# Patient Record
Sex: Female | Born: 1970 | Race: White | Hispanic: No | Marital: Single | Smoking: Never smoker
Health system: Southern US, Community
[De-identification: ages and names within clinical notes are randomized; demographics above are authoritative.]

## PROBLEM LIST (undated history)

## (undated) DIAGNOSIS — I2699 Other pulmonary embolism without acute cor pulmonale: Secondary | ICD-10-CM

## (undated) DIAGNOSIS — N83202 Unspecified ovarian cyst, left side: Secondary | ICD-10-CM

## (undated) DIAGNOSIS — K5190 Ulcerative colitis, unspecified, without complications: Secondary | ICD-10-CM

## (undated) DIAGNOSIS — I10 Essential (primary) hypertension: Secondary | ICD-10-CM

## (undated) DIAGNOSIS — J45909 Unspecified asthma, uncomplicated: Secondary | ICD-10-CM

## (undated) DIAGNOSIS — N83201 Unspecified ovarian cyst, right side: Secondary | ICD-10-CM

## (undated) DIAGNOSIS — F1910 Other psychoactive substance abuse, uncomplicated: Secondary | ICD-10-CM

## (undated) HISTORY — DX: Other pulmonary embolism without acute cor pulmonale: I26.99

---

## 1998-08-10 ENCOUNTER — Ambulatory Visit (HOSPITAL_COMMUNITY): Admission: RE | Admit: 1998-08-10 | Discharge: 1998-08-10 | Payer: Self-pay | Admitting: *Deleted

## 2001-05-14 ENCOUNTER — Emergency Department (HOSPITAL_COMMUNITY): Admission: EM | Admit: 2001-05-14 | Discharge: 2001-05-14 | Payer: Self-pay | Admitting: Emergency Medicine

## 2001-05-14 ENCOUNTER — Encounter: Payer: Self-pay | Admitting: Emergency Medicine

## 2001-10-02 ENCOUNTER — Emergency Department (HOSPITAL_COMMUNITY): Admission: EM | Admit: 2001-10-02 | Discharge: 2001-10-02 | Payer: Self-pay | Admitting: Internal Medicine

## 2001-10-02 ENCOUNTER — Encounter: Payer: Self-pay | Admitting: Internal Medicine

## 2001-12-18 ENCOUNTER — Encounter: Payer: Self-pay | Admitting: Emergency Medicine

## 2001-12-18 ENCOUNTER — Emergency Department (HOSPITAL_COMMUNITY): Admission: EM | Admit: 2001-12-18 | Discharge: 2001-12-18 | Payer: Self-pay | Admitting: *Deleted

## 2002-02-12 ENCOUNTER — Emergency Department (HOSPITAL_COMMUNITY): Admission: EM | Admit: 2002-02-12 | Discharge: 2002-02-12 | Payer: Self-pay | Admitting: *Deleted

## 2003-04-16 ENCOUNTER — Encounter: Payer: Self-pay | Admitting: *Deleted

## 2003-04-17 ENCOUNTER — Inpatient Hospital Stay (HOSPITAL_COMMUNITY): Admission: EM | Admit: 2003-04-17 | Discharge: 2003-04-19 | Payer: Self-pay | Admitting: Emergency Medicine

## 2003-04-18 ENCOUNTER — Encounter: Payer: Self-pay | Admitting: *Deleted

## 2003-08-29 ENCOUNTER — Emergency Department (HOSPITAL_COMMUNITY): Admission: EM | Admit: 2003-08-29 | Discharge: 2003-08-29 | Payer: Self-pay | Admitting: Emergency Medicine

## 2004-01-15 ENCOUNTER — Emergency Department (HOSPITAL_COMMUNITY): Admission: EM | Admit: 2004-01-15 | Discharge: 2004-01-15 | Payer: Self-pay | Admitting: Emergency Medicine

## 2004-02-25 ENCOUNTER — Emergency Department (HOSPITAL_COMMUNITY): Admission: EM | Admit: 2004-02-25 | Discharge: 2004-02-25 | Payer: Self-pay | Admitting: Emergency Medicine

## 2004-06-15 ENCOUNTER — Inpatient Hospital Stay (HOSPITAL_COMMUNITY): Admission: EM | Admit: 2004-06-15 | Discharge: 2004-06-18 | Payer: Self-pay | Admitting: Emergency Medicine

## 2004-11-27 ENCOUNTER — Emergency Department (HOSPITAL_COMMUNITY): Admission: EM | Admit: 2004-11-27 | Discharge: 2004-11-28 | Payer: Self-pay | Admitting: Emergency Medicine

## 2006-07-05 ENCOUNTER — Emergency Department (HOSPITAL_COMMUNITY): Admission: EM | Admit: 2006-07-05 | Discharge: 2006-07-05 | Payer: Self-pay | Admitting: Emergency Medicine

## 2007-05-07 ENCOUNTER — Emergency Department (HOSPITAL_COMMUNITY): Admission: EM | Admit: 2007-05-07 | Discharge: 2007-05-07 | Payer: Self-pay | Admitting: Emergency Medicine

## 2007-05-15 ENCOUNTER — Emergency Department (HOSPITAL_COMMUNITY): Admission: EM | Admit: 2007-05-15 | Discharge: 2007-05-15 | Payer: Self-pay | Admitting: Emergency Medicine

## 2008-09-09 ENCOUNTER — Emergency Department (HOSPITAL_COMMUNITY): Admission: EM | Admit: 2008-09-09 | Discharge: 2008-09-09 | Payer: Self-pay | Admitting: Emergency Medicine

## 2008-09-14 ENCOUNTER — Emergency Department (HOSPITAL_COMMUNITY): Admission: EM | Admit: 2008-09-14 | Discharge: 2008-09-14 | Payer: Self-pay | Admitting: Emergency Medicine

## 2008-12-16 ENCOUNTER — Observation Stay (HOSPITAL_COMMUNITY): Admission: EM | Admit: 2008-12-16 | Discharge: 2008-12-18 | Payer: Self-pay | Admitting: Emergency Medicine

## 2009-06-07 ENCOUNTER — Emergency Department (HOSPITAL_COMMUNITY): Admission: EM | Admit: 2009-06-07 | Discharge: 2009-06-07 | Payer: Self-pay | Admitting: Emergency Medicine

## 2009-06-28 ENCOUNTER — Emergency Department (HOSPITAL_COMMUNITY): Admission: EM | Admit: 2009-06-28 | Discharge: 2009-06-28 | Payer: Self-pay | Admitting: Emergency Medicine

## 2010-11-04 ENCOUNTER — Encounter: Payer: Self-pay | Admitting: Family Medicine

## 2011-01-18 LAB — BASIC METABOLIC PANEL
Chloride: 99 mEq/L (ref 96–112)
Creatinine, Ser: 0.78 mg/dL (ref 0.4–1.2)
Potassium: 3.9 mEq/L (ref 3.5–5.1)
Sodium: 139 mEq/L (ref 135–145)

## 2011-01-18 LAB — URINALYSIS, ROUTINE W REFLEX MICROSCOPIC
Glucose, UA: NEGATIVE mg/dL
Ketones, ur: NEGATIVE mg/dL
Protein, ur: NEGATIVE mg/dL
Specific Gravity, Urine: <1.005 — ABNORMAL LOW (ref 1.005–1.030)

## 2011-01-18 LAB — CBC
MCHC: 34.1 g/dL (ref 30.0–36.0)
RBC: 4.51 MIL/uL (ref 3.87–5.11)
RDW: 14.6 % (ref 11.5–15.5)
WBC: 7.1 10*3/uL (ref 4.0–10.5)

## 2011-01-18 LAB — URINE MICROSCOPIC-ADD ON

## 2011-01-18 LAB — ETHANOL: Alcohol, Ethyl (B): 160 mg/dL — ABNORMAL HIGH (ref 0–10)

## 2011-01-18 LAB — RAPID URINE DRUG SCREEN, HOSP PERFORMED: Amphetamines: NOT DETECTED

## 2011-01-18 LAB — DIFFERENTIAL
Basophils Absolute: 0 10*3/uL (ref 0.0–0.1)
Basophils Relative: 1 % (ref 0–1)
Eosinophils Absolute: 0.6 10*3/uL (ref 0.0–0.7)
Eosinophils Relative: 8 % — ABNORMAL HIGH (ref 0–5)
Lymphs Abs: 2.5 10*3/uL (ref 0.7–4.0)
Monocytes Absolute: 0.4 10*3/uL (ref 0.1–1.0)
Monocytes Relative: 6 % (ref 3–12)

## 2011-01-24 LAB — GLUCOSE, CAPILLARY
Glucose-Capillary: 194 mg/dL — ABNORMAL HIGH (ref 70–99)
Glucose-Capillary: 217 mg/dL — ABNORMAL HIGH (ref 70–99)
Glucose-Capillary: 236 mg/dL — ABNORMAL HIGH (ref 70–99)

## 2011-01-24 LAB — CBC
HCT: 36.1 % (ref 36.0–46.0)
Hemoglobin: 12.1 g/dL (ref 12.0–15.0)
MCHC: 33.3 g/dL (ref 30.0–36.0)
MCV: 84.1 fL (ref 78.0–100.0)
MCV: 84.7 fL (ref 78.0–100.0)
RBC: 4.38 MIL/uL (ref 3.87–5.11)
RBC: 4.4 MIL/uL (ref 3.87–5.11)
RDW: 15.6 % — ABNORMAL HIGH (ref 11.5–15.5)
RDW: 15.8 % — ABNORMAL HIGH (ref 11.5–15.5)
RDW: 15.9 % — ABNORMAL HIGH (ref 11.5–15.5)
WBC: 27.5 10*3/uL — ABNORMAL HIGH (ref 4.0–10.5)
WBC: 9.5 10*3/uL (ref 4.0–10.5)

## 2011-01-24 LAB — BASIC METABOLIC PANEL
BUN: 5 mg/dL — ABNORMAL LOW (ref 6–23)
BUN: 7 mg/dL (ref 6–23)
Calcium: 8.9 mg/dL (ref 8.4–10.5)
Chloride: 98 mEq/L (ref 96–112)
Chloride: 99 mEq/L (ref 96–112)
Creatinine, Ser: 0.59 mg/dL (ref 0.4–1.2)
Creatinine, Ser: 0.73 mg/dL (ref 0.4–1.2)
GFR calc Af Amer: >60 mL/min (ref >60)
GFR calc non Af Amer: >60 mL/min (ref >60)
Glucose, Bld: 172 mg/dL — ABNORMAL HIGH (ref 70–99)
Glucose, Bld: 181 mg/dL — ABNORMAL HIGH (ref 70–99)
Potassium: 3.6 mEq/L (ref 3.5–5.1)
Potassium: 3.9 mEq/L (ref 3.5–5.1)
Potassium: 4.7 mEq/L (ref 3.5–5.1)
Sodium: 137 mEq/L (ref 135–145)

## 2011-01-24 LAB — DIFFERENTIAL
Band Neutrophils: 2 % (ref 0–10)
Basophils Absolute: 0 10*3/uL (ref 0.0–0.1)
Basophils Absolute: 0 10*3/uL (ref 0.0–0.1)
Basophils Relative: 0 % (ref 0–1)
Eosinophils Absolute: 0 10*3/uL (ref 0.0–0.7)
Eosinophils Absolute: 0 10*3/uL (ref 0.0–0.7)
Eosinophils Relative: 0 % (ref 0–5)
Eosinophils Relative: 1 % (ref 0–5)
Lymphocytes Relative: 3 % — ABNORMAL LOW (ref 12–46)
Metamyelocytes Relative: 0 %
Monocytes Absolute: 0.1 10*3/uL (ref 0.1–1.0)
Monocytes Absolute: 0.2 10*3/uL (ref 0.1–1.0)
Monocytes Absolute: 1.1 10*3/uL — ABNORMAL HIGH (ref 0.1–1.0)
Monocytes Relative: 4 % (ref 3–12)
Neutro Abs: 13.3 10*3/uL — ABNORMAL HIGH (ref 1.7–7.7)
Neutro Abs: 9.1 10*3/uL — ABNORMAL HIGH (ref 1.7–7.7)
Neutrophils Relative %: 95 % — ABNORMAL HIGH (ref 43–77)

## 2011-01-24 LAB — HEMOGLOBIN A1C
Hgb A1c MFr Bld: 5.9 % (ref 4.6–6.1)
Mean Plasma Glucose: 123 mg/dL

## 2011-02-26 NOTE — Discharge Summary (Signed)
Melanie Mendoza, Melanie Mendoza                 ACCOUNT NO.:  [id number]   MEDICAL RECORD NO.:  [id number]          PATIENT TYPE:  OBV   LOCATION:  A318                          FACILITY:  [facility]   PHYSICIAN:  Osvaldo Shipper, MD     DATE OF BIRTH:  18-Feb-1971   DATE OF ADMISSION:  12/16/2008  DATE OF DISCHARGE:  03/07/2010LH                               DISCHARGE SUMMARY   PRIVATE MEDICAL DOCTOR:  Dr. Reynolds Bowl   DISCHARGE DIAGNOSES:  1. Acute asthma exacerbation, improved.  2. History of chronic back pains.  3. Anxiety disorder.   Please review H&P dictated at the time of admission for details  regarding the patient's presenting illness.   BRIEF HOSPITAL COURSE:  Briefly, this is a 39 year old Caucasian female  who was visiting family and friends and was exposed to a lot of  cigarette smoking and kerosine fumes and, as a result, started feeling  short of breath and started wheezing and developed severe cough.  The  patient was evaluated in the ED and was found to be significantly  wheezing and was hypoxic in the 80s.  Chest x-ray did not show any acute  abnormalities, but did show evidence of COPD and emphysema.  She was  given nebulizer treatments, steroids, antibiotics, antitussive agents  and has significantly improved.  Her peak flows initially were 100 and  is now they are about 250.  Her lungs are sounding much better.  She is  oxygenating about 93% on room air.  She is feeling much better  symptomatically.   Her CBG's did go up while she was on the steroids to about 257.  Her  initial lab showed a glucose of 172 but that was also done after she was  given steroids.  I have explained to her that she needs to have blood  work done off her steroids to make sure she does not have diabetes or  early diabetes.   On the day of discharge, the patient is feeling well.  No complaints are  offered cough is better.  Vital signs are all stable.  Lungs: Clear to  auscultation bilaterally.   Cardiovascular system:  S1 and S2 normal,  regular.  Abdomen: Soft.   Labs are still pending from this morning, so once labs come back okay  she can be discharged home.   DISCHARGE MEDICATIONS:  1. Prednisone 40 mg daily for 3 days followed by 20 mg daily for 3      days, followed by 10 mg daily for 3 days.  2. Zithromax 500 mg daily for 3 more days.  3. Tussionex 5 cc q.12 hours for 5 days.  4. Continue with albuterol inhaler as needed q.4 hours.  5. Continue with Percocet t.i.d. for chronic back pain.   FOLLOW UP:  With Dr. Jorene Guest in 1 week.  Also have PMD check blood  sugars off the steroids.   DIET:  As before.   PHYSICAL ACTIVITY:  As before.   CONSULTATIONS:  None obtained during this admission.   Total time on this discharge encounter greater than 30 minutes.  Osvaldo Shipper, MD  Electronically Signed     GK/MEDQ  D:  12/18/2008  T:  12/18/2008  Job:  [id number]   cc:   Reynolds Bowl, MD  [place], [place]

## 2011-02-26 NOTE — H&P (Signed)
Melanie Mendoza, Melanie Mendoza                 ACCOUNT NO.:  [id number]   MEDICAL RECORD NO.:  [id number]          PATIENT TYPE:  OBV   LOCATION:  A318                          FACILITY:  [facility]   PHYSICIAN:  Osvaldo Shipper, MD     DATE OF BIRTH:  May 14, 1971   DATE OF ADMISSION:  12/16/2008  DATE OF DISCHARGE:  LH                              HISTORY & PHYSICAL   PRIMARY CARE PHYSICIAN:  Dr. Jorene Guest.   ADMISSION DIAGNOSES:  1. Acute asthma exacerbation.  2. History of chronic back pain.  3. History of anxiety disorder.   CHIEF COMPLAINT:  Shortness of breath for the past two days.   HISTORY OF PRESENT ILLNESS:  The patient is a 40 year old Caucasian  female who has a history of asthma, chronic back pain, who was in her  usual state of health until about 2 days ago when she started feeling  short of breath.  She is currently staying in [place] and she was  visiting her other family members here in [place] and she was exposed  to cigarette smoke and kerosene stoves and as a result, she started  getting short of breath.  The symptoms got worse yesterday but she  decided not to come in to the hospital.  She was wheezing, experiencing  chest tightness, coughing quite a bit with clear expectoration.  Today  the symptoms got to a point where she decided to come into  the  hospital.  Denied any fever or chills.  No specific chest pain. Chest  tightness because of cough and shortness of breath.  She denies smoking  at this time.  Denies any nausea or vomiting.   No sick contacts. No travel outside this area.   HOME MEDICATIONS:  1. Percocet 7.5 t.i.d..  2. Albuterol inhaler as needed.  3. She used to be on Xanax but her current PMD has not prescribed it      to her in awhile.   ALLERGIES:  No known drug allergies.   PAST MEDICAL HISTORY:  1. Asthma.  2. Chronic back pain.  3. Anxiety attacks.   PAST SURGICAL HISTORY:  1. Dilatation and curettage for miscarriage.  2. Abscess on her  buttocks.   SOCIAL HISTORY:  Lives in [place]. No smoking or alcohol on a regular  basis. No illicit drug use.  She does drink occasionally.  Works  delivering parts for automobiles.  Otherwise no other significant social  history.   FAMILY HISTORY:  Father with lung cancer.   REVIEW OF SYSTEMS:  GENERAL:  Positive for weakness, malaise. HEENT:  Unremarkable. CARDIOVASCULAR:  Unremarkable.  RESPIRATORY:  As in HPI.  GI: Unremarkable.  GU: Unremarkable.  NEUROLOGIC: Unremarkable.  PSYCHIATRIC:  Unremarkable.  DERMATOLOGIC:  Unremarkable.  MUSCULOSKELETAL: Unremarkable.  Other systems  unremarkable.   PHYSICAL EXAMINATION:  VITAL SIGNS:  Temperature 97.8, blood pressure  123/66, heart rate 95, respiratory rate initially 30, currently 20.  Room air saturation 86%, 94% on 2 L.  GENERAL:  This an overweight, obese, white female in no distress.  HEENT:  No pallor, no icterus.  Oral  mucous membranes are moist.  No  oral lesions were seen.  NECK:  Soft and supple. No thyromegaly is appreciated.  LUNGS:  Diffuse e0nd-expiratory wheezes bilaterally.  No crackles  present.  CARDIOVASCULAR:  S1 and S2 normal, regular.  No murmurs appreciated.  No  S3 or S4 seen.  No rubs, no bruits.  ABDOMEN:  Soft, nontender, nondistended.  Bowel sounds present. No  masses or organomegaly appreciated.  EXTREMITIES:  No edema.  Peripheral pulses are palpable.  NEUROLOGIC:  She is alert, oriented x3.  No focal neurological deficits  are present.   LABORATORY DATA:  No labs have been done.  Chest x-ray shows evidence  for emphysema, prominent central pulmonary arteries.  May reflect  pulmonary artery hypertension.   ASSESSMENT:  This is a 40 year old Caucasian female who presents with  acute asthma exacerbation secondary to exposure to cigarette smoke.  She  is improved after treatment in the ED.  However, continuous to be  hypoxic and still wheezing quite a bit and needs to be observed in the  hospital  for some intensive steroids as well as nebulizer treatment.   PLAN:  1. Acute asthma exacerbation. Will be admitted, nebulizer q.4 h.      Steroids will be given IV. Z-pak will be provided. Peak flows will      be checked.  Since we do not have any labs, we will go ahead and      check a CBC and a BMET.  At this time we do not need an ABG.  Room      air saturation will be checked on a daily basis.  CBGs will be      checked while she is on the steroids.  Tessalon Perles will be      utilized for cough.  Chest tightness is most likely the result of      asthma.  2. Chronic back pain.  Continue with Percocet.  3. Anxiety disorder.  Continue with Xanax.  She is requesting an agent      for insomnia so we will go ahead and      prescribe Ambien.  4. Deep venous thrombosis prophylaxis with Lovenox.   Further management will depend on the results of further testing and the  patient's response to treatment.      Osvaldo Shipper, MD  Electronically Signed     GK/MEDQ  D:  12/16/2008  T:  12/17/2008  Job:  [id number]   cc:   Dr. Jorene Guest

## 2011-03-01 NOTE — H&P (Signed)
NAME:  Melanie Mendoza, Melanie Mendoza NO.:  [id number]   MEDICAL RECORD NO.:  [id number]                  PATIENT TYPE:   LOCATION:                                       FACILITY:   PHYSICIAN:  Langley Gauss, M.D.                DATE OF BIRTH:   DATE OF ADMISSION:  04/17/2003  DATE OF DISCHARGE:                                HISTORY & PHYSICAL   HISTORY OF PRESENT ILLNESS:  This is a 40 year old gravida 2, para 2, two  prior vaginal deliveries with a last menstrual period of mid-May who  presented to the emergency room complaining of a three-day duration of  pelvic pain primarily on the left but acutely increasing at 1300 on today's  date with a generalization towards the midepigastric area.  The patient knew  that she was late for her menses which were described as further  unpredictable.  She is not on any birth control.  However, she was unaware  and only had a positive pregnancy test just on today's visit, April 17, 2003.  When the patient initially presented to the emergency room according to the  triage record, she described the pain as 10/10.  When I was consulted to see  her after having been seen by Dr. Ernestina Penna, the patient had been treated with  IV morphine with marked improvement in her pain.  She denied any vaginal  bleeding or change in her vaginal discharge.  Has not been on any birth  control.  She denies any drug or alcohol use during this pregnancy.   PAST MEDICAL HISTORY:  The patient does have history of panic attacks,  asthmatic bronchitis.  She is a nonsmoker.  Has previously been treated with  a Proventil inhaler.  Review of the patient's previous medical record in our  office reveals that during her last pregnancy six years previously, the  patient repeatedly tested positive for cocaine, THC, opiates, and  occasionally amphetamines.  Medical history is pertinent for two prior  vaginal deliveries.   ALLERGIES:  She has no known drug  allergies.   CURRENT MEDICATIONS:  The patient states none.   PHYSICAL EXAMINATION:  GENERAL:  In no acute distress.  Appears much greater  than stated age of 20.  VITAL SIGNS:  Weight 135, blood pressure 126/83, pulse of 96, respiratory  rate 20, temperature 99.5.  HEENT:  Negative.  No adenopathy.  NECK:  Supple.  Thyroid is nonpalpable.  LUNGS:  Clear.  CARDIOVASCULAR:  Regular rate and rhythm.  ABDOMEN:  Soft, nontender, nondistended.  No peritoneal signs are  identified.  No surgical scars are seen.  PELVIC:  Reveals a brownish discharge only.  No lesions on the external  vulva.  No definite vaginal bleeding noted.  Bimanual examination reveals  moderate tenderness both adnexal region as well as within the manipulation  of the uterus itself.   LABORATORY DATA:  A urinalysis is unremarkable.  Hemoglobin 12.9, hematocrit  37.2, white count 8.6.  hCG quantitative is 1207.  Electrolytes:  Sodium  132, potassium 3.2, chloride 103, CO2 29.  Urine drug screen is positive for  cocaine, positive for opiates, positive for benzodiazepines.  Transvaginal  ultrasound performed in Department of Radiology reveals no intrauterine  pregnancy identified.  There is a moderate amount of free fluid seen within  the pelvis.  The right ovary is interpreted as being normal and the left  ovary is identified as a left ovarian cyst at 5.8 cm in diameter.   ASSESSMENT:  Probable ectopic pregnancy and most pertinent findings being  that of quantitative hCG of 1207, marked pelvic pain acutely worsening, and  the ultrasound which fails to reveal an intrauterine pregnancy.  Pertinent,  however, is the free fluid identified on the ultrasound which probably  represents intraperitoneal bleeding.  The left ovarian cyst appears benign  in appearance being 5.8 cm in diameter with no complex characteristics, no  echogenicities, and no septations.  This may or not be associated with the  presumed ectopic pregnancy  but the other findings are much more conclusive.  Pertinently, the absence of an intrauterine pregnancy identified.   PLAN:  The patient is clinically stable at present.  She has had complete  cessation of pain.  Hemodynamically stable with a hemoglobin of 12.9.  It is  now April 18, 2003 at 0030 hours.  It is in the best interest of the patient  to have completion of workup and paperwork performed at this time and the  laparoscopic excision of ectopic pregnancy found to be performed at 0700.  Risks and benefits of the laparoscopic procedure discussed with the patient.  She understands that there is the possibility of removal of fallopian tube  and/or ovary dependant on clinical findings.  The patient does state a  desire to have her tubes tied.  However, I mentioned to her that this is not  to be performed during an emergency procedure nor has she made the  appropriate preoperative counseling visitations for this required procedure,  and if she does qualify for Medicaid there would be a mandatory 30-day  waiting period.  She is, however, certain of her desire to have no future  childbearing opportunities.                                               Langley Gauss, M.D.    DC/MEDQ  D:  04/18/2003  T:  04/18/2003  Job:  [id number]

## 2011-03-01 NOTE — Op Note (Signed)
NAME:  Melanie Mendoza, Melanie Mendoza                           ACCOUNT NO.:  [id number]   MEDICAL RECORD NO.:  [id number]                   PATIENT TYPE:  PINP   LOCATION:                                       FACILITY:  [facility]   PHYSICIAN:  Langley Gauss, M.D.                DATE OF BIRTH:  04-22-1971   DATE OF PROCEDURE:  04/17/2003  DATE OF DISCHARGE:                                 OPERATIVE REPORT   PREOPERATIVE DIAGNOSIS:  Ruptured ectopic pregnancy.   POSTOPERATIVE DIAGNOSIS:  Ruptured ectopic pregnancy, specifically findings  of mid ampullary portion right fallopian tube, ectopic pregnancy with  evidence of a small amount of previous bleeding.  In addition a 6 cm  follicular cyst identified within the left ovary.  The uterus itself is  noted to be diffusely enlarged consistent with multiparous patient.   PROCEDURE:  1. Laparoscopy with right salpingectomy.  2. Right ovarian cystectomy.  3. Left ovarian cystectomy.   SURGEON:  Langley Gauss, M.D.   ESTIMATED BLOOD LOSS:  Less than 100 ml.   SPECIMENS:  Specimens for a permanent section only to include cyst wall,  left and right ovary as well as right fallopian tube containing ectopic  pregnancy.   ANESTHESIA:  General endotracheal.   COMPLICATIONS:  None.   DESCRIPTION OF PROCEDURE:  The patient was taken from the fourth floor to  the operating room.  Vital signs were stable.  She underwent uncomplicated  induction of general endotracheal anesthesia.  She was then placed in the  dorsal lithotomy position, prepped and draped in the usual sterile manner.  Foley catheter had been previously placed to straight drainage.  A ring  forceps with a sponge stick and 4 x 4 attached was then placed into the  vaginal vault for manipulation.  I then changed to sterile gloves and on the  patient's left side a 1 cm vertical incision was made just superior to the  umbilicus.  In addition, a suprapubic midline incision was performed.  The  abdominal wall was then elevated.  The Veress needle was then passed through  the subumbilical incision angling perpendicular to the fascia plane to  atraumatically enter the peritoneal cavity.  Drop test then confirms a  proper intraperitoneal placement.  Pneumoperitoneum is then created by  insufflating 3.5 liters of CF2 gas at a filling pressure of less than 16  mmHg.  After pneumoperitoneum Veress needle was removed.  The Visiport 10 mm  trocar and sleeve are threaded through this subumbilical incision.  The  abdominal wall is elevated.  Under direct visualization, I was able to  easily and atraumatically enter the peritoneal cavity.  The scope was then  connected to video camera which allowed visualization of the pelvic  contents.  As stated previously, about 50 ml of clotted, non clotted blood  was noted within the posterior cul-de-sac.  Right ectopic pregnancy as  stated previously.  As stated previously, left tube normal in appearance,  bilateral ovarian cysts.  The patient had previously stated a desire for  permanent sterilization.  In addition, the right fallopian tube had more  than 1/2 of the tube involved in the ectopic pregnancy.  Thus decision was  made proceed with a left salpingectomy.  The right ovary was grasped with a  Babcock clamp and elevated which allowed completely visualization and  manipulation of the right fallopian tube as well as the ectopic pregnancy.  This then allows me to use the 45 mm standard Endo-GIA clamp to clamp the  medial salpinx which then allows with a single firing of the Endo-GIA to  completely excise the right fallopian tube and associated ectopic pregnancy.  During manipulation of the right ovary, rupture of the right ovarian cyst  was noted to occur, portions which were then sent for permanent section  only.  The vascular pedicle was examined.  There is noted to be a small  pulsatile bleeder noted along the staple line.  This was easily  cauterized  utilizing bipolar cautery to achieve hemostasis.  The specimen was then to  be removed.  A 12 mm trocar and sleeve had been inserted through a third  puncture site to the left of the midline under direct visualization.  An  Endo-catch was passed through this third trocar site which allowed the  specimen not to be passed into the sac and completely removed as a single  unit.  This was then sent off for a permanent specimen only.  The right  ovary was then examined.  A small amount of active bleeding at the ovarian  cyst capsule at the level of the cyst removal were noted to be bleeding.  These were easily controlled utilizing bipolar cautery.  I then turned my  attention to the left adnexa to see the large size of the left follicular  cyst estimated to be about 6 cm in size and no active bleeding was  presented.  I decided to remove this left ovarian cyst.  Cauterization was  then performed utilizing unipolar Endoshears over the ovarian capsule which  then allowed me to identify the underlying cyst wall which was then  dissected free from the underlying capsule and most of the ovarian cyst wall  was then excised and sent off for permanent specimen only.  Bipolar cautery  is performed of the cyst capsulotomy left to achieve hemostasis.  Irrigation  was performed of the pelvic cavity to assure hemostasis.  No active bleeding  is noted.  A piece of Surgicel oil and cloth is placed around the right  ovary as well as the left ovary in an effort to prevent postoperative aging  formation.  The procedure was then terminated.  Gas was allowed to escape.  All instruments are removed.  The incisions are closed.  A 2-0 Vicryl suture  was used to close the fascia of the 12 mm trocar site on the left.  In  addition, two interrupted sutures of 2-0 Vicryl were placed subumbilically  to reapproximate the skin edges here.  The second and third puncture sites were then completely closed utilizing  skin staples.  A total of 20 ml of  0.5% bupivacaine was then injected subcutaneously through a small skin wheal  to facilitate postoperative analgesia.  The patient was reversed of  anesthesia and taken to the recovery room in stable condition.  A Foley  catheter continues to drain clear yellow  urine.  The patient continues to  have stable vital signs.  Operative findings were discussed with the  patient's awaiting family to include the fact that the right ovary had been  removed due to the large size of the ectopic pregnancy.  The patient will be  referred to the fourth floor, continued on an observation status with  planned probable discharge on April 18, 2003.                                               Langley Gauss, M.D.    DC/MEDQ  D:  04/17/2003  T:  04/17/2003  Job:  [id number]

## 2011-03-01 NOTE — Discharge Summary (Signed)
NAME:  TANETTE, CHAUCA                           ACCOUNT NO.:  [id number]   MEDICAL RECORD NO.:  [id number]                   PATIENT TYPE:  INP   LOCATION:  A319                                 FACILITY:  [facility]   PHYSICIAN:  Dalia Heading, M.D.               DATE OF BIRTH:  23-Nov-1970   DATE OF ADMISSION:  06/15/2004  DATE OF DISCHARGE:  06/18/2004                                 DISCHARGE SUMMARY   HISTORY OF PRESENT ILLNESS:  The patient is a 40 year old white female who  presented to the emergency room with a peri-rectal abscess. A surgical  consultation was obtained and the patient was admitted to the hospital for  further evaluation and treatment. She underwent incision and drainage of the  peri-rectal abscess on June 16, 2004. She tolerated the procedure well.  Postoperative course has been remarkable only for adjusting pain  medications. Her white blood cell count has returned to normal. Culture  results are still pending.   DISPOSITION:  The patient is being discharged home on postoperative day 2 in  good and stable condition.   FOLLOW UP:  The patient is to followup with Dr. Franky Macho on June 21, 2004.   DISCHARGE MEDICATIONS:  1.  Restoril 30 mg p.o. q.h.s. p.r.n. insomnia.  2.  Lortab and Flagyl from my office.   SPECIAL INSTRUCTIONS:  She is to soak in a tub twice a day.   PRINCIPAL DIAGNOSIS:  Peri-rectal abscess.   PRINCIPAL PROCEDURE:  Incision and drainage of peri-rectal abscess on  June 16, 2004.     ___________________________________________                                         Dalia Heading, M.D.   MAJ/MEDQ  D:  06/18/2004  T:  06/18/2004  Job:  [id number]

## 2011-03-01 NOTE — Discharge Summary (Signed)
NAME:  Melanie Mendoza, Melanie Mendoza                           ACCOUNT NO.:  [id number]   MEDICAL RECORD NO.:  [id number]                   PATIENT TYPE:  INP   LOCATION:  A417                                 FACILITY:  [facility]   PHYSICIAN:  Langley Gauss, M.D.                DATE OF BIRTH:  21-Mar-1971   DATE OF ADMISSION:  04/16/2003  DATE OF DISCHARGE:  04/19/2003                                 DISCHARGE SUMMARY   DIAGNOSES:  1. Ruptured right ampullary ectopic pregnancy.  2. A 6 cm follicular cyst, left ovary.   PROCEDURE PERFORMED:  Laparoscopy with:  1. Right salpingectomy with removal ectopic pregnancy.  2. Left ovarian cystectomy.  3. Right ovarian cystectomy.   DISPOSITION:  At time of discharge the patient does have staples in place at  the laparoscopic site.  She will follow up in the office in two days time  for staple removal.  She is given a copy of standardized discharge  instructions at time of discharge.   DISCHARGE MEDICATIONS:  1. Tylox #30 with no refill for pain relief.  2. In addition, the patient is to continue with Keflex 500 mg p.o. q.i.d. x5     days for a left lower jaw problem with tooth decay.   PERTINENT LABORATORY STUDIES:  O positive blood type.  Hemoglobin and  hematocrit 12.9/37.2 with a white count of 8.6.  Postoperative day #1,  hemoglobin 10.7, hematocrit 30.7, with a white count of 10.0.  Pertinently,  on the patient's initial urine drug screen obtained in the emergency room  for medical purposes, she was noted to test positive for cocaine, positive  for benzodiazepines, and positive for opiates.  Additional studies:  The  patient did have a chest x-ray obtained postoperatively for findings of  diffuse rhonchi; chest x-ray was interpreted as normal.  Ultrasound obtained  by the emergency room physician on April 16, 2003 revealed a 5.8 cm cyst on  the left ovary; the right ovary was interpreted as normal; free fluid within  the pelvis; no intrauterine  pregnancy identified.  Quantitative beta hCG  returned at 1207.   HOSPITAL COURSE:  The patient presented to the [facility] emergency room on  April 16, 2003 with new finding of a positive pregnancy test, left lower  quadrant pain, and the ultrasound as described previously.  The patient was  initially seen by Dr. Ernestina Penna, the emergency room physician, at which time I  was consulted for ongoing care and management.  I evaluated the patient the  late p.m. of April 16, 2003 continuing into the early a.m. of April 17, 2003.  My diagnosis was that of probable ectopic pregnancy.  The patient was thus  taken to the operating room on April 17, 2003 at which time the described  operative laparoscopy was performed without complications.  A right  salpingectomy was performed as well as left and right  ovarian cystectomies.  Postoperatively the patient did well.  She had a Foley catheter in place  following surgery.  The Foley catheter was actually removed the p.m. of  surgery.  She had excellent urine output.  Vital signs remained stable.  The  patient was very bloated with no passage of flatus.  On postoperative day  #1 the patient became increasingly bloated with no passage of gas.  She did,  however, have bowel sounds.  She continued on the IV Buprenex for pain  relief and this did not change until the a.m. of postoperative day #2 at  which time the patient was changed to p.o. Tylox for pain relief.  She did  well on the p.o. Tylox.  She did have a Tmax of 102.8 the afternoon of  postoperative day #1.  White count was normal at 10.0.  Physical examination  was pertinent for the diffuse rhonchi in the lungs as well as a small  abscess in the left lower jaw associated with tooth decay, and some bruising  on the abdomen consistent with a probable limited subcutaneous hematoma  within the subcutaneous fatty tissue.  The patient was started on IV  Rocephin at that time.  She would have received two doses of IV  Rocephin  prior to discharge.  The patient defervesced very rapidly on this.  On  evaluation on postoperative day #2 she was fully ambulatory, tolerating a  regular general diet, passing flatus, remained afebrile.  Lungs were clear  to auscultation.  She was liberally using the Proventil inhaler which had  been prescribed for her.  The patient was thus discharged to home on  postoperative day #2 in a markedly-improved condition.  The final pathology  is currently pending and will be discussed with the patient at the  postoperative visit.                                               Langley Gauss, M.D.    DC/MEDQ  D:  04/19/2003  T:  04/19/2003  Job:  [id number]

## 2011-03-01 NOTE — H&P (Signed)
NAME:  Melanie Mendoza, Melanie Mendoza                           ACCOUNT NO.:  [id number]   MEDICAL RECORD NO.:  [id number]                   PATIENT TYPE:  INP   LOCATION:  A319                                 FACILITY:  [facility]   PHYSICIAN:  Dalia Heading, M.D.               DATE OF BIRTH:  [birth year]   DATE OF ADMISSION:  06/15/2004  DATE OF DISCHARGE:                                HISTORY & PHYSICAL   REASON FOR ADMISSION:  Perirectal cellulitis.   HISTORY OF PRESENT ILLNESS:  The patient is a 40 year old white female who  presents with a 24-hour history of worsening left buttock pain.  She  presented to the emergency room for further evaluation and treatment.   PAST MEDICAL HISTORY:  Asthma.   PAST SURGICAL HISTORY:  Unremarkable.   CURRENT MEDICATIONS:  Albuterol, metered dose.   ALLERGIES:  No known drug allergies.   REVIEW OF SYSTEMS:  Noncontributory other than the patient does smoke.   PHYSICAL EXAMINATION:  GENERAL: The patient is a well-developed, well-  nourished white female who is in pain from the left buttock.  She is  afebrile.  VITAL SIGNS: Stable.  LUNGS:  Clear to auscultation with equal breath sounds bilaterally.  HEART:  Examination reveals a regular rate and rhythm without S3, S4 or  murmurs.  RECTAL:  Examination reveals a left buttock which is indurated and  erythematous.  There is no specific fluctuance noted.  This does not at this  time appear to be connected to the rectum, though it is inferior from the  pilonidal cyst region.  No drainage is noted.   LABORATORY DATA:  Pending.   IMPRESSION:  Left buttocks cellulitis, question early abscess.   PLAN:  1.  The patient will be admitted to the hospital for intravenous antibiotics      and pain control.  She may require incision and drainage of this abscess      once it is better demarcated.     ___________________________________________                                         Dalia Heading, M.D.   MAJ/MEDQ  D:  06/15/2004  T:  06/15/2004  Job:  [id number]

## 2011-03-01 NOTE — Op Note (Signed)
NAME:  Melanie Mendoza, Melanie Mendoza                           ACCOUNT NO.:  [id number]   MEDICAL RECORD NO.:  [id number]                   PATIENT TYPE:  INP   LOCATION:  A319                                 FACILITY:  [facility]   PHYSICIAN:  Dalia Heading, M.D.               DATE OF BIRTH:  [birth year]   DATE OF PROCEDURE:  06/16/2004  DATE OF DISCHARGE:                                 OPERATIVE REPORT   PREOPERATIVE DIAGNOSIS:  Perirectal abscess.   POSTOPERATIVE DIAGNOSIS:  Perirectal abscess.   PROCEDURE:  Incision and drainage of perirectal abscess.   SURGEON:  Dr. Franky Macho   ANESTHESIA:  General.   INDICATIONS:  The patient is a 40 year old white female, who presented  yesterday evening to the emergency room with a perirectal abscess.  She does  have a leukocytosis.  She now presents to the operating room for an incision  and drainage of a perirectal abscess.  The risks and benefits of the  procedure were fully explained to the patient, who gave informed consent.   PROCEDURE NOTE:  The patient was placed in the Trendelenburg position after  general anesthesia was administered.  The perineum was prepped and draped  using the usual sterile technique with Betadine.  Surgical site confirmation  was performed.   An incision was made into the abscess which was greater than 3 cm from the  anus.  An abscess pocket was found.  Aerobic and anaerobic cultures were  taken and sent to microbiology.  The wound was irrigated with normal saline.  The mucosa of the rectum was inflamed, but no direct fistulous tract was  noted.  Hemorrhoidal disease was present diffusely.  The wound, which was  along the left perirectal area at the 5 o'clock position was packed with  Betadine and impregnated packing.  All tape and needle counts correct at the  end of the procedure.  The patient tolerated the procedure well and was  transferred back to PACU in stable condition.  Complications none.  Specimen:  Aerobic  and anaerobic cultures.  Blood loss minimal.      ___________________________________________                                            Dalia Heading, M.D.   MAJ/MEDQ  D:  06/16/2004  T:  06/17/2004  Job:  [id number]

## 2012-07-06 ENCOUNTER — Emergency Department (HOSPITAL_COMMUNITY): Payer: Self-pay

## 2012-07-06 ENCOUNTER — Encounter (HOSPITAL_COMMUNITY): Payer: Self-pay | Admitting: Emergency Medicine

## 2012-07-06 ENCOUNTER — Emergency Department (HOSPITAL_COMMUNITY)
Admission: EM | Admit: 2012-07-06 | Discharge: 2012-07-06 | Disposition: A | Discharge status: Home / Self Care | Payer: Self-pay | Attending: Emergency Medicine | Admitting: Emergency Medicine

## 2012-07-06 DIAGNOSIS — S161XXA Strain of muscle, fascia and tendon at neck level, initial encounter: Secondary | ICD-10-CM

## 2012-07-06 DIAGNOSIS — S0003XA Contusion of scalp, initial encounter: Secondary | ICD-10-CM | POA: Insufficient documentation

## 2012-07-06 DIAGNOSIS — S0083XA Contusion of other part of head, initial encounter: Secondary | ICD-10-CM

## 2012-07-06 DIAGNOSIS — S9031XA Contusion of right foot, initial encounter: Secondary | ICD-10-CM

## 2012-07-06 DIAGNOSIS — S9030XA Contusion of unspecified foot, initial encounter: Secondary | ICD-10-CM | POA: Insufficient documentation

## 2012-07-06 DIAGNOSIS — W108XXA Fall (on) (from) other stairs and steps, initial encounter: Secondary | ICD-10-CM | POA: Insufficient documentation

## 2012-07-06 DIAGNOSIS — S139XXA Sprain of joints and ligaments of unspecified parts of neck, initial encounter: Secondary | ICD-10-CM | POA: Insufficient documentation

## 2012-07-06 HISTORY — DX: Unspecified asthma, uncomplicated: J45.909

## 2012-07-06 MED ORDER — HYDROMORPHONE HCL PF 1 MG/ML IJ SOLN
1.0000 mg | Freq: Once | INTRAMUSCULAR | Status: AC
  Administered 2012-07-06: 1 mg via INTRAMUSCULAR
  Filled 2012-07-06: qty 1

## 2012-07-06 MED ORDER — OXYCODONE-ACETAMINOPHEN 5-325 MG PO TABS
2.0000 | ORAL_TABLET | Freq: Once | ORAL | Status: AC
  Administered 2012-07-06: 2 via ORAL
  Filled 2012-07-06: qty 2

## 2012-07-06 MED ORDER — OXYCODONE-ACETAMINOPHEN 5-325 MG PO TABS: 1.0000 | ORAL_TABLET | ORAL | Status: DC | PRN

## 2012-07-06 MED ORDER — IBUPROFEN 600 MG PO TABS: 600.0000 mg | ORAL_TABLET | Freq: Four times a day (QID) | ORAL | Status: DC | PRN

## 2012-07-06 MED ORDER — KETOROLAC TROMETHAMINE 60 MG/2ML IM SOLN
60.0000 mg | Freq: Once | INTRAMUSCULAR | Status: AC
  Administered 2012-07-06: 60 mg via INTRAMUSCULAR
  Filled 2012-07-06: qty 2

## 2012-07-06 NOTE — ED Notes (Signed)
In xray

## 2012-07-06 NOTE — ED Notes (Signed)
Pt c/o right ankle pain/head pain. Pt stated she fell down a flight of stairs last night after etoh consumption. Pt states she cant put any weight on right ankle.

## 2012-07-09 NOTE — ED Provider Notes (Signed)
History     CSN: [id number]  Arrival date & time 07/06/12  1037   First MD Initiated Contact with Patient 07/06/12 1113      Chief Complaint  Patient presents with  . Ankle Pain    (Consider location/radiation/quality/duration/timing/severity/associated sxs/prior treatment) HPI Comments: Melanie Mendoza presents for evaluation of multiple injuries she sustained in a fall last night while intoxicated. She reports attending a birthday party,  Becoming intoxicated and fell down a flight of steps.  She denies loc during the event.  She injured her right foot and has not been able to bear weight on it since waking today.  Additionally,  She sustained injury to her left cheek and has noticed soreness in her neck as well since the accident.  She denies numbness in her right toes.  She has a moderate headache, but denies confusion, visual changes,  Dizziness and focal weakness.  She has take no medicines prior to arrival today.  The history is provided by the patient.    Past Medical History  Diagnosis Date  . Asthma     History reviewed. No pertinent past surgical history.  History reviewed. No pertinent family history.  History  Substance Use Topics  . Smoking status: Never Smoker   . Smokeless tobacco: Not on file  . Alcohol Use: 2.4 oz/week    2 Cans of beer, 2 Shots of liquor per week    OB History    Grav Para Term Preterm Abortions TAB SAB Ect Mult Living            2      Review of Systems  HENT: Positive for neck pain. Negative for neck stiffness.   Eyes: Negative for photophobia, pain and visual disturbance.  Musculoskeletal: Positive for joint swelling and arthralgias.  Skin: Negative for wound.  Neurological: Positive for headaches. Negative for speech difficulty, weakness, light-headedness and numbness.    Allergies  Review of patient's allergies indicates no known allergies.  Home Medications   Current Outpatient Rx  Name Route Sig Dispense Refill  .  ALBUTEROL SULFATE HFA 108 (90 BASE) MCG/ACT IN AERS Inhalation Inhale 2 puffs into the lungs every 4 (four) hours as needed. allergies    . IBUPROFEN 600 MG PO TABS Oral Take 1 tablet (600 mg total) by mouth every 6 (six) hours as needed for pain. 30 tablet 0  . OXYCODONE-ACETAMINOPHEN 5-325 MG PO TABS Oral Take 1 tablet by mouth every 4 (four) hours as needed for pain. 30 tablet 0    BP 105/65  Pulse 70  Temp 98.4 F (36.9 C) (Oral)  Resp 16  Ht 5\' 7"  (1.702 m)  Wt 180 lb (81.647 kg)  BMI 28.19 kg/m2  SpO2 97%  LMP 06/15/2012  Physical Exam  Nursing note and vitals reviewed. Constitutional: She is oriented to person, place, and time. She appears well-developed and well-nourished.       Uncomfortable appearing  HENT:  Head: Normocephalic.  Mouth/Throat: Oropharynx is clear and moist.       Ecchymosis over patients left zygomatic arch.    Eyes: Conjunctivae normal and EOM are normal. Pupils are equal, round, and reactive to light. Right conjunctiva is not injected. Left conjunctiva is not injected.  Neck: Normal range of motion. Neck supple. Spinous process tenderness and muscular tenderness present.  Cardiovascular: Normal rate, normal heart sounds and intact distal pulses.  Exam reveals no decreased pulses.   Pulses:      Dorsalis pedis pulses are 2+  on the right side, and 2+ on the left side.  Pulmonary/Chest: Effort normal.  Abdominal: Soft. There is no tenderness.  Musculoskeletal: Normal range of motion. She exhibits edema and tenderness.       Right ankle: Achilles tendon normal.       Right foot: She exhibits tenderness and swelling. She exhibits normal capillary refill, no crepitus and no deformity.       Feet:  Lymphadenopathy:    She has no cervical adenopathy.  Neurological: She is alert and oriented to person, place, and time. She has normal strength. No sensory deficit. Gait normal. GCS eye subscore is 4. GCS verbal subscore is 5. GCS motor subscore is 6.        Normal heel-shin, normal rapid alternating movements. Cranial nerves III-XII intact.  No pronator drift.  Skin: Skin is warm, dry and intact. No rash noted.  Psychiatric: She has a normal mood and affect. Her speech is normal and behavior is normal. Thought content normal. Cognition and memory are normal.    ED Course  Procedures (including critical care time)  Labs Reviewed - No data to display No results found.   1. Contusion of right foot   2. Facial contusion   3. Cervical strain     Pt placed in Phili collar pending Ct scans.  Given toradol 60 mg IM, oxycodone 2 tabs.  Once returned from xray and Ct uncomfortable again,  Given dilaudid 1 mg IM with improvement in pain.    MDM  Ct's and plain films reviewed and discussed with patient.  Jones dressing and post op shoe applied to right foot,  Crutches supplied.  Encouraged rest,  Minimize weight bearing,  Ice, elevation.  Recheck by Dr. Hilda Lias if sx are not improving over the next 10 days.  No neuro deficit on exam or by history to suggest emergent or surgical presentation.  Discussed return here for any worsened sx.  Pt understands plan.            Burgess Amor, PA 07/09/12 2257  Burgess Amor, PA 07/09/12 2258

## 2012-07-10 NOTE — ED Provider Notes (Signed)
Medical screening examination/treatment/procedure(s) were performed by non-physician practitioner and as supervising physician I was immediately available for consultation/collaboration.   Benny Lennert, MD 07/10/12 1453

## 2013-08-20 ENCOUNTER — Inpatient Hospital Stay (HOSPITAL_COMMUNITY)
Admission: EM | Admit: 2013-08-20 | Discharge: 2013-08-21 | DRG: 070 | Discharge status: Against Medical Advice | Payer: Self-pay | Attending: Internal Medicine | Admitting: Internal Medicine

## 2013-08-20 ENCOUNTER — Emergency Department (HOSPITAL_COMMUNITY): Payer: Self-pay

## 2013-08-20 ENCOUNTER — Encounter (HOSPITAL_COMMUNITY): Payer: Self-pay | Admitting: Internal Medicine

## 2013-08-20 DIAGNOSIS — R5381 Other malaise: Secondary | ICD-10-CM | POA: Diagnosis present

## 2013-08-20 DIAGNOSIS — R0789 Other chest pain: Secondary | ICD-10-CM | POA: Diagnosis present

## 2013-08-20 DIAGNOSIS — F19929 Other psychoactive substance use, unspecified with intoxication, unspecified: Secondary | ICD-10-CM

## 2013-08-20 DIAGNOSIS — T424X1A Poisoning by benzodiazepines, accidental (unintentional), initial encounter: Secondary | ICD-10-CM

## 2013-08-20 DIAGNOSIS — F17200 Nicotine dependence, unspecified, uncomplicated: Secondary | ICD-10-CM | POA: Diagnosis present

## 2013-08-20 DIAGNOSIS — R0602 Shortness of breath: Secondary | ICD-10-CM | POA: Diagnosis present

## 2013-08-20 DIAGNOSIS — T43591A Poisoning by other antipsychotics and neuroleptics, accidental (unintentional), initial encounter: Secondary | ICD-10-CM | POA: Diagnosis present

## 2013-08-20 DIAGNOSIS — Z801 Family history of malignant neoplasm of trachea, bronchus and lung: Secondary | ICD-10-CM

## 2013-08-20 DIAGNOSIS — D72829 Elevated white blood cell count, unspecified: Secondary | ICD-10-CM | POA: Diagnosis present

## 2013-08-20 DIAGNOSIS — J9600 Acute respiratory failure, unspecified whether with hypoxia or hypercapnia: Secondary | ICD-10-CM | POA: Diagnosis present

## 2013-08-20 DIAGNOSIS — T424X4A Poisoning by benzodiazepines, undetermined, initial encounter: Secondary | ICD-10-CM | POA: Diagnosis present

## 2013-08-20 DIAGNOSIS — G9340 Encephalopathy, unspecified: Principal | ICD-10-CM | POA: Diagnosis present

## 2013-08-20 DIAGNOSIS — R059 Cough, unspecified: Secondary | ICD-10-CM | POA: Diagnosis present

## 2013-08-20 DIAGNOSIS — R05 Cough: Secondary | ICD-10-CM | POA: Diagnosis present

## 2013-08-20 DIAGNOSIS — J45909 Unspecified asthma, uncomplicated: Secondary | ICD-10-CM | POA: Diagnosis present

## 2013-08-20 DIAGNOSIS — R4789 Other speech disturbances: Secondary | ICD-10-CM | POA: Diagnosis present

## 2013-08-20 DIAGNOSIS — J45901 Unspecified asthma with (acute) exacerbation: Secondary | ICD-10-CM | POA: Diagnosis present

## 2013-08-20 DIAGNOSIS — R000 Tachycardia, unspecified: Secondary | ICD-10-CM | POA: Diagnosis present

## 2013-08-20 DIAGNOSIS — F1210 Cannabis abuse, uncomplicated: Secondary | ICD-10-CM | POA: Diagnosis present

## 2013-08-20 LAB — COMPREHENSIVE METABOLIC PANEL
ALT: 13 U/L (ref 0–35)
AST: 24 U/L (ref 0–37)
Albumin: 3.7 g/dL (ref 3.5–5.2)
Alkaline Phosphatase: 70 U/L (ref 39–117)
Calcium: 8.9 mg/dL (ref 8.4–10.5)
Chloride: 98 mEq/L (ref 96–112)
GFR calc Af Amer: 88 mL/min — ABNORMAL LOW (ref >90)
Glucose, Bld: 123 mg/dL — ABNORMAL HIGH (ref 70–99)
Potassium: 4.3 mEq/L (ref 3.5–5.1)
Sodium: 134 mEq/L — ABNORMAL LOW (ref 135–145)
Total Protein: 7.2 g/dL (ref 6.0–8.3)

## 2013-08-20 LAB — CBC WITH DIFFERENTIAL/PLATELET
Basophils Absolute: 0 10*3/uL (ref 0.0–0.1)
Basophils Relative: 0 % (ref 0–1)
Eosinophils Absolute: 0.2 10*3/uL (ref 0.0–0.7)
Eosinophils Relative: 1 % (ref 0–5)
Hemoglobin: 12 g/dL (ref 12.0–15.0)
Lymphs Abs: 0.8 10*3/uL (ref 0.7–4.0)
MCH: 27.7 pg (ref 26.0–34.0)
Monocytes Relative: 5 % (ref 3–12)
Neutro Abs: 15.9 10*3/uL — ABNORMAL HIGH (ref 1.7–7.7)
Neutrophils Relative %: 90 % — ABNORMAL HIGH (ref 43–77)
Platelets: 471 10*3/uL — ABNORMAL HIGH (ref 150–400)
RBC: 4.33 MIL/uL (ref 3.87–5.11)
RDW: 13.5 % (ref 11.5–15.5)

## 2013-08-20 LAB — SALICYLATE LEVEL: Salicylate Lvl: <2 mg/dL — ABNORMAL LOW (ref 2.8–20.0)

## 2013-08-20 LAB — ACETAMINOPHEN LEVEL: Acetaminophen (Tylenol), Serum: <15 ug/mL (ref 10–30)

## 2013-08-20 MED ORDER — ACETAMINOPHEN 650 MG RE SUPP: 650.0000 mg | Freq: Four times a day (QID) | RECTAL | Status: DC | PRN

## 2013-08-20 MED ORDER — ACETAMINOPHEN 325 MG PO TABS
650.0000 mg | ORAL_TABLET | Freq: Four times a day (QID) | ORAL | Status: DC | PRN
  Administered 2013-08-21 (×2): 650 mg via ORAL
  Filled 2013-08-20 (×2): qty 2

## 2013-08-20 MED ORDER — NALOXONE HCL 0.4 MG/ML IJ SOLN
0.4000 mg | Freq: Once | INTRAMUSCULAR | Status: AC
  Administered 2013-08-20: 0.4 mg via INTRAVENOUS
  Filled 2013-08-20: qty 1

## 2013-08-20 MED ORDER — SODIUM CHLORIDE 0.9 % IV SOLN
3.0000 g | INTRAVENOUS | Status: AC
  Administered 2013-08-20: 3 g via INTRAVENOUS
  Filled 2013-08-20: qty 3

## 2013-08-20 MED ORDER — SODIUM CHLORIDE 0.9 % IV SOLN
INTRAVENOUS | Status: DC
  Administered 2013-08-20: 12:00:00 via INTRAVENOUS

## 2013-08-20 MED ORDER — ALBUTEROL SULFATE (5 MG/ML) 0.5% IN NEBU: 2.5000 mg | INHALATION_SOLUTION | RESPIRATORY_TRACT | Status: DC | PRN

## 2013-08-20 MED ORDER — ACETAMINOPHEN 325 MG PO TABS
650.0000 mg | ORAL_TABLET | Freq: Once | ORAL | Status: AC
  Administered 2013-08-20: 650 mg via ORAL
  Filled 2013-08-20: qty 2

## 2013-08-20 MED ORDER — HEPARIN SODIUM (PORCINE) 5000 UNIT/ML IJ SOLN
5000.0000 [IU] | Freq: Three times a day (TID) | INTRAMUSCULAR | Status: DC
  Administered 2013-08-20 – 2013-08-21 (×2): 5000 [IU] via SUBCUTANEOUS
  Filled 2013-08-20 (×5): qty 1

## 2013-08-20 MED ORDER — SODIUM CHLORIDE 0.9 % IV SOLN
INTRAVENOUS | Status: DC
  Administered 2013-08-20 – 2013-08-21 (×2): 1000 mL via INTRAVENOUS

## 2013-08-20 MED ORDER — SODIUM CHLORIDE 0.9 % IV BOLUS (SEPSIS)
1000.0000 mL | Freq: Once | INTRAVENOUS | Status: AC
  Administered 2013-08-20: 1000 mL via INTRAVENOUS

## 2013-08-20 MED ORDER — ALBUTEROL SULFATE (5 MG/ML) 0.5% IN NEBU
2.5000 mg | INHALATION_SOLUTION | Freq: Three times a day (TID) | RESPIRATORY_TRACT | Status: DC
  Administered 2013-08-21 (×2): 2.5 mg via RESPIRATORY_TRACT
  Filled 2013-08-20 (×2): qty 0.5

## 2013-08-20 MED ORDER — POLYETHYLENE GLYCOL 3350 17 G PO PACK
17.0000 g | PACK | Freq: Every day | ORAL | Status: DC | PRN
  Filled 2013-08-20: qty 1

## 2013-08-20 MED ORDER — METHYLPREDNISOLONE SODIUM SUCC 125 MG IJ SOLR
60.0000 mg | Freq: Two times a day (BID) | INTRAMUSCULAR | Status: DC
  Administered 2013-08-21: 60 mg via INTRAVENOUS
  Filled 2013-08-20 (×3): qty 0.96

## 2013-08-20 MED ORDER — ALBUTEROL SULFATE (5 MG/ML) 0.5% IN NEBU
2.5000 mg | INHALATION_SOLUTION | Freq: Four times a day (QID) | RESPIRATORY_TRACT | Status: DC
  Administered 2013-08-20: 2.5 mg via RESPIRATORY_TRACT
  Filled 2013-08-20: qty 0.5

## 2013-08-20 MED ORDER — ONDANSETRON HCL 4 MG PO TABS: 4.0000 mg | ORAL_TABLET | Freq: Four times a day (QID) | ORAL | Status: DC | PRN

## 2013-08-20 MED ORDER — SODIUM CHLORIDE 0.9 % IV SOLN
3.0000 g | Freq: Four times a day (QID) | INTRAVENOUS | Status: DC
  Administered 2013-08-21 (×2): 3 g via INTRAVENOUS
  Filled 2013-08-20 (×4): qty 3

## 2013-08-20 MED ORDER — ALBUTEROL SULFATE (5 MG/ML) 0.5% IN NEBU
5.0000 mg | INHALATION_SOLUTION | Freq: Once | RESPIRATORY_TRACT | Status: AC
  Administered 2013-08-20: 5 mg via RESPIRATORY_TRACT
  Filled 2013-08-20: qty 1

## 2013-08-20 MED ORDER — METHYLPREDNISOLONE SODIUM SUCC 125 MG IJ SOLR
125.0000 mg | Freq: Four times a day (QID) | INTRAMUSCULAR | Status: DC
  Administered 2013-08-20: 125 mg via INTRAVENOUS
  Filled 2013-08-20 (×3): qty 2

## 2013-08-20 MED ORDER — ONDANSETRON HCL 4 MG/2ML IJ SOLN
4.0000 mg | Freq: Four times a day (QID) | INTRAMUSCULAR | Status: DC | PRN
  Administered 2013-08-21: 4 mg via INTRAVENOUS
  Filled 2013-08-20: qty 2

## 2013-08-20 NOTE — ED Notes (Signed)
Patient reports taking an unknown amount of Neurontin today at an unknown time in order to treat her pain, as well as Ativan. Patient nods head when asked if it was a "handful" of pills, denies taking only one or two pills. Denies SI. Patient is lethargic but arousable.

## 2013-08-20 NOTE — ED Notes (Signed)
Patient transported to X-ray 

## 2013-08-20 NOTE — ED Notes (Addendum)
Pt with sleeping soundly. Pt awoken and reminded to eat.

## 2013-08-20 NOTE — ED Notes (Signed)
Bed: WA20 Expected date: 08/20/13 Expected time: 10:55 AM Means of arrival: Ambulance Comments: [facility]

## 2013-08-20 NOTE — ED Notes (Signed)
Attempted to stand pt up and walk with the help of RN Earlene Plater, however pt intently threw herself back onto stretcher complaining of chronic pain. MD Freida Busman made aware. Pt will be assisted into wheelchair at the time of discharge.

## 2013-08-20 NOTE — H&P (Signed)
[facility] History and Physical  Melanie Mendoza MWU:[id number] DOB: 28-May-1971 DOA: 08/20/2013  Referring physician: Dr. Lawrence Marseilles PCP: No primary provider on file.  Specialists: none  Chief Complaint: confusion  HPI: Melanie Mendoza is a 42 y.o. female  With past medical history of asthma, the history is limited as the patient is lethargic, she was recently released from [place] and was found to have a decreased level of consciousness. She relates to me that she took about 10 Xanax and chew fentanyl patch Patient was noted to have a slurred speech pinpoint pupils and lethargic, she relates she did not try to commit suicide she just did not want to get caught with these medications on her, her CBGs was checked and her blood glucose was 100.  In the ED: The ED physician she was given Narcan with some improvement in her mentation, she was found to be setting 80% on room air, CBC was done with a white count of 17 in the differential 15.9 chest x-ray was done that showed no infiltrates, UDS is pending salicylate levels less than 5 Tylenol level is less than 15 alcohol level is less than 1112-lead EKG is unremarkable as we were consulted for further evaluation.  Review of Systems: The patient denies anorexia, fever, weight loss,, vision loss, decreased hearing, hoarseness, chest pain, syncope, dyspnea on exertion, peripheral edema, balance deficits, hemoptysis, abdominal pain, melena, hematochezia, severe indigestion/heartburn, hematuria, incontinence, genital sores, muscle weakness, suspicious skin lesions, transient blindness, difficulty walking, depression, unusual weight change, abnormal bleeding, enlarged lymph nodes, angioedema, and breast masses.    Past Medical History  Diagnosis Date  . Asthma    History reviewed. No pertinent past surgical history. Social History:  reports that she has been smoking Cigarettes.  She has been smoking about 21.00 packs per day. She does not have any  smokeless tobacco history on file. She reports that she drinks about 2.4 ounces of alcohol per week. She reports that she uses illicit drugs (Marijuana). [facility] she says she is going to stay with family members when she's discharged from the hospital?  No Known Allergies  Family History  Problem Relation Age of Onset  . Lung cancer Mother     Prior to Admission medications   Medication Sig Start Date End Date Taking? Authorizing Provider  albuterol (PROVENTIL HFA;VENTOLIN HFA) 108 (90 BASE) MCG/ACT inhaler Inhale 2 puffs into the lungs every 4 (four) hours as needed for shortness of breath.    Yes Historical Provider, MD   Physical Exam: Filed Vitals:   08/20/13 1630  BP:   Pulse: 102  Temp:   Resp: 16    BP 114/74  Pulse 102  Temp(Src) 98.2 F (36.8 C) (Oral)  Resp 16  SpO2 94%  General Appearance:    Alert, cooperative, no distress, appears stated age              Throat:   Lips, mucosa, and tongue dry.  Neck:   Supple, symmetrical, trachea midline, no adenopathy;    thyroid:  no JVD     Lungs:     Moderate air movement, wheezing B/L, with crackles on the right.  Chest Wall:    No tenderness or deformity   Heart:    Regular rate and rhythm, S1 and S2 normal, no murmur, rub   or gallop     Abdomen:     Soft, non-tender, bowel sounds active all four quadrants,    no masses, no  organomegaly        Extremities:   Extremities normal, atraumatic, no cyanosis or edema  Pulses:   2+ and symmetric all extremities     Lymph nodes:   Cervical, supraclavicular, and axillary nodes normal  Neurologic:   CNII-XII intact, normal strength, sensation and reflexes    throughout     Labs on Admission:  Basic Metabolic Panel:  Recent Labs Lab 08/20/13 1200  NA 134*  K 4.3  CL 98  CO2 29  GLUCOSE 123*  BUN 15  CREATININE 0.91  CALCIUM 8.9   Liver Function Tests:  Recent Labs Lab 08/20/13 1200  AST 24  ALT 13  ALKPHOS 70  BILITOT 0.3   PROT 7.2  ALBUMIN 3.7   No results found for this basename: LIPASE, AMYLASE,  in the last 168 hours No results found for this basename: AMMONIA,  in the last 168 hours CBC:  Recent Labs Lab 08/20/13 1200  WBC 17.7*  NEUTROABS 15.9*  HGB 12.0  HCT 38.2  MCV 88.2  PLT 471*   Cardiac Enzymes: No results found for this basename: CKTOTAL, CKMB, CKMBINDEX, TROPONINI,  in the last 168 hours  BNP (last 3 results) No results found for this basename: PROBNP,  in the last 8760 hours CBG: No results found for this basename: GLUCAP,  in the last 168 hours  Radiological Exams on Admission: Dg Chest 2 View  08/20/2013   CLINICAL DATA:  Cough. Shortness of breath. Mid chest pain.  EXAM: CHEST  2 VIEW  COMPARISON:  12/16/2008  FINDINGS: The heart size and mediastinal contours are within normal limits. Both lungs are clear. 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 Bony thorax is otherwise intact.  IMPRESSION: No active cardiopulmonary disease.   Electronically Signed   By: Amie Portland M.D.   On: 08/20/2013 12:39    EKG: Independently reviewed. Sinus tach possible left atrial enlargement  Assessment/Plan Acute encephalopathy/ Overdose of benzodiazepine: - This most likely medication induced, she does admit to drinking multiple Xanax in chewing on a fentanyl patch. She does have mildly pinpoint pupils. I will go ahead and admit her to MedSurg, and continue to monitor her sats.  - She relates this was unintentional, she just did not want to get caught with the Xanax and fentanyl as she was just released from jail.  - Unlikely infectious or hypoxic etiology contributing so rapidly to her confusion, please see below. As she continues to be sleepy satting greater than 95% on 2 L of oxygen.    Acute respiratory failure/ Asthma exacerbation/ Leukocytosis - Interesting presentation her asthma as she continues to persistently cough. She does have on my examination crackles on the right  lower lung not clear with cough. My concern is that due to to her overdose of benzodiazepines she lost consciousness and aspirated, which trigger her acute asthma exacerbation.  - I will go ahead and start her on IV Unasyn, Solu-Medrol, will place her on a continuous pulse ox, will use albuterol schedule every 4 hours and every 2 hours when necessary.    Code Status: full Family Communication: none  Disposition Plan: inpatient  Time spent: 90 minutes  Marinda Elk [facility] Pager [phone]  If 7PM-7AM, please contact night-coverage [email or web address] Password [email or web address] 08/20/2013, 5:56 PM

## 2013-08-20 NOTE — ED Notes (Signed)
Per EMS, patient was being released from jail after a 24 hour stay when she c/o SOB, pinpoint pupils, lethargy, patient denies drug use. Patient denies medications, allergies, or medical history.

## 2013-08-20 NOTE — ED Notes (Signed)
Upon improvement of mental status secondary to Narcan, Pt reports taking 10 Xanax 1mg  tablets. Denies any attempt to harm self. Unaware of time of ingestion.

## 2013-08-20 NOTE — ED Notes (Addendum)
Patient reports that she consumed 1 Fentanyl patch in addition to the 10 Xanax 1 mg tabs. Patient denies taking Ativan or Neurontin, which she formerly claimed to have consumed while lethargic, prior to Narcan intervention.

## 2013-08-20 NOTE — ED Provider Notes (Addendum)
5:20 PM Called to patient's bedside due to wheezing and hypoxia. Patient noted to be in mid 80s on room air. Will give albuterol neb and place on O2. Was supposed to be discharged but patient is unable to stand because of her sleepiness, likely from opiod overdose. Will hold on narcan as she is awake and arousable and protecting airway. However, she is in no condition to be discharged. Given that she chewed on "some" of a fentanyl patch, she will need to be admitted.  5:45 PM Hospitalist will admit. Requests medsurg bed, will see in ED.    Audree Camel, MD 08/20/13 1745

## 2013-08-20 NOTE — ED Provider Notes (Signed)
CSN: [id number]     Arrival date & time 08/20/13  1103 History   First MD Initiated Contact with Patient 08/20/13 1108     Chief Complaint  Patient presents with  . Altered Mental Status   (Consider location/radiation/quality/duration/timing/severity/associated sxs/prior Treatment) Patient is a 42 y.o. female presenting with altered mental status. The history is provided by the patient. The history is limited by the condition of the patient.  Altered Mental Status  [facility] and was found to have a decreased level of consciousness. She had been in jail for approximately 24 hours on a [facility] discharge. Patient is noted to have slurred speech, pinpoint pupils, lethargy. She is not forthcoming with history of this time. EMS was called and patient CBG was about 100. No Narcan was given. Possible use of Ativan as well as Neurontin and she cannot tell me if this was a suicide attempt. Patient was transported here  Past Medical History  Diagnosis Date  . Asthma    No past surgical history on file. No family history on file. History  Substance Use Topics  . Smoking status: Never Smoker   . Smokeless tobacco: Not on file  . Alcohol Use: 2.4 oz/week    2 Cans of beer, 2 Shots of liquor per week   OB History   Grav Para Term Preterm Abortions TAB SAB Ect Mult Living            2     Review of Systems  Unable to perform ROS   Allergies  Review of patient's allergies indicates no known allergies.  Home Medications   Current Outpatient Rx  Name  Route  Sig  Dispense  Refill  . albuterol (PROVENTIL HFA;VENTOLIN HFA) 108 (90 BASE) MCG/ACT inhaler   Inhalation   Inhale 2 puffs into the lungs every 4 (four) hours as needed. allergies         . ibuprofen (ADVIL,MOTRIN) 600 MG tablet   Oral   Take 1 tablet (600 mg total) by mouth every 6 (six) hours as needed for pain.   30 tablet   0   . oxyCODONE-acetaminophen  (PERCOCET/ROXICET) 5-325 MG per tablet   Oral   Take 1 tablet by mouth every 4 (four) hours as needed for pain.   30 tablet   0    BP 120/90  Pulse 108  Temp(Src) 98.2 F (36.8 C) (Oral)  SpO2 94% Physical Exam  Nursing note and vitals reviewed. Constitutional: She is oriented to person, place, and time. She appears well-developed and well-nourished. She appears lethargic.  Non-toxic appearance. No distress.  HENT:  Head: Normocephalic and atraumatic.  Eyes: Conjunctivae, EOM and lids are normal. Pupils are equal, round, and reactive to light.  Neck: Normal range of motion. Neck supple. No tracheal deviation present. No mass present.  Cardiovascular: Regular rhythm and normal heart sounds.  Tachycardia present.  Exam reveals no gallop.   No murmur heard. Pulmonary/Chest: Effort normal. No stridor. No respiratory distress. She has decreased breath sounds. She has wheezes. She has no rhonchi. She has no rales.  Abdominal: Soft. Normal appearance and bowel sounds are normal. She exhibits no distension. There is no tenderness. There is no rebound and no CVA tenderness.  Musculoskeletal: Normal range of motion. She exhibits no edema and no tenderness.  Neurological: She is oriented to person, place, and time. She appears lethargic. No cranial nerve deficit or sensory deficit. GCS eye subscore is  4. GCS verbal subscore is 5. GCS motor subscore is 6.  Skin: Skin is warm and dry. No abrasion and no rash noted.  Psychiatric: Her affect is blunt. Her speech is delayed. She is slowed.    ED Course  Procedures (including critical care time) Labs Review Labs Reviewed  ETHANOL  URINE RAPID DRUG SCREEN (HOSP PERFORMED)  SALICYLATE LEVEL  ACETAMINOPHEN LEVEL  CBC WITH DIFFERENTIAL  COMPREHENSIVE METABOLIC PANEL   Imaging Review No results found.  EKG Interpretation     Ventricular Rate:  110 PR Interval:  88 QRS Duration: 90 QT Interval:  329 QTC Calculation: 445 R Axis:   62 Text  Interpretation:  Sinus tachycardia            MDM  No diagnosis found. Patient arrived here and was somnolent but able to protect her airway. She was aroused with stimulation. Was given Narcan 0.4 mg and became much more soft. She admits that she chewed fentayl patches as well as took Xanax. She has a mild wheezing and was given albuterol treatment for this chest x-ray was negative for infiltrate. Reassessed multiple times and she is now sitting up in bed eating a sandwich. She has a mild tachycardia likely from her albuterol. She'll be discharged to a friend.   Toy Baker, MD 08/20/13 1430

## 2013-08-21 ENCOUNTER — Inpatient Hospital Stay (HOSPITAL_COMMUNITY): Payer: Self-pay

## 2013-08-21 DIAGNOSIS — F19988 Other psychoactive substance use, unspecified with other psychoactive substance-induced disorder: Secondary | ICD-10-CM

## 2013-08-21 LAB — BLOOD GAS, ARTERIAL
Bicarbonate: 25.5 mEq/L — ABNORMAL HIGH (ref 20.0–24.0)
Drawn by: 276051
FIO2: 0.21 %
O2 Saturation: 91 %
Patient temperature: 98.6
TCO2: 24 mmol/L (ref 0–100)
pH, Arterial: 7.374 (ref 7.350–7.450)
pO2, Arterial: 60.5 mmHg — ABNORMAL LOW (ref 80.0–100.0)

## 2013-08-21 LAB — CBC
Hemoglobin: 9.7 g/dL — ABNORMAL LOW (ref 12.0–15.0)
MCH: 27.8 pg (ref 26.0–34.0)
MCHC: 31.6 g/dL (ref 30.0–36.0)
MCV: 88 fL (ref 78.0–100.0)
RBC: 3.49 MIL/uL — ABNORMAL LOW (ref 3.87–5.11)
WBC: 19 10*3/uL — ABNORMAL HIGH (ref 4.0–10.5)

## 2013-08-21 LAB — URINALYSIS, ROUTINE W REFLEX MICROSCOPIC
Bilirubin Urine: NEGATIVE
Glucose, UA: >1000 mg/dL — AB
Ketones, ur: 15 mg/dL — AB
Leukocytes, UA: NEGATIVE
Nitrite: NEGATIVE
Specific Gravity, Urine: 1.035 — ABNORMAL HIGH (ref 1.005–1.030)
pH: 6 (ref 5.0–8.0)

## 2013-08-21 LAB — COMPREHENSIVE METABOLIC PANEL
AST: 22 U/L (ref 0–37)
Albumin: 2.9 g/dL — ABNORMAL LOW (ref 3.5–5.2)
BUN: 10 mg/dL (ref 6–23)
Calcium: 8.4 mg/dL (ref 8.4–10.5)
Creatinine, Ser: 0.54 mg/dL (ref 0.50–1.10)
GFR calc non Af Amer: >90 mL/min (ref >90)
Glucose, Bld: 185 mg/dL — ABNORMAL HIGH (ref 70–99)
Potassium: 3.7 mEq/L (ref 3.5–5.1)
Total Protein: 6.3 g/dL (ref 6.0–8.3)

## 2013-08-21 LAB — RAPID URINE DRUG SCREEN, HOSP PERFORMED
Amphetamines: POSITIVE — AB
Tetrahydrocannabinol: NOT DETECTED

## 2013-08-21 LAB — URINE MICROSCOPIC-ADD ON

## 2013-08-21 MED ORDER — ALBUTEROL SULFATE (5 MG/ML) 0.5% IN NEBU: 2.5000 mg | INHALATION_SOLUTION | RESPIRATORY_TRACT | Status: DC | PRN

## 2013-08-21 MED ORDER — PNEUMOCOCCAL VAC POLYVALENT 25 MCG/0.5ML IJ INJ: 0.5000 mL | INJECTION | INTRAMUSCULAR | Status: DC

## 2013-08-21 MED ORDER — ALBUTEROL SULFATE (5 MG/ML) 0.5% IN NEBU: 2.5000 mg | INHALATION_SOLUTION | Freq: Four times a day (QID) | RESPIRATORY_TRACT | Status: DC

## 2013-08-21 MED ORDER — INFLUENZA VAC SPLIT QUAD 0.5 ML IM SUSP: 0.5000 mL | INTRAMUSCULAR | Status: DC

## 2013-08-21 MED ORDER — METHYLPREDNISOLONE SODIUM SUCC 125 MG IJ SOLR
60.0000 mg | Freq: Three times a day (TID) | INTRAMUSCULAR | Status: DC
  Filled 2013-08-21 (×2): qty 0.96

## 2013-08-21 NOTE — Progress Notes (Signed)
Patient informed this RN of her need to leave hospital, citing a family emergency. Dr. Janee Morn notified. Patient discharged AMA as per protocol. Educated to seek medical care if respiratory issues develop such as difficulty breathing. Patient expressed understanding.

## 2013-08-21 NOTE — Progress Notes (Signed)
[facility] PROGRESS NOTE  Melanie Mendoza:[id number] DOB: December 18, 1970 DOA: 08/20/2013 PCP: No primary provider on file.  Assessment/Plan: #1 acute encephalopathy/drug overdose Secondary to benzodiazepine overdose. Patient is status post Narcan in the ED. Patient is alert and following commands and asking questions appropriately. Patient denies any suicidal or homicidal ideation. Continue supportive care. IV fluids. Follow.  #2 acute asthma exacerbation Patient with complaints of shortness of breath on ambulation with some wheezing and cough. Chest x-ray on admission was negative. Check ABG. Will repeat chest x-ray today. Continue IV steroids, scheduled nebulizers, IV antibiotics.  #3 leukocytosis Questionable etiology. Will repeat a chest x-ray. Check a UA with cultures and sensitivities. Check blood cultures x2. Continue empiric IV Unasyn.  #4 prophylaxis Heparin for DVT prophylaxis.  Code Status: Full. Family Communication: Updated patient no family at bedside. Disposition Plan: Home when medically stable.   Consultants:  None  Procedures:  Chest x-ray 08/20/2013    Antibiotics:  IV Unasyn 08/20/2013  HPI/Subjective: Patient states shortness of breath when going to the bathroom. Patient with some wheezing. Patient with cough. Patient states respiratory symptoms have been ongoing for 2 weeks and surround of her inhaler. Patient denies any suicidal ideation.  Objective: Filed Vitals:   08/21/13 0650  BP:   Pulse: 87  Temp:   Resp: 14    Intake/Output Summary (Last 24 hours) at 08/21/13 1357 Last data filed at 08/21/13 0648  Gross per 24 hour  Intake 2319.75 ml  Output    200 ml  Net 2119.75 ml   Filed Weights   08/20/13 1900  Weight: 83.9 kg (184 lb 15.5 oz)    Exam:   General:  NAD  Cardiovascular: RRR  Respiratory: tight, inspiratory and expiratory wheezing,No rhonchi.   Abdomen: Soft, nontender, nondistended, positive bowel sounds.    Musculoskeletal: No clubbing cyanosis or edema.  Data Reviewed: Basic Metabolic Panel:  Recent Labs Lab 08/20/13 1200 08/21/13 0525  NA 134* 136  K 4.3 3.7  CL 98 103  CO2 29 26  GLUCOSE 123* 185*  BUN 15 10  CREATININE 0.91 0.54  CALCIUM 8.9 8.4   Liver Function Tests:  Recent Labs Lab 08/20/13 1200 08/21/13 0525  AST 24 22  ALT 13 11  ALKPHOS 70 58  BILITOT 0.3 0.3  PROT 7.2 6.3  ALBUMIN 3.7 2.9*   No results found for this basename: LIPASE, AMYLASE,  in the last 168 hours No results found for this basename: AMMONIA,  in the last 168 hours CBC:  Recent Labs Lab 08/20/13 1200 08/21/13 0525  WBC 17.7* 19.0*  NEUTROABS 15.9*  --   HGB 12.0 9.7*  HCT 38.2 30.7*  MCV 88.2 88.0  PLT 471* 349   Cardiac Enzymes: No results found for this basename: CKTOTAL, CKMB, CKMBINDEX, TROPONINI,  in the last 168 hours BNP (last 3 results) No results found for this basename: PROBNP,  in the last 8760 hours CBG: No results found for this basename: GLUCAP,  in the last 168 hours  No results found for this or any previous visit (from the past 240 hour(s)).   Studies: Dg Chest 2 View  08/20/2013   CLINICAL DATA:  Cough. Shortness of breath. Mid chest pain.  EXAM: CHEST  2 VIEW  COMPARISON:  12/16/2008  FINDINGS: The heart size and mediastinal contours are within normal limits. Both lungs are clear. 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 Bony thorax is otherwise intact.  IMPRESSION: No active cardiopulmonary disease.   Electronically  Signed   By: Amie Portland M.D.   On: 08/20/2013 12:39    Scheduled Meds: . albuterol  2.5 mg Nebulization TID  . ampicillin-sulbactam (UNASYN) IV  3 g Intravenous Q6H  . heparin  5,000 Units Subcutaneous Q8H  . [START ON 08/22/2013] influenza vac split quadrivalent PF  0.5 mL Intramuscular Tomorrow-1000  . methylPREDNISolone (SOLU-MEDROL) injection  60 mg Intravenous BID  . [START ON 08/22/2013] pneumococcal 23 valent vaccine   0.5 mL Intramuscular Tomorrow-1000   Continuous Infusions: . sodium chloride Stopped (08/20/13 1657)  . sodium chloride 1,000 mL (08/21/13 4098)    Principal Problem:   Acute encephalopathy Active Problems:   Overdose of benzodiazepine   Acute respiratory failure   Asthma exacerbation   Leukocytosis    Time spent: 30 mins    [facility]  [facility] Pager [phone]. If 7PM-7AM, please contact night-coverage at [email or web address], password [facility] 08/21/2013, 1:57 PM  LOS: 1 day

## 2013-08-21 NOTE — Discharge Summary (Signed)
Physician Discharge Summary  [name]:[id number] DOB: 10-Oct-1971 DOA: 08/20/2013  PCP: No primary provider on file.  Admit date: 08/20/2013 Discharge date: 08/21/2013  Time spent: 60 minutes  Recommendations for Outpatient Follow-up:  1. Patient left AGAINST MEDICAL ADVICE hopefully she will followup with PCP as outpatient.  Discharge Diagnoses:  Principal Problem:   Acute encephalopathy Active Problems:   Overdose of benzodiazepine   Acute respiratory failure   Asthma exacerbation   Leukocytosis   Discharge Condition: Patient left AGAINST MEDICAL ADVICE.  Diet recommendation: Regular  Filed Weights   08/20/13 1900  Weight: 83.9 kg (184 lb 15.5 oz)    History of present illness:  Melanie Mendoza is a 42 y.o. female  With past medical history of asthma, the history is limited as the patient is lethargic, she was recently released from [place] and was found to have a decreased level of consciousness. She relates to me that she took about 10 Xanax and chew fentanyl patch Patient was noted to have a slurred speech pinpoint pupils and lethargic, she relates she did not try to commit suicide she just did not want to get caught with these medications on her, her CBGs was checked and her blood glucose was 100.   Hospital Course:    Patient left AGAINST MEDICAL ADVICE  #1 acute encephalopathy/drug overdose Patient was admitted with altered mental status secondary to drug overdose from benzodiazepine. It was noted per patient that she was recently released from [place] and she did not want to get caught with these medications prior polys and a such she ingested them. Patient denied any suicidal or homicidal ideation. Patient currently stable awake alert and back to her baseline. Hopefully patient will followup with PCP as outpatient.  #2 acute asthma exacerbation Patient with complaints of shortness of breath on ambulation with wheezing and cough have been ongoing for  approximately a couple of weeks. Chest x-ray done on admission was negative. Patient still with some chest tightness and wheezing. Patient was placed on IV Solu-Medrol, IV Unasyn and scheduled nebulizer treatments.  Patient left AGAINST MEDICAL ADVICE.  #3 leukocytosis  on admission patient was noted to have a leukocytosis, with a white count of 17,000 that went up to 19,000 on the day of discharge. Chest x-ray which was done was negative. Urinalysis which was done was negative. Patient however symptoms have a cough that was on ongoing x2 weeks and was complaining of some shortness of breath. We were going to order blood cultures x2 and repeat a chest x-ray to rule out aspiration pneumonia however patient decided to leave AGAINST MEDICAL ADVICE. Hopefully patient will followup with PCP soon.  Procedures:  Chest x-ray 08/20/2013   Consultations:  None  Discharge Exam: Filed Vitals:   08/21/13 1403  BP: 116/64  Pulse: 103  Temp: 98 F (36.7 C)  Resp: 16    General: NAD Cardiovascular: RRR Respiratory: tight, inspiratory and expiratory wheezing,No rhonchi   Discharge Instructions     Medication List    ASK your doctor about these medications       albuterol 108 (90 BASE) MCG/ACT inhaler  Commonly known as:  PROVENTIL HFA;VENTOLIN HFA  Inhale 2 puffs into the lungs every 4 (four) hours as needed for shortness of breath.       No Known Allergies    The results of significant diagnostics from this hospitalization (including imaging, microbiology, ancillary and laboratory) are listed below for reference.    Significant Diagnostic Studies:  Dg Chest 2 View  08/20/2013   CLINICAL DATA:  Cough. Shortness of breath. Mid chest pain.  EXAM: CHEST  2 VIEW  COMPARISON:  12/16/2008  FINDINGS: The heart size and mediastinal contours are within normal limits. Both lungs are clear. 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 Bony thorax is otherwise intact.  IMPRESSION: No  active cardiopulmonary disease.   Electronically Signed   By: Amie Portland M.D.   On: 08/20/2013 12:39    Microbiology: No results found for this or any previous visit (from the past 240 hour(s)).   Labs: Basic Metabolic Panel:  Recent Labs Lab 08/20/13 1200 08/21/13 0525  NA 134* 136  K 4.3 3.7  CL 98 103  CO2 29 26  GLUCOSE 123* 185*  BUN 15 10  CREATININE 0.91 0.54  CALCIUM 8.9 8.4   Liver Function Tests:  Recent Labs Lab 08/20/13 1200 08/21/13 0525  AST 24 22  ALT 13 11  ALKPHOS 70 58  BILITOT 0.3 0.3  PROT 7.2 6.3  ALBUMIN 3.7 2.9*   No results found for this basename: LIPASE, AMYLASE,  in the last 168 hours No results found for this basename: AMMONIA,  in the last 168 hours CBC:  Recent Labs Lab 08/20/13 1200 08/21/13 0525  WBC 17.7* 19.0*  NEUTROABS 15.9*  --   HGB 12.0 9.7*  HCT 38.2 30.7*  MCV 88.2 88.0  PLT 471* 349   Cardiac Enzymes: No results found for this basename: CKTOTAL, CKMB, CKMBINDEX, TROPONINI,  in the last 168 hours BNP: BNP (last 3 results) No results found for this basename: PROBNP,  in the last 8760 hours CBG: No results found for this basename: GLUCAP,  in the last 168 hours     Signed:  THOMPSON,DANIEL  [facility] 08/21/2013, 5:28 PM

## 2013-08-22 LAB — URINE CULTURE
Colony Count: NO GROWTH
Culture: NO GROWTH

## 2013-09-08 ENCOUNTER — Emergency Department (HOSPITAL_COMMUNITY): Payer: Self-pay

## 2013-09-08 ENCOUNTER — Inpatient Hospital Stay (HOSPITAL_COMMUNITY)
Admission: EM | Admit: 2013-09-08 | Discharge: 2013-09-13 | DRG: 176 | Disposition: A | Discharge status: Home / Self Care | Payer: Self-pay | Attending: Internal Medicine | Admitting: Internal Medicine

## 2013-09-08 ENCOUNTER — Encounter (HOSPITAL_COMMUNITY): Payer: Self-pay | Admitting: Emergency Medicine

## 2013-09-08 DIAGNOSIS — D649 Anemia, unspecified: Secondary | ICD-10-CM

## 2013-09-08 DIAGNOSIS — F17200 Nicotine dependence, unspecified, uncomplicated: Secondary | ICD-10-CM | POA: Diagnosis present

## 2013-09-08 DIAGNOSIS — E162 Hypoglycemia, unspecified: Secondary | ICD-10-CM | POA: Diagnosis present

## 2013-09-08 DIAGNOSIS — F1910 Other psychoactive substance abuse, uncomplicated: Secondary | ICD-10-CM

## 2013-09-08 DIAGNOSIS — I82409 Acute embolism and thrombosis of unspecified deep veins of unspecified lower extremity: Secondary | ICD-10-CM

## 2013-09-08 DIAGNOSIS — I824Z9 Acute embolism and thrombosis of unspecified deep veins of unspecified distal lower extremity: Secondary | ICD-10-CM | POA: Diagnosis present

## 2013-09-08 DIAGNOSIS — R0902 Hypoxemia: Secondary | ICD-10-CM

## 2013-09-08 DIAGNOSIS — J9600 Acute respiratory failure, unspecified whether with hypoxia or hypercapnia: Secondary | ICD-10-CM

## 2013-09-08 DIAGNOSIS — I824Y9 Acute embolism and thrombosis of unspecified deep veins of unspecified proximal lower extremity: Secondary | ICD-10-CM | POA: Diagnosis present

## 2013-09-08 DIAGNOSIS — J45909 Unspecified asthma, uncomplicated: Secondary | ICD-10-CM | POA: Insufficient documentation

## 2013-09-08 DIAGNOSIS — I82402 Acute embolism and thrombosis of unspecified deep veins of left lower extremity: Secondary | ICD-10-CM

## 2013-09-08 DIAGNOSIS — F1110 Opioid abuse, uncomplicated: Secondary | ICD-10-CM | POA: Diagnosis present

## 2013-09-08 DIAGNOSIS — J40 Bronchitis, not specified as acute or chronic: Secondary | ICD-10-CM

## 2013-09-08 DIAGNOSIS — I2699 Other pulmonary embolism without acute cor pulmonale: Principal | ICD-10-CM

## 2013-09-08 DIAGNOSIS — J45901 Unspecified asthma with (acute) exacerbation: Secondary | ICD-10-CM

## 2013-09-08 DIAGNOSIS — G9340 Encephalopathy, unspecified: Secondary | ICD-10-CM

## 2013-09-08 DIAGNOSIS — Z801 Family history of malignant neoplasm of trachea, bronchus and lung: Secondary | ICD-10-CM

## 2013-09-08 DIAGNOSIS — R4689 Other symptoms and signs involving appearance and behavior: Secondary | ICD-10-CM

## 2013-09-08 HISTORY — DX: Ulcerative colitis, unspecified, without complications: K51.90

## 2013-09-08 HISTORY — DX: Unspecified ovarian cyst, left side: N83.202

## 2013-09-08 HISTORY — DX: Unspecified ovarian cyst, right side: N83.201

## 2013-09-08 LAB — COMPREHENSIVE METABOLIC PANEL
ALT: 12 U/L (ref 0–35)
Albumin: 3 g/dL — ABNORMAL LOW (ref 3.5–5.2)
Alkaline Phosphatase: 63 U/L (ref 39–117)
Calcium: 8.7 mg/dL (ref 8.4–10.5)
Chloride: 97 mEq/L (ref 96–112)
Potassium: 3.8 mEq/L (ref 3.5–5.1)
Sodium: 136 mEq/L (ref 135–145)
Total Bilirubin: 0.2 mg/dL — ABNORMAL LOW (ref 0.3–1.2)
Total Protein: 6.7 g/dL (ref 6.0–8.3)

## 2013-09-08 LAB — CBC WITH DIFFERENTIAL/PLATELET
Basophils Absolute: 0 10*3/uL (ref 0.0–0.1)
Basophils Relative: 0 % (ref 0–1)
Eosinophils Absolute: 0.3 10*3/uL (ref 0.0–0.7)
Eosinophils Relative: 2 % (ref 0–5)
HCT: 34.5 % — ABNORMAL LOW (ref 36.0–46.0)
Hemoglobin: 10.9 g/dL — ABNORMAL LOW (ref 12.0–15.0)
Lymphocytes Relative: 17 % (ref 12–46)
MCH: 27.3 pg (ref 26.0–34.0)
MCHC: 31.6 g/dL (ref 30.0–36.0)
MCV: 86.3 fL (ref 78.0–100.0)
Monocytes Absolute: 0.9 10*3/uL (ref 0.1–1.0)
RDW: 13.4 % (ref 11.5–15.5)
WBC: 13.8 10*3/uL — ABNORMAL HIGH (ref 4.0–10.5)

## 2013-09-08 LAB — APTT: aPTT: 36 seconds (ref 24–37)

## 2013-09-08 LAB — RAPID URINE DRUG SCREEN, HOSP PERFORMED
Amphetamines: NOT DETECTED
Benzodiazepines: POSITIVE — AB

## 2013-09-08 LAB — PROTIME-INR: Prothrombin Time: 14.9 seconds (ref 11.6–15.2)

## 2013-09-08 MED ORDER — ONDANSETRON HCL 4 MG/2ML IJ SOLN: 4.0000 mg | INTRAMUSCULAR | Status: DC | PRN

## 2013-09-08 MED ORDER — ACETAMINOPHEN 325 MG PO TABS: 650.0000 mg | ORAL_TABLET | ORAL | Status: DC | PRN

## 2013-09-08 MED ORDER — IBUPROFEN 600 MG PO TABS
600.0000 mg | ORAL_TABLET | Freq: Four times a day (QID) | ORAL | Status: DC | PRN
  Administered 2013-09-09: 600 mg via ORAL
  Filled 2013-09-08: qty 1

## 2013-09-08 MED ORDER — SODIUM CHLORIDE 0.9 % IV SOLN: INTRAVENOUS | Status: DC

## 2013-09-08 MED ORDER — ACETAMINOPHEN 500 MG PO TABS
1000.0000 mg | ORAL_TABLET | Freq: Once | ORAL | Status: AC
  Administered 2013-09-08: 1000 mg via ORAL

## 2013-09-08 MED ORDER — DEXTROSE 5 % IV SOLN
1.0000 g | Freq: Once | INTRAVENOUS | Status: AC
  Administered 2013-09-08: 1 g via INTRAVENOUS
  Filled 2013-09-08: qty 10

## 2013-09-08 MED ORDER — SODIUM CHLORIDE 0.9 % IJ SOLN
3.0000 mL | Freq: Two times a day (BID) | INTRAMUSCULAR | Status: DC
  Administered 2013-09-10 – 2013-09-13 (×7): 3 mL via INTRAVENOUS

## 2013-09-08 MED ORDER — ACETAMINOPHEN 500 MG PO TABS
ORAL_TABLET | ORAL | Status: AC
  Administered 2013-09-08: 1000 mg via ORAL
  Filled 2013-09-08: qty 2

## 2013-09-08 MED ORDER — PANTOPRAZOLE SODIUM 40 MG PO TBEC
40.0000 mg | DELAYED_RELEASE_TABLET | Freq: Every day | ORAL | Status: DC
  Administered 2013-09-09 – 2013-09-13 (×5): 40 mg via ORAL
  Filled 2013-09-08 (×5): qty 1

## 2013-09-08 MED ORDER — ENOXAPARIN SODIUM 80 MG/0.8ML ~~LOC~~ SOLN
1.0000 mg/kg | Freq: Two times a day (BID) | SUBCUTANEOUS | Status: DC
  Administered 2013-09-09 – 2013-09-10 (×4): 80 mg via SUBCUTANEOUS
  Filled 2013-09-08 (×4): qty 0.8

## 2013-09-08 MED ORDER — OXYCODONE HCL 5 MG PO TABS
5.0000 mg | ORAL_TABLET | ORAL | Status: DC | PRN
  Administered 2013-09-08 – 2013-09-09 (×2): 5 mg via ORAL
  Filled 2013-09-08 (×2): qty 1

## 2013-09-08 MED ORDER — DM-GUAIFENESIN ER 30-600 MG PO TB12
1.0000 | ORAL_TABLET | Freq: Once | ORAL | Status: AC
  Administered 2013-09-08: 1 via ORAL
  Filled 2013-09-08: qty 1

## 2013-09-08 MED ORDER — GUAIFENESIN ER 600 MG PO TB12
1200.0000 mg | ORAL_TABLET | Freq: Two times a day (BID) | ORAL | Status: DC
  Administered 2013-09-08 – 2013-09-12 (×8): 1200 mg via ORAL
  Filled 2013-09-08 (×10): qty 2

## 2013-09-08 MED ORDER — WARFARIN SODIUM 10 MG PO TABS
10.0000 mg | ORAL_TABLET | Freq: Once | ORAL | Status: AC
  Administered 2013-09-08: 10 mg via ORAL
  Filled 2013-09-08: qty 1

## 2013-09-08 MED ORDER — TRAZODONE HCL 50 MG PO TABS
100.0000 mg | ORAL_TABLET | Freq: Every evening | ORAL | Status: DC | PRN
  Administered 2013-09-08 – 2013-09-11 (×3): 100 mg via ORAL
  Filled 2013-09-08 (×3): qty 2

## 2013-09-08 MED ORDER — ALBUTEROL SULFATE (5 MG/ML) 0.5% IN NEBU
2.5000 mg | INHALATION_SOLUTION | RESPIRATORY_TRACT | Status: DC
  Administered 2013-09-08 – 2013-09-09 (×2): 2.5 mg via RESPIRATORY_TRACT
  Filled 2013-09-08 (×3): qty 0.5

## 2013-09-08 MED ORDER — PREDNISONE 50 MG PO TABS
60.0000 mg | ORAL_TABLET | Freq: Once | ORAL | Status: AC
  Administered 2013-09-08: 17:00:00 60 mg via ORAL
  Filled 2013-09-08 (×2): qty 1

## 2013-09-08 MED ORDER — FLEET ENEMA 7-19 GM/118ML RE ENEM: 1.0000 | ENEMA | Freq: Once | RECTAL | Status: AC | PRN

## 2013-09-08 MED ORDER — ALBUTEROL (5 MG/ML) CONTINUOUS INHALATION SOLN
15.0000 mg/h | INHALATION_SOLUTION | Freq: Once | RESPIRATORY_TRACT | Status: AC
  Administered 2013-09-08: 15 mg/h via RESPIRATORY_TRACT
  Filled 2013-09-08: qty 20

## 2013-09-08 MED ORDER — WARFARIN - PHYSICIAN DOSING INPATIENT: Freq: Every day | Status: DC

## 2013-09-08 MED ORDER — GABAPENTIN 300 MG PO CAPS
300.0000 mg | ORAL_CAPSULE | Freq: Three times a day (TID) | ORAL | Status: DC
  Administered 2013-09-08 – 2013-09-13 (×14): 300 mg via ORAL
  Filled 2013-09-08 (×14): qty 1

## 2013-09-08 MED ORDER — POTASSIUM CHLORIDE IN NACL 20-0.9 MEQ/L-% IV SOLN
INTRAVENOUS | Status: DC
  Administered 2013-09-08 – 2013-09-10 (×4): via INTRAVENOUS

## 2013-09-08 MED ORDER — TRAMADOL HCL 50 MG PO TABS
ORAL_TABLET | ORAL | Status: AC
  Administered 2013-09-08: 100 mg via ORAL
  Filled 2013-09-08: qty 2

## 2013-09-08 MED ORDER — TRAMADOL HCL 50 MG PO TABS
100.0000 mg | ORAL_TABLET | Freq: Once | ORAL | Status: AC
  Administered 2013-09-08: 100 mg via ORAL

## 2013-09-08 MED ORDER — IPRATROPIUM BROMIDE 0.02 % IN SOLN
0.5000 mg | RESPIRATORY_TRACT | Status: DC
  Administered 2013-09-08 – 2013-09-09 (×2): 0.5 mg via RESPIRATORY_TRACT
  Filled 2013-09-08 (×3): qty 2.5

## 2013-09-08 MED ORDER — DEXTROSE 5 % IV SOLN
500.0000 mg | INTRAVENOUS | Status: DC
  Administered 2013-09-08 – 2013-09-09 (×2): 500 mg via INTRAVENOUS
  Filled 2013-09-08: qty 500

## 2013-09-08 MED ORDER — PREDNISONE 20 MG PO TABS
50.0000 mg | ORAL_TABLET | Freq: Every day | ORAL | Status: DC
  Administered 2013-09-09: 50 mg via ORAL
  Filled 2013-09-08: qty 2
  Filled 2013-09-08: qty 1

## 2013-09-08 MED ORDER — ENOXAPARIN SODIUM 100 MG/ML ~~LOC~~ SOLN
80.0000 mg | Freq: Once | SUBCUTANEOUS | Status: AC
  Administered 2013-09-08: 80 mg via SUBCUTANEOUS
  Filled 2013-09-08: qty 1

## 2013-09-08 MED ORDER — IPRATROPIUM BROMIDE 0.02 % IN SOLN
0.5000 mg | Freq: Once | RESPIRATORY_TRACT | Status: AC
  Administered 2013-09-08: 0.5 mg via RESPIRATORY_TRACT
  Filled 2013-09-08: qty 2.5

## 2013-09-08 MED ORDER — ALBUTEROL SULFATE (5 MG/ML) 0.5% IN NEBU: 5.0000 mg | INHALATION_SOLUTION | RESPIRATORY_TRACT | Status: DC | PRN

## 2013-09-08 MED ORDER — WARFARIN SODIUM 10 MG PO TABS
ORAL_TABLET | ORAL | Status: AC
  Filled 2013-09-08: qty 1

## 2013-09-08 MED ORDER — SULFASALAZINE 500 MG PO TABS
1000.0000 mg | ORAL_TABLET | Freq: Two times a day (BID) | ORAL | Status: DC
  Administered 2013-09-09 – 2013-09-13 (×10): 1000 mg via ORAL
  Filled 2013-09-08 (×14): qty 2

## 2013-09-08 MED ORDER — PHENYTOIN SODIUM EXTENDED 100 MG PO CAPS
300.0000 mg | ORAL_CAPSULE | Freq: Every day | ORAL | Status: AC
  Administered 2013-09-09 – 2013-09-13 (×5): 300 mg via ORAL
  Filled 2013-09-08 (×5): qty 3

## 2013-09-08 MED ORDER — BISACODYL 5 MG PO TBEC
5.0000 mg | DELAYED_RELEASE_TABLET | Freq: Every day | ORAL | Status: DC | PRN
  Administered 2013-09-09: 5 mg via ORAL
  Filled 2013-09-08: qty 1

## 2013-09-08 MED ORDER — SODIUM CHLORIDE 0.9 % IV SOLN
INTRAVENOUS | Status: DC
  Administered 2013-09-08: 1000 mL via INTRAVENOUS

## 2013-09-08 NOTE — ED Notes (Signed)
Pt placed on 2l O2 by nasal canula after ambulating. Room air sats staying around 84%. EDP notified.

## 2013-09-08 NOTE — H&P (Signed)
[facility] History and Physical  SHARRELL KRAWIEC  RUE:[id number]  DOB: August 25, 1971   DOA: 09/08/2013   PCP:   No primary provider on file.   Chief Complaint:  Pain in the left leg for 2 weeks Shortness of breath for 2 weeks   HPI: Melanie DELPRIORE is a 42 y.o. female.   History of asthma and polysubstance abuse Admitted to the service 3 weeks ago for overdose with benzodiazepines and opiates, and signed out AGAINST MEDICAL ADVICE following day. Now comes to the emergency room complaining of shortness of breath and pain and swelling of the left leg for 2 weeks. In the emergency room she received initial treatment for asthma exacerbation including antibiotics and steroids, and Doppler of the left lower extremity revealed the popliteal vein DVT. She's been started on Lovenox and Coumadin  Patient's most pressing concern is pain medication; she has been advised that continued drug abuse will not be supported and facilitate while she is in hospital; she then became tearful and said she wants help in coming off drugs and wishes to be referred to rehabilitation discharge.  Having hot flash Hot flashes  She insists she has never smoked today in her life, although her red or records list her as being a daily tobacco user  Has been  using Percocet 7-8 x 10mg  per day,  from the street  Xanax 4 tabs of 2 mg/day, from the street    Rewiew of Systems:   All systems negative except as marked bold or noted in the HPI;  Constitutional:    malaise, fever and chills. ;  Eyes:   eye pain, redness and discharge. ;  ENMT:   ear pain, hoarseness, nasal congestion, sinus pressure and sore throat. ;  Cardiovascular:    chest pain, palpitations, diaphoresis, dyspnea and peripheral edema.  Respiratory:   cough, hemoptysis, wheezing and stridor. ;  Gastrointestinal:  nausea, vomiting, diarrhea, constipation, abdominal pain, melena, blood in stool, hematemesis, jaundice and rectal bleeding. unusual weight  loss..   Genitourinary:    frequency, dysuria, incontinence,flank pain and hematuria; Musculoskeletal:   back pain and neck pain.  swelling and trauma.;  Skin: .  pruritus, rash, abrasions, bruising and skin lesion.; ulcerations Neuro:    headache, lightheadedness and neck stiffness.  weakness, altered level of consciousness, altered mental status, extremity weakness, burning feet, involuntary movement, seizure and syncope.  Psych:    anxiety, depression, insomnia, tearfulness, panic attacks, hallucinations, paranoia, suicidal or homicidal ideation    Past Medical History  Diagnosis Date  . Asthma   . Ulcerative colitis   . Bilateral ovarian cysts     Past Surgical History  Procedure Laterality Date  . Back surgery      Medications:  HOME MEDS: Prior to Admission medications   Medication Sig Start Date End Date Taking? Authorizing Provider  sulfaSALAzine (AZULFIDINE) 500 MG tablet Take 1,000 mg by mouth 2 (two) times daily.   Yes Historical Provider, MD  albuterol (PROVENTIL HFA;VENTOLIN HFA) 108 (90 BASE) MCG/ACT inhaler Inhale 2 puffs into the lungs every 4 (four) hours as needed for shortness of breath.     Historical Provider, MD     Allergies:  No Known Allergies  Social History:   reports that she has been smoking Cigarettes.  She has been smoking about 21.00 packs per day. She does not have any smokeless tobacco history on file. She reports that she uses illicit drugs (Marijuana). She reports that she does not drink alcohol.  Family History: Family History  Problem Relation Age of Onset  . Lung cancer Mother      Physical Exam: Filed Vitals:   09/08/13 1800 09/08/13 1816 09/08/13 1944 09/08/13 2150  BP: 117/72  115/61 123/67  Pulse:   106 107  Temp:    98 F (36.7 C)  TempSrc:      Resp: 15  20 20   Height:    5\' 7"  (1.702 m)  Weight:    79.9 kg (176 lb 2.4 oz)  SpO2:  90% 87% 93%   Blood pressure 123/67, pulse 107, temperature 98 F (36.7 C),  temperature source Oral, resp. rate 20, height 5\' 7"  (1.702 m), weight 79.9 kg (176 lb 2.4 oz), last menstrual period 09/01/2013, SpO2 93.00%. Body mass index is 27.58 kg/(m^2).   GEN:  Pleasant but somewhat agitated Caucasian lady reclining bed in no acute distress; cooperative with exam PSYCH:  alert and oriented x4;  anxious and depressed; affect is appropriate. HEENT: Mucous membranes pink, dry, and anicteric; PERRLA; EOM intact; no cervical lymphadenopathy nor thyromegaly or carotid bruit; no JVD; Breasts:: Not examined CHEST WALL: No tenderness CHEST: Normal respiration, clear to auscultation bilaterally; however she is forcibly trying to make herself wheeze by forcing expiration HEART: Regular rate and rhythm; no murmurs rubs or gallops BACK: No kyphosis no scoliosis; no CVA tenderness ABDOMEN: Obese, soft non-tender; no masses, no organomegaly, normal abdominal bowel sounds; no pannus; no intertriginous candida. Rectal Exam: Not done EXTREMITIES: Left leg slightly swollen below the knee compared to right; tenderness of the car no swelling or tenderness of the thigh; no ulcerations. Genitalia: not examined PULSES: 2+ and symmetric SKIN: Normal hydration no rash or ulceration CNS: Cranial nerves 2-12 grossly intact no focal lateralizing neurologic deficit   Labs on Admission:  Basic Metabolic Panel:  Recent Labs Lab 09/08/13 1825  NA 136  K 3.8  CL 97  CO2 31  GLUCOSE 200*  BUN 7  CREATININE 0.65  CALCIUM 8.7   Liver Function Tests:  Recent Labs Lab 09/08/13 1825  AST 15  ALT 12  ALKPHOS 63  BILITOT 0.2*  PROT 6.7  ALBUMIN 3.0*   No results found for this basename: LIPASE, AMYLASE,  in the last 168 hours No results found for this basename: AMMONIA,  in the last 168 hours CBC:  Recent Labs Lab 09/08/13 1825  WBC 13.8*  NEUTROABS 10.2*  HGB 10.9*  HCT 34.5*  MCV 86.3  PLT 346   Cardiac Enzymes: No results found for this basename: CKTOTAL, CKMB,  CKMBINDEX, TROPONINI,  in the last 168 hours BNP: No components found with this basename: POCBNP,  D-dimer: No components found with this basename: D-DIMER,  CBG: No results found for this basename: GLUCAP,  in the last 168 hours  Radiological Exams on Admission: Dg Chest 2 View  09/08/2013   CLINICAL DATA:  Shortness of breath, cough.  EXAM: CHEST  2 VIEW  COMPARISON:  August 20, 2013.  FINDINGS: The heart size and mediastinal contours are within normal limits. No pleural effusion or pneumothorax is noted. Both lungs are clear. The visualized skeletal structures are unremarkable.  IMPRESSION: No active cardiopulmonary disease.   Electronically Signed   By: Roque Lias M.D.   On: 09/08/2013 17:05   US Venous Img Lower Unilateral Left  09/08/2013   CLINICAL DATA:  Left calf discomfort.  EXAM: Left LOWER EXTREMITY VENOUS DOPPLER ULTRASOUND  TECHNIQUE: Gray-scale sonography with graded compression, as well as color Doppler and duplex  ultrasound, were performed to evaluate the deep venous system from the level of the common femoral vein through the popliteal and proximal calf veins. Spectral Doppler was utilized to evaluate flow at rest and with distal augmentation maneuvers.  COMPARISON:  None.  FINDINGS: Thrombus within deep veins: There is thrombus within the popliteal vein, posterior tibial vein, peroneal vein, and gastrocnemius vein. Evaluation of the anterior tibial vein is limited. There is no evidence of thrombus within the common femoral or superficial or deep chondral femoral veins. The greater saphenous vein appears patent.  Other findings:  None visualized.  IMPRESSION: There is deep venous thrombosis involving the left popliteal veins and the veins of the upper left calf.   Electronically Signed   By: David  [place]   On: 09/08/2013 17:23       Assessment/Plan   Active Problems:   Asthma exacerbation  Nebulizations, antibiotics, mucolytics   Polysubstance abuse  Neurontin,  Trazodone, and Dilantin; referred for inpatient rehabilitation; may need Klonopin taper   Narcotic abuse  Neurontin; may need narcotic replacement if withdrawal begin   Hypoxia  Treat asthma exacerbation   DVT of leg (deep venous thrombosis)  Lovenox and warfarin from   Anemia  Further investigation per primary team Hypoglycemia ; followup hemoglobin A1c    PLAN:    Other plans as per orders.  Code Status: full   Yenny Kosa Nocturnist [facility] Pager [phone]   09/08/2013, 10:28 PM

## 2013-09-08 NOTE — ED Provider Notes (Signed)
CSN: [id number]     Arrival date & time 09/08/13  1615 History  This chart was scribed for Ward Givens, MD by Shari Heritage, ED Scribe. The patient was seen in room APA04/APA04. Patient's care was started at 4:41 PM.    Chief Complaint  Patient presents with  . Shortness of Breath    The history is provided by the patient. No language interpreter was used.    HPI Comments: Melanie Mendoza is a 42 y.o. female with a history of asthma who presents to the Emergency Department complaining of constant shortness of breath onset two weeks ago that has worsened within the past week. Shortness of breath is worse with exertion. There is associated cough productive of brown and green sputum, wheezing, subjective fever, congestion and rhinorrhea. She does not have health insurance and says that she has been unable to afford her inhalers and nebulizers. She states that she procured an inhaler from someone but it ran out yesterday. She has been admitted to the hospital for asthma exacerbation in the past and the last time was a couple of years ago.   She is also complaining of left lower leg pain that radiates to her foot for the past 1 month that has recently worsened. She denies any obvious injury or trauma to the leg. She states that she did not take any long car or plane trips prior to pain onset. She says that she has been lying in bed a lot recently due to not feeling well, but the pain began prior to recent illness. She has no history of DVT/PE.  PCP None  Past Medical History  Diagnosis Date  . Asthma   . Ulcerative colitis   . Bilateral ovarian cysts    Past Surgical History  Procedure Laterality Date  . Back surgery     Family History  Problem Relation Age of Onset  . Lung cancer Mother    History  Substance Use Topics  . Smoking status: Current Every Day Smoker -- 21.00 packs/day    Types: Cigarettes  . Smokeless tobacco: Not on file  . Alcohol Use: No  She states that she has never  smoked. 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 + second hand smoke  OB History   Grav Para Term Preterm Abortions TAB SAB Ect Mult Living            2     Review of Systems  Constitutional: Positive for fever (subjective).  HENT: Positive for congestion and rhinorrhea.   Respiratory: Positive for cough, shortness of breath and wheezing.   Musculoskeletal: Positive for myalgias (left lower leg).  All other systems reviewed and are negative.    Allergies  Review of patient's allergies indicates no known allergies.  Home Medications   Current Outpatient Rx  Name  Route  Sig  Dispense  Refill  . albuterol (PROVENTIL HFA;VENTOLIN HFA) 108 (90 BASE) MCG/ACT inhaler   Inhalation   Inhale 2 puffs into the lungs every 4 (four) hours as needed for shortness of breath.           Triage Vitals: BP 137/71  Pulse 116  Temp(Src) 98.4 F (36.9 C) (Oral)  Resp 20  Ht 5\' 7"  (1.702 m)  Wt 175 lb (79.379 kg)  BMI 27.40 kg/m2  SpO2 93%  LMP 09/01/2013  Vital signs normal except tachycardia and tachypnea  Physical Exam  Nursing note and vitals reviewed. Constitutional: She is oriented to person,  place, and time. She appears well-developed and well-nourished.  Non-toxic appearance. She does not appear ill. No distress.  HENT:  Head: Normocephalic and atraumatic.  Right Ear: External ear normal.  Left Ear: External ear normal.  Nose: Nose normal. No mucosal edema or rhinorrhea.  Mouth/Throat: Oropharynx is clear and moist and mucous membranes are normal. No dental abscesses or uvula swelling.  Eyes: Conjunctivae and EOM are normal. Pupils are equal, round, and reactive to light.  Neck: Normal range of motion and full passive range of motion without pain. Neck supple.  Cardiovascular: Normal rate, regular rhythm and normal heart sounds.  Exam reveals no gallop and no friction rub.   No murmur heard. Pulmonary/Chest: Effort normal. No respiratory distress. She  has decreased breath sounds. She has wheezes. She has rhonchi. She has no rales. She exhibits no tenderness and no crepitus.  Coughing frequently especially when she tries to breathe deep. Audible wheezing at times. Very diminished breath sounds with wheezing and rhonchi.  Abdominal: Soft. Normal appearance and bowel sounds are normal. She exhibits no distension. There is no tenderness. There is no rebound and no guarding.  Musculoskeletal: Normal range of motion. She exhibits tenderness. She exhibits no edema.  Moves all extremities well. Tender diffusely in the left calf and along the insertion of the Achilles tendon. Left lower leg without obvious swelling compared to the right. No redness or warmth to the skin.  Neurological: She is alert and oriented to person, place, and time. She has normal strength. No cranial nerve deficit.  Skin: Skin is warm, dry and intact. No rash noted. No erythema. No pallor.  Psychiatric: She has a normal mood and affect. Her speech is normal and behavior is normal. Her mood appears not anxious.    ED Course  Procedures (including critical care time)  Medications  Warfarin - Physician Dosing Inpatient (not administered)  0.9 %  sodium chloride infusion ( Intravenous Transfusing/Transfer 09/08/13 2133)  azithromycin (ZITHROMAX) 500 mg in dextrose 5 % 250 mL IVPB (500 mg Intravenous Transfusing/Transfer 09/08/13 2134)  0.9 %  sodium chloride infusion (not administered)  albuterol (PROVENTIL) (5 MG/ML) 0.5% nebulizer solution 5 mg (not administered)  predniSONE (DELTASONE) tablet 60 mg (60 mg Oral Given 09/08/13 1716)  albuterol (PROVENTIL,VENTOLIN) solution continuous neb (15 mg/hr Nebulization Given 09/08/13 1740)  ipratropium (ATROVENT) nebulizer solution 0.5 mg (0.5 mg Nebulization Given 09/08/13 1740)  enoxaparin (LOVENOX) injection 80 mg (80 mg Subcutaneous Given 09/08/13 1756)  acetaminophen (TYLENOL) tablet 1,000 mg (1,000 mg Oral Given 09/08/13 1809)   traMADol (ULTRAM) tablet 100 mg (100 mg Oral Given 09/08/13 1809)  warfarin (COUMADIN) tablet 10 mg (10 mg Oral Given 09/08/13 1919)  dextromethorphan-guaiFENesin (MUCINEX DM) 30-600 MG per 12 hr tablet 1 tablet (1 tablet Oral Given 09/08/13 1904)  cefTRIAXone (ROCEPHIN) 1 g in dextrose 5 % 50 mL IVPB (0 g Intravenous Stopped 09/08/13 2111)    DIAGNOSTIC STUDIES: Oxygen Saturation is 93% on room air, adequate by my interpretation.    COORDINATION OF CARE: 4:49 PM- Will order prednisone and breathing treatments as well as chest x-ray and [place] of left lower leg. Patient informed of current plan for treatment and evaluation and agrees with plan at this time.   5:49 PM- Patient updated about imaging studies. Chest x-ray negative for disease. US showed DVT in left calf. Patient was informed that because this clot is below the knee she will not require hopsitalization. We will give Lovenox and Coumadin to treat clot. I advised her  of the plan to treat her shortness of breath in the ED before discharging her home. We discussed the importance of follow up regarding DVT and I will refer her to the health department given that she does not have insurance or a PCP.  6:53 PM- Patient states that breathing has improved after treatments. Upon exam, she has much improved air movement without wheezing or rhonci but she coughs frequently when she breathes deep.  Review of patient's chart so she was admitted November 7 through November 8 when she overdosed on Xanax and chewing a fentanyl patch. This was to prevent the police finding the drugs on her. She does admit that she has a drug problem and states she is going to [facility]. However she is very eager to convince me that it would be no problem for her to be put on pain medicine for her leg, although she states the incident described "made me go back to zero" and start rehab again. Also of note she had been wheezing at that admission and said she had a cough and wheezing  for 2 weeks at that time. She was given heparin subcutaneous prophylaxis while in the ED to prevent DVT. At one point in the ED her pulse ox dropped to 80% and she was given Narcan, after that she was admitted to the hospital and left the next day AMA.  Review of the [place] shows no prescriptions filled in the past 6 months.  8:11 PM- Patient's O2 saturation dropped from 90% to 81% while ambulating in the hall so was placed on 2L of oxygen by [place] after ambulating per nursing report. Will place consult for admission.  When discussed admission with patient she again complains of a lot of pain in her leg. When asked how she is tolerating the pain at home because she's had this for a month she admits she has been getting narcotics off the street.  10:18 Dr Orvan Falconer, admit to tele   Results for orders placed during the hospital encounter of 09/08/13  APTT      Result Value Range   aPTT 36  24 - 37 seconds  PROTIME-INR      Result Value Range   Prothrombin Time 14.9  11.6 - 15.2 seconds   INR 1.20  0.00 - 1.49  COMPREHENSIVE METABOLIC PANEL      Result Value Range   Sodium 136  135 - 145 mEq/L   Potassium 3.8  3.5 - 5.1 mEq/L   Chloride 97  96 - 112 mEq/L   CO2 31  19 - 32 mEq/L   Glucose, Bld 200 (*) 70 - 99 mg/dL   BUN 7  6 - 23 mg/dL   Creatinine, Ser 3.08  0.50 - 1.10 mg/dL   Calcium 8.7  8.4 - 65.7 mg/dL   Total Protein 6.7  6.0 - 8.3 g/dL   Albumin 3.0 (*) 3.5 - 5.2 g/dL   AST 15  0 - 37 U/L   ALT 12  0 - 35 U/L   Alkaline Phosphatase 63  39 - 117 U/L   Total Bilirubin 0.2 (*) 0.3 - 1.2 mg/dL   GFR calc non Af Amer >90  >90 mL/min   GFR calc Af Amer >90  >90 mL/min  CBC WITH DIFFERENTIAL      Result Value Range   WBC 13.8 (*) 4.0 - 10.5 K/uL   RBC 4.00  3.87 - 5.11 MIL/uL   Hemoglobin 10.9 (*) 12.0 - 15.0 g/dL  HCT 34.5 (*) 36.0 - 46.0 %   MCV 86.3  78.0 - 100.0 fL   MCH 27.3  26.0 - 34.0 pg   MCHC 31.6  30.0 - 36.0 g/dL   RDW 08.6  57.8 - 46.9 %    Platelets 346  150 - 400 K/uL   Neutrophils Relative % 74  43 - 77 %   Neutro Abs 10.2 (*) 1.7 - 7.7 K/uL   Lymphocytes Relative 17  12 - 46 %   Lymphs Abs 2.4  0.7 - 4.0 K/uL   Monocytes Relative 7  3 - 12 %   Monocytes Absolute 0.9  0.1 - 1.0 K/uL   Eosinophils Relative 2  0 - 5 %   Eosinophils Absolute 0.3  0.0 - 0.7 K/uL   Basophils Relative 0  0 - 1 %   Basophils Absolute 0.0  0.0 - 0.1 K/uL  URINE RAPID DRUG SCREEN (HOSP PERFORMED)      Result Value Range   Opiates POSITIVE (*) NONE DETECTED   Cocaine NONE DETECTED  NONE DETECTED   Benzodiazepines POSITIVE (*) NONE DETECTED   Amphetamines NONE DETECTED  NONE DETECTED   Tetrahydrocannabinol NONE DETECTED  NONE DETECTED   Barbiturates NONE DETECTED  NONE DETECTED   Laboratory interpretation all normal except for leukocytosis, urine drug green positive for narcotics and benzos   Imaging Review Dg Chest 2 View  09/08/2013   CLINICAL DATA:  Shortness of breath, cough.  EXAM: CHEST  2 VIEW  COMPARISON:  August 20, 2013.  FINDINGS: The heart size and mediastinal contours are within normal limits. No pleural effusion or pneumothorax is noted. Both lungs are clear. The visualized skeletal structures are unremarkable.  IMPRESSION: No active cardiopulmonary disease.   Electronically Signed   By: Roque Lias M.D.   On: 09/08/2013 17:05   US Venous Img Lower Unilateral Left  09/08/2013   CLINICAL DATA:  Left calf discomfort.  EXAM: Left LOWER EXTREMITY VENOUS DOPPLER ULTRASOUND  TECHNIQUE: Gray-scale sonography with graded compression, as well as color Doppler and duplex ultrasound, were performed to evaluate the deep venous system from the level of the common femoral vein through the popliteal and proximal calf veins. Spectral Doppler was utilized to evaluate flow at rest and with distal augmentation maneuvers.  COMPARISON:  None.  FINDINGS: Thrombus within deep veins: There is thrombus within the popliteal vein, posterior tibial vein,  peroneal vein, and gastrocnemius vein. Evaluation of the anterior tibial vein is limited. There is no evidence of thrombus within the common femoral or superficial or deep chondral femoral veins. The greater saphenous vein appears patent.  Other findings:  None visualized.  IMPRESSION: There is deep venous thrombosis involving the left popliteal veins and the veins of the upper left calf.   Electronically Signed   By: David  [place]   On: 09/08/2013 17:23    EKG Interpretation    Date/Time:    Ventricular Rate:    PR Interval:    QRS Duration:   QT Interval:    QTC Calculation:   R Axis:     Text Interpretation:              MDM   1. DVT of leg (deep venous thrombosis), left   2. Hypoxia   3. Narcotic abuse   4. Asthma exacerbation   5. Bronchitis     Plan admission   Devoria Albe, MD, FACEP   I personally performed the services described in this documentation, which was  scribed in my presence. The recorded information has been reviewed and considered.  Devoria Albe, MD, Armando Gang    Ward Givens, MD 09/08/13 2204

## 2013-09-08 NOTE — ED Notes (Addendum)
Pt states trying to "work something out about pain" w/ EDP.

## 2013-09-08 NOTE — ED Notes (Addendum)
Sob with cough , wheeze  For 2 weeks, Pain lt lower leg  1 week., out of inhaler since yesterday

## 2013-09-08 NOTE — ED Notes (Signed)
Ambulated pt in the hall, O2 sats went down from 90 starting to 81; pt started coughing while walking

## 2013-09-08 NOTE — ED Notes (Addendum)
AC notified of needed coumadin dose. AC reported would bring dose down as soon as possible.

## 2013-09-08 NOTE — Progress Notes (Signed)
ANTICOAGULATION CONSULT NOTE - Initial Consult  Pharmacy Consult for Lovenox & Warfarin Indication: LLE DVT  No Known Allergies  Patient Measurements: Height: 5\' 7"  (170.2 cm) Weight: 176 lb 2.4 oz (79.9 kg) IBW/kg (Calculated) : 61.6   Vital Signs: Temp: 98 F (36.7 C) (11/26 2150) Temp src: Oral (11/26 1618) BP: 123/67 mmHg (11/26 2150) Pulse Rate: 107 (11/26 2150)  Labs:  Recent Labs  09/08/13 1825  HGB 10.9*  HCT 34.5*  PLT 346  APTT 36  LABPROT 14.9  INR 1.20  CREATININE 0.65    Estimated Creatinine Clearance: 98.6 ml/min (by C-G formula based on Cr of 0.65).   Medical History: Past Medical History  Diagnosis Date  . Asthma   . Ulcerative colitis   . Bilateral ovarian cysts     Medications:  Scheduled:  . [START ON 09/09/2013] albuterol  2.5 mg Nebulization Q4H WA  . azithromycin  500 mg Intravenous Q24H  . enoxaparin (LOVENOX) injection  1 mg/kg Subcutaneous Q12H  . gabapentin  300 mg Oral TID  . guaiFENesin  1,200 mg Oral BID  . [START ON 09/09/2013] ipratropium  0.5 mg Nebulization Q4H WA  . [START ON 09/09/2013] pantoprazole  40 mg Oral Daily  . [START ON 09/09/2013] phenytoin  300 mg Oral Daily  . [START ON 09/09/2013] predniSONE  50 mg Oral Q breakfast  . sodium chloride  3 mL Intravenous Q12H  . sulfaSALAzine  1,000 mg Oral BID   Infusions:  . 0.9 % NaCl with KCl 20 mEq / L     PRN: acetaminophen, bisacodyl, ibuprofen, ondansetron (ZOFRAN) IV, oxyCODONE, sodium phosphate, traZODone  Assessment: 73yr female with recent onset LLE  DVT.  Given one dose warfarin 10mg  by MD 11/26th  Goal of Therapy:  Desire to tx with overlapping Lovenox 1mg /kg Q12h until warfarin / INR therapeutic (minimum 5 days). Desire INR 2-3 x control    Plan:  1.  Daily INR 2.  Lovenox 1mg /kg SQ q12h 3.  Monitor Pltc, and steady state LMW antiXa level 4. Daily warfarin dose 5. Patient teaching  Abigail Marsiglia, Shon Baton 09/08/2013,11:38 PM

## 2013-09-09 DIAGNOSIS — G9340 Encephalopathy, unspecified: Secondary | ICD-10-CM

## 2013-09-09 DIAGNOSIS — F1310 Sedative, hypnotic or anxiolytic abuse, uncomplicated: Secondary | ICD-10-CM

## 2013-09-09 LAB — CBC
HCT: 33.2 % — ABNORMAL LOW (ref 36.0–46.0)
MCH: 27.3 pg (ref 26.0–34.0)
MCHC: 31.6 g/dL (ref 30.0–36.0)
Platelets: 366 10*3/uL (ref 150–400)
RDW: 13.5 % (ref 11.5–15.5)
WBC: 11.5 10*3/uL — ABNORMAL HIGH (ref 4.0–10.5)

## 2013-09-09 LAB — COMPREHENSIVE METABOLIC PANEL
AST: 14 U/L (ref 0–37)
Albumin: 2.9 g/dL — ABNORMAL LOW (ref 3.5–5.2)
Alkaline Phosphatase: 61 U/L (ref 39–117)
BUN: 5 mg/dL — ABNORMAL LOW (ref 6–23)
Calcium: 9.1 mg/dL (ref 8.4–10.5)
Chloride: 101 mEq/L (ref 96–112)
Potassium: 4 mEq/L (ref 3.5–5.1)
Sodium: 139 mEq/L (ref 135–145)
Total Bilirubin: 0.2 mg/dL — ABNORMAL LOW (ref 0.3–1.2)
Total Protein: 7.1 g/dL (ref 6.0–8.3)

## 2013-09-09 LAB — URINALYSIS, ROUTINE W REFLEX MICROSCOPIC
Glucose, UA: >1000 mg/dL — AB
Ketones, ur: NEGATIVE mg/dL
Leukocytes, UA: NEGATIVE
Nitrite: NEGATIVE
Protein, ur: NEGATIVE mg/dL
Urobilinogen, UA: 0.2 mg/dL (ref 0.0–1.0)

## 2013-09-09 LAB — URINE MICROSCOPIC-ADD ON

## 2013-09-09 LAB — PROTIME-INR: Prothrombin Time: 16.3 seconds — ABNORMAL HIGH (ref 11.6–15.2)

## 2013-09-09 MED ORDER — ALBUTEROL SULFATE (5 MG/ML) 0.5% IN NEBU: 2.5000 mg | INHALATION_SOLUTION | RESPIRATORY_TRACT | Status: DC | PRN

## 2013-09-09 MED ORDER — KETOROLAC TROMETHAMINE 30 MG/ML IJ SOLN: 30.0000 mg | Freq: Four times a day (QID) | INTRAMUSCULAR | Status: DC | PRN

## 2013-09-09 MED ORDER — WARFARIN SODIUM 5 MG PO TABS
5.0000 mg | ORAL_TABLET | Freq: Once | ORAL | Status: AC
  Administered 2013-09-09: 5 mg via ORAL
  Filled 2013-09-09: qty 1

## 2013-09-09 MED ORDER — ALBUTEROL SULFATE (5 MG/ML) 0.5% IN NEBU
2.5000 mg | INHALATION_SOLUTION | Freq: Three times a day (TID) | RESPIRATORY_TRACT | Status: DC
  Administered 2013-09-09: 2.5 mg via RESPIRATORY_TRACT
  Filled 2013-09-09: qty 0.5

## 2013-09-09 MED ORDER — WARFARIN - PHARMACIST DOSING INPATIENT
Status: DC
  Administered 2013-09-10: 18:00:00

## 2013-09-09 MED ORDER — IPRATROPIUM BROMIDE 0.02 % IN SOLN
0.5000 mg | Freq: Three times a day (TID) | RESPIRATORY_TRACT | Status: DC
  Administered 2013-09-09: 0.5 mg via RESPIRATORY_TRACT
  Filled 2013-09-09: qty 2.5

## 2013-09-09 MED ORDER — KETOROLAC TROMETHAMINE 15 MG/ML IJ SOLN: 15.0000 mg | Freq: Four times a day (QID) | INTRAMUSCULAR | Status: DC | PRN

## 2013-09-09 MED ORDER — KETOROLAC TROMETHAMINE 30 MG/ML IJ SOLN
30.0000 mg | INTRAMUSCULAR | Status: DC | PRN
  Administered 2013-09-09 – 2013-09-13 (×10): 30 mg via INTRAVENOUS
  Filled 2013-09-09 (×10): qty 1

## 2013-09-09 MED ORDER — COUMADIN BOOK
Freq: Once | Status: AC
  Administered 2013-09-09: 10:00:00
  Filled 2013-09-09: qty 1

## 2013-09-09 MED ORDER — METHYLPREDNISOLONE SODIUM SUCC 125 MG IJ SOLR
60.0000 mg | Freq: Four times a day (QID) | INTRAMUSCULAR | Status: DC
  Administered 2013-09-09 – 2013-09-11 (×7): 60 mg via INTRAVENOUS
  Filled 2013-09-09 (×7): qty 2

## 2013-09-09 MED ORDER — ALBUTEROL SULFATE (5 MG/ML) 0.5% IN NEBU
2.5000 mg | INHALATION_SOLUTION | RESPIRATORY_TRACT | Status: DC
  Administered 2013-09-09 – 2013-09-11 (×9): 2.5 mg via RESPIRATORY_TRACT
  Filled 2013-09-09 (×9): qty 0.5

## 2013-09-09 MED ORDER — IPRATROPIUM BROMIDE 0.02 % IN SOLN: 0.5000 mg | RESPIRATORY_TRACT | Status: DC | PRN

## 2013-09-09 MED ORDER — WARFARIN VIDEO
Freq: Once | Status: AC
  Administered 2013-09-09: 10:00:00

## 2013-09-09 MED ORDER — SULFASALAZINE 500 MG PO TBEC
DELAYED_RELEASE_TABLET | ORAL | Status: AC
  Filled 2013-09-09: qty 2

## 2013-09-09 MED ORDER — IPRATROPIUM BROMIDE 0.02 % IN SOLN
0.5000 mg | RESPIRATORY_TRACT | Status: DC
  Administered 2013-09-09 – 2013-09-11 (×9): 0.5 mg via RESPIRATORY_TRACT
  Filled 2013-09-09 (×9): qty 2.5

## 2013-09-09 MED ORDER — KETOROLAC TROMETHAMINE 10 MG PO TABS
10.0000 mg | ORAL_TABLET | Freq: Four times a day (QID) | ORAL | Status: DC | PRN
  Administered 2013-09-09: 10 mg via ORAL
  Filled 2013-09-09: qty 1

## 2013-09-09 NOTE — Progress Notes (Addendum)
ANTICOAGULATION CONSULT NOTE - Follow Up Consult  Pharmacy Consult for Lovenox--> Coumadin Indication: DVT  No Known Allergies  Patient Measurements: Height: 5\' 7"  (170.2 cm) Weight: 176 lb 2.4 oz (79.9 kg) IBW/kg (Calculated) : 61.6  Vital Signs: Temp: 97.8 F (36.6 C) (11/27 0523) BP: 102/59 mmHg (11/27 0523) Pulse Rate: 87 (11/27 0523)  Labs:  Recent Labs  09/08/13 1825 09/09/13 0600  HGB 10.9* 10.5*  HCT 34.5* 33.2*  PLT 346 366  APTT 36  --   LABPROT 14.9 16.3*  INR 1.20 1.34  CREATININE 0.65 0.52    Estimated Creatinine Clearance: 98.6 ml/min (by C-G formula based on Cr of 0.52).   Medications:  Scheduled:  . albuterol  2.5 mg Nebulization TID  . azithromycin  500 mg Intravenous Q24H  . [COMPLETED] coumadin book   Does not apply Once  . enoxaparin (LOVENOX) injection  1 mg/kg Subcutaneous Q12H  . gabapentin  300 mg Oral TID  . guaiFENesin  1,200 mg Oral BID  . ipratropium  0.5 mg Nebulization TID  . pantoprazole  40 mg Oral Daily  . phenytoin  300 mg Oral Daily  . predniSONE  50 mg Oral Q breakfast  . sodium chloride  3 mL Intravenous Q12H  . sulfaSALAzine  1,000 mg Oral BID  . [COMPLETED] warfarin   Does not apply Once  . [START ON 09/10/2013] Warfarin - Pharmacist Dosing Inpatient   Does not apply Q24H    Assessment: 42 yo F with new LLE DVT.  She is currently on overlap day#2/5 of Lovenox-->warfarin.   Initial dose of warfarin 10mg  given by MD yesterday.  Patient is on phenytoin, sulfasalazine, and Azithromycin which are all potentiators of warfarin so would expect decreased warfarin requirement for this patient.  INR rising to goal.  No bleeding noted.   Goal of Therapy:  INR 2-3 Monitor platelets by anticoagulation protocol: Yes   Plan:  Lovenox 80mg  sq q12h.  Consider change to Lovenox 120mg  sq daily at discharge. Coumadin 5mg  po x1 today Monitor INR daily CBC on MWF   Takelia Urieta, [facility] 09/09/2013,8:57 AM

## 2013-09-09 NOTE — Progress Notes (Signed)
[facility] PROGRESS NOTE  CLEDITH KAMIYA JXB:[id number] DOB: Feb 11, 1971 DOA: 09/08/2013 PCP: No primary provider on file.  Assessment/Plan: 1. DVT 1. On therapeutic anticoagulation 2. No signs of bleeding 3. Monitor INR closely 2. Asthma exacerbation 1. Cont scheduled nebs and steroids 2. Wean O2 as tolerated 3. Polysubstance abuse 1. Pt was recently admitted at [facility] s/p drug overdose 2. Avoid abusive drugs 3. Analgesics with NSAIDs as tolerated.  Code Status: Full Family Communication: Pt in room (indicate person spoken with, relationship, and if by phone, the number) Disposition Plan: Pending   HPI/Subjective: Complains of LE pain and sob/wheezing  Objective: Filed Vitals:   09/08/13 2352 09/09/13 0523 09/09/13 0708 09/09/13 0757  BP:  102/59    Pulse:  87    Temp:  97.8 F (36.6 C)    TempSrc:      Resp:  18    Height:      Weight:      SpO2: 92% 90% 89% 90%    Intake/Output Summary (Last 24 hours) at 09/09/13 1233 Last data filed at 09/09/13 0900  Gross per 24 hour  Intake    510 ml  Output      0 ml  Net    510 ml   Filed Weights   09/08/13 1618 09/08/13 2150  Weight: 79.379 kg (175 lb) 79.9 kg (176 lb 2.4 oz)    Exam:   General:  Awake, in nad  Cardiovascular: regular, s1, s2  Respiratory: wheezing, decreased BS  Abdomen: soft, nondistended  Musculoskeletal: perfused, no clubbing   Data Reviewed: Basic Metabolic Panel:  Recent Labs Lab 09/08/13 1825 09/09/13 0600  NA 136 139  K 3.8 4.0  CL 97 101  CO2 31 28  GLUCOSE 200* 192*  BUN 7 5*  CREATININE 0.65 0.52  CALCIUM 8.7 9.1   Liver Function Tests:  Recent Labs Lab 09/08/13 1825 09/09/13 0600  AST 15 14  ALT 12 12  ALKPHOS 63 61  BILITOT 0.2* 0.2*  PROT 6.7 7.1  ALBUMIN 3.0* 2.9*   No results found for this basename: LIPASE, AMYLASE,  in the last 168 hours No results found for this basename: AMMONIA,  in the last 168 hours CBC:  Recent Labs Lab 09/08/13 1825  09/09/13 0600  WBC 13.8* 11.5*  NEUTROABS 10.2*  --   HGB 10.9* 10.5*  HCT 34.5* 33.2*  MCV 86.3 86.2  PLT 346 366   Cardiac Enzymes: No results found for this basename: CKTOTAL, CKMB, CKMBINDEX, TROPONINI,  in the last 168 hours BNP (last 3 results) No results found for this basename: PROBNP,  in the last 8760 hours CBG: No results found for this basename: GLUCAP,  in the last 168 hours  No results found for this or any previous visit (from the past 240 hour(s)).   Studies: Dg Chest 2 View  09/08/2013   CLINICAL DATA:  Shortness of breath, cough.  EXAM: CHEST  2 VIEW  COMPARISON:  August 20, 2013.  FINDINGS: The heart size and mediastinal contours are within normal limits. No pleural effusion or pneumothorax is noted. Both lungs are clear. The visualized skeletal structures are unremarkable.  IMPRESSION: No active cardiopulmonary disease.   Electronically Signed   By: Roque Lias M.D.   On: 09/08/2013 17:05   US Venous Img Lower Unilateral Left  09/08/2013   CLINICAL DATA:  Left calf discomfort.  EXAM: Left LOWER EXTREMITY VENOUS DOPPLER ULTRASOUND  TECHNIQUE: Gray-scale sonography with graded compression, as well as color  Doppler and duplex ultrasound, were performed to evaluate the deep venous system from the level of the common femoral vein through the popliteal and proximal calf veins. Spectral Doppler was utilized to evaluate flow at rest and with distal augmentation maneuvers.  COMPARISON:  None.  FINDINGS: Thrombus within deep veins: There is thrombus within the popliteal vein, posterior tibial vein, peroneal vein, and gastrocnemius vein. Evaluation of the anterior tibial vein is limited. There is no evidence of thrombus within the common femoral or superficial or deep chondral femoral veins. The greater saphenous vein appears patent.  Other findings:  None visualized.  IMPRESSION: There is deep venous thrombosis involving the left popliteal veins and the veins of the upper left  calf.   Electronically Signed   By: David  [place]   On: 09/08/2013 17:23    Scheduled Meds: . albuterol  2.5 mg Nebulization TID  . azithromycin  500 mg Intravenous Q24H  . enoxaparin (LOVENOX) injection  1 mg/kg Subcutaneous Q12H  . gabapentin  300 mg Oral TID  . guaiFENesin  1,200 mg Oral BID  . ipratropium  0.5 mg Nebulization TID  . pantoprazole  40 mg Oral Daily  . phenytoin  300 mg Oral Daily  . predniSONE  50 mg Oral Q breakfast  . sodium chloride  3 mL Intravenous Q12H  . sulfaSALAzine  1,000 mg Oral BID  . warfarin  5 mg Oral ONCE-1800  . [START ON 09/10/2013] Warfarin - Pharmacist Dosing Inpatient   Does not apply Q24H   Continuous Infusions: . 0.9 % NaCl with KCl 20 mEq / L 150 mL/hr at 09/08/13 2345    Active Problems:   Asthma exacerbation   Polysubstance abuse   Narcotic abuse   Hypoxia   DVT of leg (deep venous thrombosis)   Anemia  Time spent:  CHIU, STEPHEN K  [facility] Pager [phone]. If 7PM-7AM, please contact night-coverage at [email or web address], password [facility] 09/09/2013, 12:33 PM  LOS: 1 day

## 2013-09-10 ENCOUNTER — Inpatient Hospital Stay (HOSPITAL_COMMUNITY): Payer: Self-pay

## 2013-09-10 DIAGNOSIS — D649 Anemia, unspecified: Secondary | ICD-10-CM

## 2013-09-10 DIAGNOSIS — J9600 Acute respiratory failure, unspecified whether with hypoxia or hypercapnia: Secondary | ICD-10-CM

## 2013-09-10 LAB — PROTIME-INR: Prothrombin Time: 28.7 seconds — ABNORMAL HIGH (ref 11.6–15.2)

## 2013-09-10 MED ORDER — TUBERCULIN PPD 5 UNIT/0.1ML ID SOLN
5.0000 [IU] | Freq: Once | INTRADERMAL | Status: AC
  Administered 2013-09-10: 5 [IU] via INTRADERMAL
  Filled 2013-09-10: qty 0.1

## 2013-09-10 MED ORDER — WARFARIN SODIUM 1 MG PO TABS
0.5000 mg | ORAL_TABLET | Freq: Once | ORAL | Status: AC
  Administered 2013-09-10: 0.5 mg via ORAL
  Filled 2013-09-10: qty 1

## 2013-09-10 MED ORDER — AZITHROMYCIN 250 MG PO TABS
500.0000 mg | ORAL_TABLET | Freq: Every day | ORAL | Status: DC
  Administered 2013-09-10: 500 mg via ORAL
  Filled 2013-09-10: qty 2

## 2013-09-10 MED ORDER — IOHEXOL 350 MG/ML SOLN
100.0000 mL | Freq: Once | INTRAVENOUS | Status: AC | PRN
  Administered 2013-09-10: 100 mL via INTRAVENOUS

## 2013-09-10 NOTE — Progress Notes (Signed)
[facility] PROGRESS NOTE  Melanie Mendoza NFA:[id number] DOB: 05-08-1971 DOA: 09/08/2013 PCP: No primary provider on file.  Assessment/Plan: 1. DVT 1. On therapeutic anticoagulation 2. No signs of bleeding 3. INR now therapeutic, but has risen sharply overnight (suspect due to concurrent antibiotics) 2. Asthma exacerbation 1. Cont scheduled nebs and steroids 2. Wean O2 as tolerated 3. On empiric azithro secondary to wheezing 3. Polysubstance abuse 1. Pt was recently admitted at [facility] s/p drug overdose 2. Avoid abusive drugs 3. Analgesics with NSAIDs as tolerated.  Code Status: Full Family Communication: Pt in room (indicate person spoken with, relationship, and if by phone, the number) Disposition Plan: Pending   HPI/Subjective: Complains of LE pain.  Objective: Filed Vitals:   09/10/13 0438 09/10/13 0500 09/10/13 0631 09/10/13 0650  BP:   136/70   Pulse:   97   Temp:   97.7 F (36.5 C)   TempSrc:   Oral   Resp:   20   Height:      Weight:  85.866 kg (189 lb 4.8 oz)    SpO2: 94%  96% 96%    Intake/Output Summary (Last 24 hours) at 09/10/13 1302 Last data filed at 09/10/13 0900  Gross per 24 hour  Intake   2775 ml  Output      0 ml  Net   2775 ml   Filed Weights   09/08/13 1618 09/08/13 2150 09/10/13 0500  Weight: 79.379 kg (175 lb) 79.9 kg (176 lb 2.4 oz) 85.866 kg (189 lb 4.8 oz)    Exam:   General:  Awake, in nad  Cardiovascular: regular, s1, s2  Respiratory: wheezing, decreased BS  Abdomen: soft, nondistended  Musculoskeletal: perfused, no clubbing   Data Reviewed: Basic Metabolic Panel:  Recent Labs Lab 09/08/13 1825 09/09/13 0600  NA 136 139  K 3.8 4.0  CL 97 101  CO2 31 28  GLUCOSE 200* 192*  BUN 7 5*  CREATININE 0.65 0.52  CALCIUM 8.7 9.1   Liver Function Tests:  Recent Labs Lab 09/08/13 1825 09/09/13 0600  AST 15 14  ALT 12 12  ALKPHOS 63 61  BILITOT 0.2* 0.2*  PROT 6.7 7.1  ALBUMIN 3.0* 2.9*   No results found  for this basename: LIPASE, AMYLASE,  in the last 168 hours No results found for this basename: AMMONIA,  in the last 168 hours CBC:  Recent Labs Lab 09/08/13 1825 09/09/13 0600  WBC 13.8* 11.5*  NEUTROABS 10.2*  --   HGB 10.9* 10.5*  HCT 34.5* 33.2*  MCV 86.3 86.2  PLT 346 366   Cardiac Enzymes: No results found for this basename: CKTOTAL, CKMB, CKMBINDEX, TROPONINI,  in the last 168 hours BNP (last 3 results) No results found for this basename: PROBNP,  in the last 8760 hours CBG: No results found for this basename: GLUCAP,  in the last 168 hours  No results found for this or any previous visit (from the past 240 hour(s)).   Studies: Dg Chest 2 View  09/08/2013   CLINICAL DATA:  Shortness of breath, cough.  EXAM: CHEST  2 VIEW  COMPARISON:  August 20, 2013.  FINDINGS: The heart size and mediastinal contours are within normal limits. No pleural effusion or pneumothorax is noted. Both lungs are clear. The visualized skeletal structures are unremarkable.  IMPRESSION: No active cardiopulmonary disease.   Electronically Signed   By: Roque Lias M.D.   On: 09/08/2013 17:05   US Venous Img Lower Unilateral Left  09/08/2013  CLINICAL DATA:  Left calf discomfort.  EXAM: Left LOWER EXTREMITY VENOUS DOPPLER ULTRASOUND  TECHNIQUE: Gray-scale sonography with graded compression, as well as color Doppler and duplex ultrasound, were performed to evaluate the deep venous system from the level of the common femoral vein through the popliteal and proximal calf veins. Spectral Doppler was utilized to evaluate flow at rest and with distal augmentation maneuvers.  COMPARISON:  None.  FINDINGS: Thrombus within deep veins: There is thrombus within the popliteal vein, posterior tibial vein, peroneal vein, and gastrocnemius vein. Evaluation of the anterior tibial vein is limited. There is no evidence of thrombus within the common femoral or superficial or deep chondral femoral veins. The greater saphenous  vein appears patent.  Other findings:  None visualized.  IMPRESSION: There is deep venous thrombosis involving the left popliteal veins and the veins of the upper left calf.   Electronically Signed   By: David  [place]   On: 09/08/2013 17:23    Scheduled Meds: . ipratropium  0.5 mg Nebulization Q4H   And  . albuterol  2.5 mg Nebulization Q4H  . azithromycin  500 mg Oral QHS  . enoxaparin (LOVENOX) injection  1 mg/kg Subcutaneous Q12H  . gabapentin  300 mg Oral TID  . guaiFENesin  1,200 mg Oral BID  . methylPREDNISolone (SOLU-MEDROL) injection  60 mg Intravenous Q6H  . pantoprazole  40 mg Oral Daily  . phenytoin  300 mg Oral Daily  . sodium chloride  3 mL Intravenous Q12H  . sulfaSALAzine  1,000 mg Oral BID  . warfarin  0.5 mg Oral ONCE-1800  . Warfarin - Pharmacist Dosing Inpatient   Does not apply Q24H   Continuous Infusions: . 0.9 % NaCl with KCl 20 mEq / L 150 mL/hr at 09/10/13 6295    Active Problems:   Asthma exacerbation   Polysubstance abuse   Narcotic abuse   Hypoxia   DVT of leg (deep venous thrombosis)   Anemia  Time spent:  CHIU, STEPHEN K  [facility] Pager [phone]. If 7PM-7AM, please contact night-coverage at [email or web address], password [facility] 09/10/2013, 1:02 PM  LOS: 2 days

## 2013-09-10 NOTE — Clinical Social Work Note (Signed)
Referral faxed to [facility] at patient request.  Director will review Monday.  Patient also requested information on [facility] in [place]. CSW will attempt to find contact information for patient.  Santa Genera, LCSW Clinical Social Worker [phone])

## 2013-09-10 NOTE — Care Management Note (Addendum)
    Page 1 of 1   09/13/2013     12:07:38 PM   CARE MANAGEMENT NOTE 09/13/2013  Patient:  Melanie Mendoza, Melanie Mendoza   Account Number:  [id number]  Date Initiated:  09/10/2013  Documentation initiated by:  Rosemary Holms  Subjective/Objective Assessment:   Pt admitted withAsthma/Bronchitis. No PCP. Per pt, used to be a pt at [facility] but owes money and not sure if they will agree to see her. 2nd choice would be the [facility]     Action/Plan:   Called bot [facility] and [facility]. Both offices are closed this Friday after Thanksgiving.   Anticipated DC Date:  09/12/2013   Anticipated DC Plan:  HOME/SELF CARE      DC Planning Services  CM consult      Choice offered to / List presented to:             Status of service:  Completed, signed off Medicare Important Message given?   (If response is "NO", the following Medicare IM given date fields will be blank) Date Medicare IM given:   Date Additional Medicare IM given:    Discharge Disposition:  HOME/SELF CARE  Per UR Regulation:    If discussed at Long Length of Stay Meetings, dates discussed:    Comments:  09/13/13 Rosemary Holms RN BSN CM Pt set up at the [facility] for financial appt, blood work and MD for this Thursday. Pt aware and knows to bring documentation given to her on the handout. O2 to be supplied by [facility]. Emma with [facility] coming to speak with pt regarding O2 arrangements. Pt going to friends home at [place]: [facility], [address], [place], [place] [address] [phone] is contact number for friend.  09/10/13 Rosemary Holms RN BSN CM Approved through Dr.Chiu to give pt a list of PCP's to contact ASAP. Express the importance of following up with this so her blood thinner can be monitored.

## 2013-09-10 NOTE — Clinical Social Work Psychosocial (Signed)
    Clinical Social Work Department BRIEF PSYCHOSOCIAL ASSESSMENT 09/10/2013  Patient:  Melanie Mendoza, Melanie Mendoza     Account Number:  [id number]     Admit date:  09/08/2013  Clinical Social Worker:  Santa Genera, CLINICAL SOCIAL WORKER  Date/Time:  09/10/2013 04:00 PM  Referred by:  Physician  Date Referred:  09/10/2013 Referred for  Substance Abuse   Other Referral:   Interview type:  Patient Other interview type:    PSYCHOSOCIAL DATA Living Status:  FAMILY Admitted from facility:   Level of care:   Primary support name:  Amy Isley Primary support relationship to patient:  SIBLING Degree of support available:   Has family support    CURRENT CONCERNS Current Concerns  Substance Abuse   Other Concerns:    SOCIAL WORK ASSESSMENT / PLAN CSW met w patient at bedside, patient alert and oriented x4.  States she wants help getting off opiates and benzos. Says she "boosts", was unable to state how much her daily use cost.  Says she uses 30 mg suboxone tablets, methadone, percoset, "Lots" of Xanax.  Has been to prisonfor habitual larceny due to multiple misdemeanors related to drug issues.    Patient says she was at a 16 month treatrment facility in [place] and was "doing great" - attending AA and NA, had a sponsor and a job.  As soon as she came back to [place], she reconnected w her previous drug use habits and quickly fell into past use of drugs.  Had a past history of significant alcoholism, but says she no longer drinks after being in prison.    Wants treatment, would like to go to [facility] in [place] but says "Maxine Glenn doesnt want to mess w me."  CSW called [facility], Maxine Glenn is on vacation, completed referral to [facility] w patient.  Requested Tb skin test in preparation for possible admission - [facility] has female beds per staff but no admissions until [facility] returns from vacation on Monday.   Assessment/plan status:  Referral to [facility] Other assessment/ plan:    Information/referral to community resources:   List of SA treatment facilities  AA and NA meetings in [place]    PATIENT'S/FAMILY'S RESPONSE TO PLAN OF CARE: Patient tearful - states she wants treatment     Santa Genera, LCSW Clinical Social Worker [phone])

## 2013-09-10 NOTE — Progress Notes (Signed)
ANTICOAGULATION CONSULT NOTE  Pharmacy Consult for Lovenox--> Coumadin Indication: DVT  No Known Allergies  Patient Measurements: Height: 5\' 7"  (170.2 cm) Weight: 189 lb 4.8 oz (85.866 kg) (done with stand up scale.) IBW/kg (Calculated) : 61.6  Vital Signs: Temp: 97.7 F (36.5 C) (11/28 0631) Temp src: Oral (11/28 0631) BP: 136/70 mmHg (11/28 0631) Pulse Rate: 97 (11/28 0631)  Labs:  Recent Labs  09/08/13 1825 09/09/13 0600 09/10/13 0618  HGB 10.9* 10.5*  --   HCT 34.5* 33.2*  --   PLT 346 366  --   APTT 36  --   --   LABPROT 14.9 16.3* 28.7*  INR 1.20 1.34 2.82*  CREATININE 0.65 0.52  --     Estimated Creatinine Clearance: 102.1 ml/min (by C-G formula based on Cr of 0.52).   Medications:  Scheduled:  . ipratropium  0.5 mg Nebulization Q4H   And  . albuterol  2.5 mg Nebulization Q4H  . azithromycin  500 mg Intravenous Q24H  . enoxaparin (LOVENOX) injection  1 mg/kg Subcutaneous Q12H  . gabapentin  300 mg Oral TID  . guaiFENesin  1,200 mg Oral BID  . methylPREDNISolone (SOLU-MEDROL) injection  60 mg Intravenous Q6H  . pantoprazole  40 mg Oral Daily  . phenytoin  300 mg Oral Daily  . sodium chloride  3 mL Intravenous Q12H  . sulfaSALAzine  1,000 mg Oral BID  . Warfarin - Pharmacist Dosing Inpatient   Does not apply Q24H    Assessment: 42 yo F with new LLE DVT.  She is currently on overlap day#3/5 of Lovenox-->warfarin.   Initial dose of warfarin 10mg  given by MD on day 1.  Patient is on phenytoin, sulfasalazine, and Azithromycin which are all potentiators of warfarin so would expect decreased warfarin requirement for this patient.  This is evidenced by INR already within goal range.  No bleeding noted.   Goal of Therapy:  INR 2-3 Monitor platelets by anticoagulation protocol: Yes   Plan:  Lovenox 80mg  sq q12h for total 5 days. Coumadin 0.5mg  po x1 today Monitor INR daily CBC on MWF  Coumadin education provided to patient.  Change Zithromax to po per  P&T policy  Elson Clan 09/10/2013,8:06 AM

## 2013-09-10 NOTE — Progress Notes (Signed)
Ambulated patient per MD request. Continuous oxygen monitoring was performed. Patients sats were 86-90% on RA. HR increased up to the 140's. Patient was SOB and coughed often while trying to catch her breath. MD notified.

## 2013-09-11 DIAGNOSIS — I2699 Other pulmonary embolism without acute cor pulmonale: Principal | ICD-10-CM

## 2013-09-11 LAB — CBC
HCT: 30.2 % — ABNORMAL LOW (ref 36.0–46.0)
Hemoglobin: 9.6 g/dL — ABNORMAL LOW (ref 12.0–15.0)
MCH: 27.5 pg (ref 26.0–34.0)
MCHC: 31.8 g/dL (ref 30.0–36.0)
Platelets: 409 10*3/uL — ABNORMAL HIGH (ref 150–400)
RDW: 13.9 % (ref 11.5–15.5)

## 2013-09-11 LAB — PROTIME-INR: Prothrombin Time: 28.1 seconds — ABNORMAL HIGH (ref 11.6–15.2)

## 2013-09-11 MED ORDER — IPRATROPIUM BROMIDE 0.02 % IN SOLN
0.5000 mg | RESPIRATORY_TRACT | Status: DC | PRN
  Administered 2013-09-12: 0.5 mg via RESPIRATORY_TRACT
  Filled 2013-09-11: qty 2.5

## 2013-09-11 MED ORDER — WARFARIN SODIUM 2 MG PO TABS: 2.0000 mg | ORAL_TABLET | Freq: Once | ORAL | Status: DC

## 2013-09-11 MED ORDER — LORAZEPAM 2 MG/ML IJ SOLN
1.0000 mg | Freq: Once | INTRAMUSCULAR | Status: AC
  Administered 2013-09-11: 1 mg via INTRAVENOUS
  Filled 2013-09-11: qty 1

## 2013-09-11 MED ORDER — WARFARIN SODIUM 1 MG PO TABS
1.0000 mg | ORAL_TABLET | Freq: Once | ORAL | Status: AC
  Administered 2013-09-11: 1 mg via ORAL
  Filled 2013-09-11: qty 1

## 2013-09-11 MED ORDER — ALBUTEROL SULFATE (5 MG/ML) 0.5% IN NEBU
2.5000 mg | INHALATION_SOLUTION | RESPIRATORY_TRACT | Status: DC | PRN
  Administered 2013-09-12: 2.5 mg via RESPIRATORY_TRACT
  Filled 2013-09-11: qty 0.5

## 2013-09-11 NOTE — Progress Notes (Signed)
[facility] PROGRESS NOTE  Melanie Mendoza ZOX:[id number] DOB: 09/20/1971 DOA: 09/08/2013 PCP: No primary provider on file.  Assessment/Plan: 1. DVT/PE 1. On therapeutic anticoagulation 2. No signs of bleeding 3. INR now therapeutic, stable 4. Hypoxic on ambulation with CT + for PE 5. Will benefit from home O2. Will discuss with case manager. Home O2 order placed. 2. Asthma exacerbation 1. No more wheezing 2. Resolved 3. Consider stopping steroids and changing nebs to PRN only 3. Polysubstance abuse 1. Pt was recently admitted at [facility] s/p drug overdose 2. Avoid abusive drugs 3. Analgesics with NSAIDs as tolerated.  Code Status: Full Family Communication: Pt in room (indicate person spoken with, relationship, and if by phone, the number) Disposition Plan: Pending   HPI/Subjective: Complains of LE pain.  Objective: Filed Vitals:   09/10/13 2109 09/11/13 0037 09/11/13 0536 09/11/13 0726  BP: 167/79  158/74   Pulse: 110  104   Temp: 98.6 F (37 C)  98.6 F (37 C)   TempSrc: Oral  Oral   Resp: 20  20   Height:      Weight:      SpO2: 96% 98% 96% 96%    Intake/Output Summary (Last 24 hours) at 09/11/13 1153 Last data filed at 09/10/13 1800  Gross per 24 hour  Intake 3772.5 ml  Output      0 ml  Net 3772.5 ml   Filed Weights   09/08/13 1618 09/08/13 2150 09/10/13 0500  Weight: 79.379 kg (175 lb) 79.9 kg (176 lb 2.4 oz) 85.866 kg (189 lb 4.8 oz)    Exam:   General:  Awake, in nad  Cardiovascular: regular, s1, s2  Respiratory: clear, no wheezing  Abdomen: soft, nondistended  Musculoskeletal: perfused, no clubbing   Data Reviewed: Basic Metabolic Panel:  Recent Labs Lab 09/08/13 1825 09/09/13 0600  NA 136 139  K 3.8 4.0  CL 97 101  CO2 31 28  GLUCOSE 200* 192*  BUN 7 5*  CREATININE 0.65 0.52  CALCIUM 8.7 9.1   Liver Function Tests:  Recent Labs Lab 09/08/13 1825 09/09/13 0600  AST 15 14  ALT 12 12  ALKPHOS 63 61  BILITOT 0.2* 0.2*   PROT 6.7 7.1  ALBUMIN 3.0* 2.9*   No results found for this basename: LIPASE, AMYLASE,  in the last 168 hours No results found for this basename: AMMONIA,  in the last 168 hours CBC:  Recent Labs Lab 09/08/13 1825 09/09/13 0600 09/11/13 0613  WBC 13.8* 11.5* 23.1*  NEUTROABS 10.2*  --   --   HGB 10.9* 10.5* 9.6*  HCT 34.5* 33.2* 30.2*  MCV 86.3 86.2 86.5  PLT 346 366 409*   Cardiac Enzymes: No results found for this basename: CKTOTAL, CKMB, CKMBINDEX, TROPONINI,  in the last 168 hours BNP (last 3 results) No results found for this basename: PROBNP,  in the last 8760 hours CBG: No results found for this basename: GLUCAP,  in the last 168 hours  No results found for this or any previous visit (from the past 240 hour(s)).   Studies: Ct Angio Chest Pe W/cm &/or Wo Cm  09/10/2013   CLINICAL DATA:  Chest pain and shortness of breath. History of a DVT.  EXAM: CT ANGIOGRAPHY CHEST WITH CONTRAST  TECHNIQUE: Multidetector CT imaging of the chest was performed using the standard protocol during bolus administration of intravenous contrast. Multiplanar CT image reconstructions including MIPs were obtained to evaluate the vascular anatomy.  CONTRAST:  OMNIPAQUE IOHEXOL 350  MG/ML SOLN  COMPARISON:  Chest radiograph, 09/08/2013  FINDINGS: There are multiple segmental pulmonary emboli. On the right, there are segmental pulmonary emboli to the anterior and apical upper lobe segments. There are smaller segmental middle lobe emboli and there are segmental emboli to the right lower lobe, most prominent to the posterior-basilar segment. Less pulmonary emboli are noted on the left. There are segmental lower lobe branch emboli. No other convincing emboli. Overall embolus burden is moderate.  The right ventricular to left ventricular ratio is 0.973 which can be indicative of right heart strain.  The heart is normal in size. The great vessels are normal in caliber. There is shotty mild mediastinal  adenopathy. A 13 mm short axis subcarinal node is noted. There is an 11 mm short axis right peritracheal node. These are likely reactive. No mediastinal masses. No hilar masses or enlarged lymph nodes. There arm prominent hilar lymph nodes, however.  There are no areas of lung consolidation to suggest infarction. Subsegmental atelectasis is noted most evident in the left lower lobe. Reticular opacities are noted elsewhere, most prominent in the anteromedial right upper lobe and right middle lobe, also likely subsegmental atelectasis.  No pleural effusion.  No pneumothorax.  Limited evaluation of the upper abdomen is unremarkable.  Minimal degenerative changes are noted of the thoracic spine.  Review of the MIP images confirms the above findings.  IMPRESSION: 1. There are bilateral pulmonary emboli affecting segmental and smaller branches, more notable on the right. Overall clot burden is moderate. 2. RV/LV ratio is mildly increased, which can be indicative of right heart strain and sub massive pulmonary embolus. Clinical correlation is advised. 3. Lungs show areas of subsegmental atelectasis, but no evidence of infarction. No pleural effusion.   Electronically Signed   By: Amie Portland M.D.   On: 09/10/2013 16:38    Scheduled Meds: . ipratropium  0.5 mg Nebulization Q4H   And  . albuterol  2.5 mg Nebulization Q4H  . azithromycin  500 mg Oral QHS  . gabapentin  300 mg Oral TID  . guaiFENesin  1,200 mg Oral BID  . methylPREDNISolone (SOLU-MEDROL) injection  60 mg Intravenous Q6H  . pantoprazole  40 mg Oral Daily  . phenytoin  300 mg Oral Daily  . sodium chloride  3 mL Intravenous Q12H  . sulfaSALAzine  1,000 mg Oral BID  . tuberculin  5 Units Intradermal Once  . warfarin  1 mg Oral Once  . Warfarin - Pharmacist Dosing Inpatient   Does not apply Q24H   Continuous Infusions:    Active Problems:   Asthma exacerbation   Polysubstance abuse   Narcotic abuse   Hypoxia   DVT of leg (deep venous  thrombosis)   Anemia  Time spent:  CHIU, STEPHEN K  [facility] Pager [phone]. If 7PM-7AM, please contact night-coverage at [email or web address], password [facility] 09/11/2013, 11:53 AM  LOS: 3 days

## 2013-09-11 NOTE — Progress Notes (Signed)
ANTICOAGULATION CONSULT NOTE  Pharmacy Consult for Lovenox--> Coumadin Indication: DVT  No Known Allergies  Patient Measurements: Height: 5\' 7"  (170.2 cm) Weight: 189 lb 4.8 oz (85.866 kg) (done with stand up scale.) IBW/kg (Calculated) : 61.6  Vital Signs: Temp: 98.6 F (37 C) (11/29 0536) Temp src: Oral (11/29 0536) BP: 158/74 mmHg (11/29 0536) Pulse Rate: 104 (11/29 0536)  Labs:  Recent Labs  09/08/13 1825 09/09/13 0600 09/10/13 0618 09/11/13 0613  HGB 10.9* 10.5*  --  9.6*  HCT 34.5* 33.2*  --  30.2*  PLT 346 366  --  409*  APTT 36  --   --   --   LABPROT 14.9 16.3* 28.7* 28.1*  INR 1.20 1.34 2.82* 2.74*  CREATININE 0.65 0.52  --   --     Estimated Creatinine Clearance: 102.1 ml/min (by C-G formula based on Cr of 0.52).   Medications:  Scheduled:  . ipratropium  0.5 mg Nebulization Q4H   And  . albuterol  2.5 mg Nebulization Q4H  . azithromycin  500 mg Oral QHS  . gabapentin  300 mg Oral TID  . guaiFENesin  1,200 mg Oral BID  . methylPREDNISolone (SOLU-MEDROL) injection  60 mg Intravenous Q6H  . pantoprazole  40 mg Oral Daily  . phenytoin  300 mg Oral Daily  . sodium chloride  3 mL Intravenous Q12H  . sulfaSALAzine  1,000 mg Oral BID  . tuberculin  5 Units Intradermal Once  . warfarin  2 mg Oral Once  . Warfarin - Pharmacist Dosing Inpatient   Does not apply Q24H    Assessment: 42 yo F with new LLE DVT.  She is currently on overlap day#3/5 of Lovenox-->warfarin.   Initial dose of warfarin 10mg  given by MD on day 1.  Patient is on phenytoin, sulfasalazine, and Azithromycin which are all potentiators of warfarin so would expect decreased warfarin requirement for this patient.  This is evidenced by INR already within goal range.  No bleeding noted.  INR therapeutic, has decreased from 2.82 yesterday to 2.74 today Lovenox discontinued by MD  Goal of Therapy:  INR 2-3 Monitor platelets by anticoagulation protocol: Yes   Plan:  Coumadin 2 mg po x1  today Monitor INR daily CBC on MWF  Coumadin education provided to patient.  Change Zithromax to po per P&T policy  Raquel James, Koah Chisenhall Bennett 09/11/2013,8:58 AM

## 2013-09-12 LAB — PROTIME-INR
INR: 2.27 — ABNORMAL HIGH (ref 0.00–1.49)
Prothrombin Time: 24.3 seconds — ABNORMAL HIGH (ref 11.6–15.2)

## 2013-09-12 MED ORDER — PREDNISONE 20 MG PO TABS
60.0000 mg | ORAL_TABLET | Freq: Every day | ORAL | Status: DC
  Administered 2013-09-12 – 2013-09-13 (×2): 60 mg via ORAL
  Filled 2013-09-12 (×2): qty 3

## 2013-09-12 MED ORDER — WARFARIN SODIUM 2.5 MG PO TABS: 2.5000 mg | ORAL_TABLET | Freq: Once | ORAL | Status: DC

## 2013-09-12 MED ORDER — WARFARIN SODIUM 2.5 MG PO TABS
2.5000 mg | ORAL_TABLET | Freq: Once | ORAL | Status: AC
  Administered 2013-09-12: 2.5 mg via ORAL
  Filled 2013-09-12: qty 1

## 2013-09-12 MED ORDER — QUETIAPINE FUMARATE 25 MG PO TABS
25.0000 mg | ORAL_TABLET | Freq: Every day | ORAL | Status: DC
  Administered 2013-09-12: 25 mg via ORAL
  Filled 2013-09-12: qty 1

## 2013-09-12 MED ORDER — TRAZODONE HCL 50 MG PO TABS
150.0000 mg | ORAL_TABLET | Freq: Every evening | ORAL | Status: DC | PRN
  Administered 2013-09-12: 150 mg via ORAL
  Filled 2013-09-12: qty 3

## 2013-09-12 NOTE — Progress Notes (Signed)
Called by RN earlier this evening. Pt's cousin presents to room apparently angry with the current course of care and demands to speak with the MD. I presented to the room along with the covering floor nurse. Family member is upset, believing patient is being offered suboptimal care because of lack of insurance. Family re-assured that same care is given to pt that is given to ALL patients admitted, regardless of insurance status.   I then asked patient to update her relative regarding her course of care. Pt gave an accurate description or her course while I, and the RN, was present. Family said multiple times, "Oh, you didn't tell me that. I'm only going by what you were telling me." Pt's other complaint was regarding pain medications, claiming pain meds were intentionally being withheld. With the RN in the room, pt admits that she did not ask for any pain medications all day today. Family responds, "Oh, you didn't tell me that." Pt educated to ask RN for PRN pain meds in the future. Of note, RN reports pt has been ambulating in the hallway today multiple times today without difficulty and not appearing in any pain. Family later apologized.

## 2013-09-12 NOTE — Progress Notes (Signed)
TB skin test assessed after 48 hours of receiving skin test to left forearm. No indentation noted.

## 2013-09-12 NOTE — Progress Notes (Signed)
ANTICOAGULATION CONSULT NOTE  Pharmacy Consult for Lovenox--> Coumadin Indication: DVT  No Known Allergies  Patient Measurements: Height: 5\' 7"  (170.2 cm) Weight: 189 lb 4.8 oz (85.866 kg) (done with stand up scale.) IBW/kg (Calculated) : 61.6  Vital Signs: Temp: 97.8 F (36.6 C) (11/30 0500) Temp src: Oral (11/30 0500) BP: 119/76 mmHg (11/30 0500) Pulse Rate: 76 (11/30 0500)  Labs:  Recent Labs  09/10/13 0618 09/11/13 0613 09/12/13 0534  HGB  --  9.6*  --   HCT  --  30.2*  --   PLT  --  409*  --   LABPROT 28.7* 28.1* 24.3*  INR 2.82* 2.74* 2.27*    Estimated Creatinine Clearance: 102.1 ml/min (by C-G formula based on Cr of 0.52).   Medications:  Scheduled:  . gabapentin  300 mg Oral TID  . guaiFENesin  1,200 mg Oral BID  . pantoprazole  40 mg Oral Daily  . phenytoin  300 mg Oral Daily  . sodium chloride  3 mL Intravenous Q12H  . sulfaSALAzine  1,000 mg Oral BID  . tuberculin  5 Units Intradermal Once  . warfarin  2.5 mg Oral ONCE-1800  . Warfarin - Pharmacist Dosing Inpatient   Does not apply Q24H    Assessment: 42 yo F with new LLE DVT.  She is currently on overlap day#3/5 of Lovenox-->warfarin.   Initial dose of warfarin 10mg  given by MD on day 1.  Patient is on phenytoin, sulfasalazine, and Azithromycin which are all potentiators of warfarin so would expect decreased warfarin requirement for this patient.  This is evidenced by INR already within goal range.  No bleeding noted.  INR therapeutic   Goal of Therapy:  INR 2-3 Monitor platelets by anticoagulation protocol: Yes   Plan:  Coumadin 2.5  mg po x1 today Monitor INR daily CBC on MWF    Danai Gotto Bennett 09/12/2013,10:16 AM

## 2013-09-12 NOTE — Progress Notes (Signed)
Client expressed concerns about pain medicine and not being able to sleep. Patient states that her leg was hurting her, and that she needed to sleep and usually takes xanax to help her rest. Notified Dr. Orvan Falconer of patients concerns.  Dr.  Orvan Falconer came to floor and talked to patient.  Explained to patient that she would not receive any narcotic medicine because of her desire for rehab. Also, he did also order patient something to help her rest.  Patient was comfortable talking to  Dr. Orvan Falconer, and seemed to be appeased by his explanations.

## 2013-09-12 NOTE — Progress Notes (Signed)
[facility] PROGRESS NOTE  Melanie Mendoza ZOX:[id number] DOB: [birth year] DOA: 09/08/2013 PCP: No primary provider on file.  Assessment/Plan: 1. DVT/PE 1. On therapeutic anticoagulation 2. No signs of bleeding 3. INR therapeutic, stable 4. Hypoxic (sats below 88%) on ambulation with CT + for PE 5. Will benefit from home O2. Will discuss with case manager. Home O2 order placed. 2. Asthma exacerbation 1. No more wheezing 2. Resolved 3. Stopped steroids and changing nebs to PRN only 3. Polysubstance abuse 1. Pt was recently admitted at [facility] s/p drug overdose 2. Avoid abusive drugs 3. Analgesics with NSAIDs as tolerated.  Code Status: Full Family Communication: Pt in room (indicate person spoken with, relationship, and if by phone, the number) Disposition Plan: Pending  HPI/Subjective: Complains of LE pain.  Objective: Filed Vitals:   09/11/13 2100 09/11/13 2154 09/12/13 0407 09/12/13 0500  BP: 138/77   119/76  Pulse: 88 82  76  Temp: 97.8 F (36.6 C)   97.8 F (36.6 C)  TempSrc: Oral   Oral  Resp: 20 20  18   Height:      Weight:      SpO2: 94% 96% 94% 99%    Intake/Output Summary (Last 24 hours) at 09/12/13 1025 Last data filed at 09/11/13 1441  Gross per 24 hour  Intake    240 ml  Output      0 ml  Net    240 ml   Filed Weights   09/08/13 1618 09/08/13 2150 09/10/13 0500  Weight: 79.379 kg (175 lb) 79.9 kg (176 lb 2.4 oz) 85.866 kg (189 lb 4.8 oz)    Exam:   General:  Awake, in nad  Cardiovascular: regular, s1, s2  Respiratory: clear, no wheezing  Abdomen: soft, nondistended  Musculoskeletal: perfused, no clubbing   Data Reviewed: Basic Metabolic Panel:  Recent Labs Lab 09/08/13 1825 09/09/13 0600  NA 136 139  K 3.8 4.0  CL 97 101  CO2 31 28  GLUCOSE 200* 192*  BUN 7 5*  CREATININE 0.65 0.52  CALCIUM 8.7 9.1   Liver Function Tests:  Recent Labs Lab 09/08/13 1825 09/09/13 0600  AST 15 14  ALT 12 12  ALKPHOS 63 61  BILITOT  0.2* 0.2*  PROT 6.7 7.1  ALBUMIN 3.0* 2.9*   No results found for this basename: LIPASE, AMYLASE,  in the last 168 hours No results found for this basename: AMMONIA,  in the last 168 hours CBC:  Recent Labs Lab 09/08/13 1825 09/09/13 0600 09/11/13 0613  WBC 13.8* 11.5* 23.1*  NEUTROABS 10.2*  --   --   HGB 10.9* 10.5* 9.6*  HCT 34.5* 33.2* 30.2*  MCV 86.3 86.2 86.5  PLT 346 366 409*   Cardiac Enzymes: No results found for this basename: CKTOTAL, CKMB, CKMBINDEX, TROPONINI,  in the last 168 hours BNP (last 3 results) No results found for this basename: PROBNP,  in the last 8760 hours CBG: No results found for this basename: GLUCAP,  in the last 168 hours  No results found for this or any previous visit (from the past 240 hour(s)).   Studies: Ct Angio Chest Pe W/cm &/or Wo Cm  09/10/2013   CLINICAL DATA:  Chest pain and shortness of breath. History of a DVT.  EXAM: CT ANGIOGRAPHY CHEST WITH CONTRAST  TECHNIQUE: Multidetector CT imaging of the chest was performed using the standard protocol during bolus administration of intravenous contrast. Multiplanar CT image reconstructions including MIPs were obtained to evaluate the vascular anatomy.  CONTRAST:  OMNIPAQUE IOHEXOL 350 MG/ML SOLN  COMPARISON:  Chest radiograph, 09/08/2013  FINDINGS: There are multiple segmental pulmonary emboli. On the right, there are segmental pulmonary emboli to the anterior and apical upper lobe segments. There are smaller segmental middle lobe emboli and there are segmental emboli to the right lower lobe, most prominent to the posterior-basilar segment. Less pulmonary emboli are noted on the left. There are segmental lower lobe branch emboli. No other convincing emboli. Overall embolus burden is moderate.  The right ventricular to left ventricular ratio is 0.973 which can be indicative of right heart strain.  The heart is normal in size. The great vessels are normal in caliber. There is shotty mild  mediastinal adenopathy. A 13 mm short axis subcarinal node is noted. There is an 11 mm short axis right peritracheal node. These are likely reactive. No mediastinal masses. No hilar masses or enlarged lymph nodes. There arm prominent hilar lymph nodes, however.  There are no areas of lung consolidation to suggest infarction. Subsegmental atelectasis is noted most evident in the left lower lobe. Reticular opacities are noted elsewhere, most prominent in the anteromedial right upper lobe and right middle lobe, also likely subsegmental atelectasis.  No pleural effusion.  No pneumothorax.  Limited evaluation of the upper abdomen is unremarkable.  Minimal degenerative changes are noted of the thoracic spine.  Review of the MIP images confirms the above findings.  IMPRESSION: 1. There are bilateral pulmonary emboli affecting segmental and smaller branches, more notable on the right. Overall clot burden is moderate. 2. RV/LV ratio is mildly increased, which can be indicative of right heart strain and sub massive pulmonary embolus. Clinical correlation is advised. 3. Lungs show areas of subsegmental atelectasis, but no evidence of infarction. No pleural effusion.   Electronically Signed   By: Amie Portland M.D.   On: 09/10/2013 16:38    Scheduled Meds: . gabapentin  300 mg Oral TID  . guaiFENesin  1,200 mg Oral BID  . pantoprazole  40 mg Oral Daily  . phenytoin  300 mg Oral Daily  . sodium chloride  3 mL Intravenous Q12H  . sulfaSALAzine  1,000 mg Oral BID  . tuberculin  5 Units Intradermal Once  . warfarin  2.5 mg Oral Once  . Warfarin - Pharmacist Dosing Inpatient   Does not apply Q24H   Continuous Infusions:    Active Problems:   Asthma exacerbation   Polysubstance abuse   Narcotic abuse   Hypoxia   DVT of leg (deep venous thrombosis)   Anemia  Time spent:  CHIU, STEPHEN K  [facility] Pager [phone]. If 7PM-7AM, please contact night-coverage at [email or web address], password  [facility] 09/12/2013, 10:25 AM  LOS: 4 days

## 2013-09-13 DIAGNOSIS — I2699 Other pulmonary embolism without acute cor pulmonale: Secondary | ICD-10-CM

## 2013-09-13 DIAGNOSIS — IMO0002 Reserved for concepts with insufficient information to code with codable children: Secondary | ICD-10-CM

## 2013-09-13 DIAGNOSIS — R4689 Other symptoms and signs involving appearance and behavior: Secondary | ICD-10-CM | POA: Diagnosis not present

## 2013-09-13 HISTORY — DX: Other pulmonary embolism without acute cor pulmonale: I26.99

## 2013-09-13 LAB — PROTIME-INR: Prothrombin Time: 23.7 seconds — ABNORMAL HIGH (ref 11.6–15.2)

## 2013-09-13 MED ORDER — QUETIAPINE FUMARATE 25 MG PO TABS: 25.0000 mg | ORAL_TABLET | Freq: Every evening | ORAL | Status: DC | PRN

## 2013-09-13 MED ORDER — KETOROLAC TROMETHAMINE 10 MG PO TABS: 10.0000 mg | ORAL_TABLET | Freq: Four times a day (QID) | ORAL | Status: DC | PRN

## 2013-09-13 MED ORDER — ALBUTEROL SULFATE HFA 108 (90 BASE) MCG/ACT IN AERS: 2.0000 | INHALATION_SPRAY | RESPIRATORY_TRACT | Status: DC | PRN

## 2013-09-13 MED ORDER — GABAPENTIN 300 MG PO CAPS: 300.0000 mg | ORAL_CAPSULE | Freq: Three times a day (TID) | ORAL | Status: DC

## 2013-09-13 MED ORDER — KETOROLAC TROMETHAMINE 10 MG PO TABS
10.0000 mg | ORAL_TABLET | Freq: Four times a day (QID) | ORAL | Status: DC | PRN
  Administered 2013-09-13: 10 mg via ORAL
  Filled 2013-09-13: qty 1

## 2013-09-13 MED ORDER — WARFARIN SODIUM 2.5 MG PO TABS: 2.5000 mg | ORAL_TABLET | Freq: Once | ORAL | Status: DC

## 2013-09-13 MED ORDER — PREDNISONE 20 MG PO TABS: 60.0000 mg | ORAL_TABLET | Freq: Every day | ORAL | Status: DC

## 2013-09-13 NOTE — Clinical Social Work Note (Signed)
Patient declined follow up for referrals for placement at [facility] and [facility], patient had information on both places given to her on Friday.  Says she plans to move to [place] at discharge, requested information on AA and NA meetings.  List given and patient encouraged to pursue 12 step recovery programs.  Santa Genera, LCSW Clinical Social Worker [phone])

## 2013-09-13 NOTE — Progress Notes (Signed)
Patients RA stat at rest - 95%,  Stats while ambulating on RA ranged from 91-95%.  Patient had minimal SOB after a couple minutes, but otherwise tolerated well.

## 2013-09-13 NOTE — Progress Notes (Signed)
Patient discharged home.  IV removed - WNL.  Patient instructed on meds and how to take them.  Pt voiced concern over being able to afford meds - CM involved and gave voucher for applicable meds.  Follow up in place with [facility].  No questions at this time.  Left floor in stable condition via WC with NT

## 2013-09-13 NOTE — Progress Notes (Signed)
ANTICOAGULATION CONSULT NOTE  Pharmacy Consult for Coumadin Indication: DVT  No Known Allergies  Patient Measurements: Height: 5\' 7"  (170.2 cm) Weight: 193 lb 9 oz (87.8 kg) IBW/kg (Calculated) : 61.6  Vital Signs: Temp: 98.6 F (37 C) (12/01 0509) Temp src: Oral (12/01 0509) BP: 140/94 mmHg (12/01 0509) Pulse Rate: 81 (12/01 0509)  Labs:  Recent Labs  09/11/13 0613 09/12/13 0534 09/13/13 0607  HGB 9.6*  --   --   HCT 30.2*  --   --   PLT 409*  --   --   LABPROT 28.1* 24.3* 23.7*  INR 2.74* 2.27* 2.20*   Estimated Creatinine Clearance: 103.2 ml/min (by C-G formula based on Cr of 0.52).  Medications:  Scheduled:  . gabapentin  300 mg Oral TID  . guaiFENesin  1,200 mg Oral BID  . pantoprazole  40 mg Oral Daily  . predniSONE  60 mg Oral Q breakfast  . QUEtiapine  25 mg Oral QHS  . sodium chloride  3 mL Intravenous Q12H  . sulfaSALAzine  1,000 mg Oral BID  . Warfarin - Pharmacist Dosing Inpatient   Does not apply Q24H   Assessment: 42 yo F with new LLE DVT.  Lovenox was given 11/26 > 11/28   Initial dose of warfarin 10mg  given by MD on day 1.  Patient is on phenytoin, sulfasalazine, and Azithromycin which are all potentiators of warfarin so would expect decreased warfarin requirement for this patient.  No bleeding noted. INR therapeutic  Goal of Therapy:  INR 2-3 Monitor platelets by anticoagulation protocol: Yes   Plan:  Coumadin 2.5 mg po x1 today Monitor INR daily CBC on MWF    Melanie Mendoza A 09/13/2013,10:21 AM

## 2013-09-13 NOTE — Discharge Summary (Signed)
Physician Discharge Summary  Melanie Mendoza JYN:[id number] DOB: 06/13/1971 DOA: 09/08/2013  PCP: No primary provider on file.  Admit date: 09/08/2013 Discharge date: 09/13/2013  Time spent: 35 minutes  Recommendations for Outpatient Follow-up:  1. Repeat INR in 1-2 weeks, with goal of 2-3 2. Would treat with coumadin for a total of 6-9 months 3. Please follow up hypercoagulable panel  Discharge Diagnoses:  Principal Problem:   Acute pulmonary embolism Active Problems:   Asthma exacerbation   Polysubstance abuse   Narcotic abuse   Hypoxia   DVT of leg (deep venous thrombosis)   Anemia   Manipulative behavior   Discharge Condition: Stable  Diet recommendation: Regular  Filed Weights   09/08/13 2150 09/10/13 0500 09/13/13 0500  Weight: 79.9 kg (176 lb 2.4 oz) 85.866 kg (189 lb 4.8 oz) 87.8 kg (193 lb 9 oz)    History of present illness:  Melanie Mendoza is a 42 y.o. female. History of asthma and polysubstance abuse  Admitted to the service 3 weeks ago for overdose with benzodiazepines and opiates, and signed out AGAINST MEDICAL ADVICE following day.  Now comes to the emergency room complaining of shortness of breath and pain and swelling of the left leg for 2 weeks.  In the emergency room she received initial treatment for asthma exacerbation including antibiotics and steroids, and Doppler of the left lower extremity revealed the popliteal vein DVT. She's been started on Lovenox and Coumadin  Patient's most pressing concern is pain medication; she has been advised that continued drug abuse will not be supported and facilitate while she is in hospital; she then became tearful and said she wants help in coming off drugs and wishes to be referred to rehabilitation discharge.  Having hot flash Hot flashes  She insists she has never smoked today in her life, although her red or records list her as being a daily tobacco user  Has been using Percocet 7-8 x 10mg  per day, from the street   Xanax 4 tabs of 2 mg/day, from the street   Hospital Course:  1. DVT/PE  1. On therapeutic anticoagulation 2. No signs of bleeding 3. INR therapeutic, stable, goal between 2-3 4. Initially hypoxic (sats below 88%) on ambulation with CT + for PE 5. After treatment with steroids, sats improved to 91% on ambulation on RA and over 95% at rest. Therefore does not require home O2 2. Asthma exacerbation  1. No more wheezing 2. Resolved 3. Discharged with PO prednisone taper with albuterol MDI 3. Polysubstance abuse  1. Pt was recently admitted at [facility] s/p drug overdose 2. Avoid abusive drugs 3. Analgesics with NSAIDs as tolerated. 4. Recent encounter with family member suggests manipulative behavior of patient - see family encounter note from 09/12/13  Discharge Exam: Filed Vitals:   09/13/13 0500 09/13/13 0509 09/13/13 1224 09/13/13 1225  BP:  140/94    Pulse:  81    Temp:  98.6 F (37 C)    TempSrc:  Oral    Resp:  18    Height:      Weight: 87.8 kg (193 lb 9 oz)     SpO2:  98% 95% 93%    General: Awake, in nad Cardiovascular: regular, s1, s2 Respiratory: normal resp effort, no wheezing  Discharge Instructions     Medication List         albuterol 108 (90 BASE) MCG/ACT inhaler  Commonly known as:  PROVENTIL HFA;VENTOLIN HFA  Inhale 2 puffs into the lungs every  4 (four) hours as needed for shortness of breath.     gabapentin 300 MG capsule  Commonly known as:  NEURONTIN  Take 1 capsule (300 mg total) by mouth 3 (three) times daily.     ketorolac 10 MG tablet  Commonly known as:  TORADOL  Take 1 tablet (10 mg total) by mouth every 6 (six) hours as needed for moderate pain or severe pain.     predniSONE 20 MG tablet  Commonly known as:  DELTASONE  Take 3 tablets (60 mg total) by mouth daily with breakfast.     QUEtiapine 25 MG tablet  Commonly known as:  SEROQUEL  Take 1 tablet (25 mg total) by mouth at bedtime as needed (sleep).     sulfaSALAzine 500 MG  tablet  Commonly known as:  AZULFIDINE  Take 1,000 mg by mouth 2 (two) times daily.     warfarin 2.5 MG tablet  Commonly known as:  COUMADIN  Take 1 tablet (2.5 mg total) by mouth once.       No Known Allergies Follow-up Information   Follow up with [facility] On 09/16/2013. (Bring all information on the handout with you. Appt at 9:00 am)    Contact information:   [facility] [address] [id number]       The results of significant diagnostics from this hospitalization (including imaging, microbiology, ancillary and laboratory) are listed below for reference.    Significant Diagnostic Studies: Dg Chest 2 View  09/08/2013   CLINICAL DATA:  Shortness of breath, cough.  EXAM: CHEST  2 VIEW  COMPARISON:  August 20, 2013.  FINDINGS: The heart size and mediastinal contours are within normal limits. No pleural effusion or pneumothorax is noted. Both lungs are clear. The visualized skeletal structures are unremarkable.  IMPRESSION: No active cardiopulmonary disease.   Electronically Signed   By: Roque Lias M.D.   On: 09/08/2013 17:05   Dg Chest 2 View  08/20/2013   CLINICAL DATA:  Cough. Shortness of breath. Mid chest pain.  EXAM: CHEST  2 VIEW  COMPARISON:  12/16/2008  FINDINGS: The heart size and mediastinal contours are within normal limits. Both lungs are clear. 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 Bony thorax is otherwise intact.  IMPRESSION: No active cardiopulmonary disease.   Electronically Signed   By: Amie Portland M.D.   On: 08/20/2013 12:39   Ct Angio Chest Pe W/cm &/or Wo Cm  09/10/2013   CLINICAL DATA:  Chest pain and shortness of breath. History of a DVT.  EXAM: CT ANGIOGRAPHY CHEST WITH CONTRAST  TECHNIQUE: Multidetector CT imaging of the chest was performed using the standard protocol during bolus administration of intravenous contrast. Multiplanar CT image reconstructions including MIPs were obtained to evaluate the vascular  anatomy.  CONTRAST:  OMNIPAQUE IOHEXOL 350 MG/ML SOLN  COMPARISON:  Chest radiograph, 09/08/2013  FINDINGS: There are multiple segmental pulmonary emboli. On the right, there are segmental pulmonary emboli to the anterior and apical upper lobe segments. There are smaller segmental middle lobe emboli and there are segmental emboli to the right lower lobe, most prominent to the posterior-basilar segment. Less pulmonary emboli are noted on the left. There are segmental lower lobe branch emboli. No other convincing emboli. Overall embolus burden is moderate.  The right ventricular to left ventricular ratio is 0.973 which can be indicative of right heart strain.  The heart is normal in size. The great vessels are normal in  caliber. There is shotty mild mediastinal adenopathy. A 13 mm short axis subcarinal node is noted. There is an 11 mm short axis right peritracheal node. These are likely reactive. No mediastinal masses. No hilar masses or enlarged lymph nodes. There arm prominent hilar lymph nodes, however.  There are no areas of lung consolidation to suggest infarction. Subsegmental atelectasis is noted most evident in the left lower lobe. Reticular opacities are noted elsewhere, most prominent in the anteromedial right upper lobe and right middle lobe, also likely subsegmental atelectasis.  No pleural effusion.  No pneumothorax.  Limited evaluation of the upper abdomen is unremarkable.  Minimal degenerative changes are noted of the thoracic spine.  Review of the MIP images confirms the above findings.  IMPRESSION: 1. There are bilateral pulmonary emboli affecting segmental and smaller branches, more notable on the right. Overall clot burden is moderate. 2. RV/LV ratio is mildly increased, which can be indicative of right heart strain and sub massive pulmonary embolus. Clinical correlation is advised. 3. Lungs show areas of subsegmental atelectasis, but no evidence of infarction. No pleural effusion.    Electronically Signed   By: Amie Portland M.D.   On: 09/10/2013 16:38   US Venous Img Lower Unilateral Left  09/08/2013   CLINICAL DATA:  Left calf discomfort.  EXAM: Left LOWER EXTREMITY VENOUS DOPPLER ULTRASOUND  TECHNIQUE: Gray-scale sonography with graded compression, as well as color Doppler and duplex ultrasound, were performed to evaluate the deep venous system from the level of the common femoral vein through the popliteal and proximal calf veins. Spectral Doppler was utilized to evaluate flow at rest and with distal augmentation maneuvers.  COMPARISON:  None.  FINDINGS: Thrombus within deep veins: There is thrombus within the popliteal vein, posterior tibial vein, peroneal vein, and gastrocnemius vein. Evaluation of the anterior tibial vein is limited. There is no evidence of thrombus within the common femoral or superficial or deep chondral femoral veins. The greater saphenous vein appears patent.  Other findings:  None visualized.  IMPRESSION: There is deep venous thrombosis involving the left popliteal veins and the veins of the upper left calf.   Electronically Signed   By: David  [place]   On: 09/08/2013 17:23    Microbiology: No results found for this or any previous visit (from the past 240 hour(s)).   Labs: Basic Metabolic Panel:  Recent Labs Lab 09/08/13 1825 09/09/13 0600  NA 136 139  K 3.8 4.0  CL 97 101  CO2 31 28  GLUCOSE 200* 192*  BUN 7 5*  CREATININE 0.65 0.52  CALCIUM 8.7 9.1   Liver Function Tests:  Recent Labs Lab 09/08/13 1825 09/09/13 0600  AST 15 14  ALT 12 12  ALKPHOS 63 61  BILITOT 0.2* 0.2*  PROT 6.7 7.1  ALBUMIN 3.0* 2.9*   No results found for this basename: LIPASE, AMYLASE,  in the last 168 hours No results found for this basename: AMMONIA,  in the last 168 hours CBC:  Recent Labs Lab 09/08/13 1825 09/09/13 0600 09/11/13 0613  WBC 13.8* 11.5* 23.1*  NEUTROABS 10.2*  --   --   HGB 10.9* 10.5* 9.6*  HCT 34.5* 33.2* 30.2*  MCV 86.3  86.2 86.5  PLT 346 366 409*   Cardiac Enzymes: No results found for this basename: CKTOTAL, CKMB, CKMBINDEX, TROPONINI,  in the last 168 hours BNP: BNP (last 3 results) No results found for this basename: PROBNP,  in the last 8760 hours CBG: No results found for this basename:  GLUCAP,  in the last 168 hours   Signed:  Tinie Mcgloin K  [facility] 09/13/2013, 1:12 PM

## 2013-09-14 LAB — LUPUS ANTICOAGULANT PANEL
DRVVT: 36.7 s
Lupus Anticoagulant: NOT DETECTED
PTT Lupus Anticoagulant: 31.1 s (ref 28.0–43.0)

## 2013-09-14 LAB — PROTHROMBIN GENE MUTATION

## 2013-09-14 LAB — PROTEIN S ACTIVITY: Protein S Activity: 44 % — ABNORMAL LOW (ref 69–129)

## 2013-09-14 LAB — FACTOR 5 LEIDEN

## 2013-09-14 LAB — HOMOCYSTEINE: Homocysteine: 7.5 umol/L (ref 4.0–15.4)

## 2013-09-14 NOTE — Progress Notes (Signed)
UR chart review completed.  

## 2013-09-15 LAB — BETA-2-GLYCOPROTEIN I ABS, IGG/M/A
Beta-2 Glyco I IgG: 11 G Units (ref <20)
Beta-2-Glycoprotein I IgA: 0 A Units (ref <20)
Beta-2-Glycoprotein I IgM: 6 M Units (ref <20)

## 2013-09-15 LAB — PROTEIN S, TOTAL: Protein S Ag, Total: 80 % (ref 60–150)

## 2013-09-15 LAB — PROTEIN C, TOTAL: Protein C, Total: 84 % (ref 72–160)

## 2013-09-27 ENCOUNTER — Telehealth (HOSPITAL_COMMUNITY): Payer: Self-pay | Admitting: Dietician

## 2013-09-27 NOTE — Telephone Encounter (Signed)
Received calls from [facility] on 1430 and 1437. Pt being referred for dx: prediabetes. Appointment scheduled for 10/18/13 at 0900.

## 2013-10-18 ENCOUNTER — Telehealth (HOSPITAL_COMMUNITY): Payer: Self-pay | Admitting: Dietician

## 2013-10-18 NOTE — Telephone Encounter (Signed)
Pt was a no-show for appointment scheduled for 10/18/2013 at 0900. Sent letter to pt home notifying pt of no-show and requesting rescheduling appointment.

## 2016-04-11 ENCOUNTER — Encounter (HOSPITAL_COMMUNITY): Payer: Self-pay | Admitting: Emergency Medicine

## 2016-04-11 ENCOUNTER — Emergency Department (HOSPITAL_COMMUNITY): Payer: Self-pay

## 2016-04-11 ENCOUNTER — Emergency Department (HOSPITAL_COMMUNITY)
Admission: EM | Admit: 2016-04-11 | Discharge: 2016-04-11 | Disposition: A | Discharge status: Home / Self Care | Payer: Self-pay | Attending: Emergency Medicine | Admitting: Emergency Medicine

## 2016-04-11 DIAGNOSIS — Z7901 Long term (current) use of anticoagulants: Secondary | ICD-10-CM | POA: Insufficient documentation

## 2016-04-11 DIAGNOSIS — Z86718 Personal history of other venous thrombosis and embolism: Secondary | ICD-10-CM | POA: Insufficient documentation

## 2016-04-11 DIAGNOSIS — F1910 Other psychoactive substance abuse, uncomplicated: Secondary | ICD-10-CM | POA: Insufficient documentation

## 2016-04-11 DIAGNOSIS — Z79899 Other long term (current) drug therapy: Secondary | ICD-10-CM | POA: Insufficient documentation

## 2016-04-11 DIAGNOSIS — J45901 Unspecified asthma with (acute) exacerbation: Secondary | ICD-10-CM | POA: Insufficient documentation

## 2016-04-11 HISTORY — DX: Other pulmonary embolism without acute cor pulmonale: I26.99

## 2016-04-11 HISTORY — DX: Other psychoactive substance abuse, uncomplicated: F19.10

## 2016-04-11 LAB — PROTIME-INR
INR: 2 — ABNORMAL HIGH (ref 0.00–1.49)
Prothrombin Time: 22.6 seconds — ABNORMAL HIGH (ref 11.6–15.2)

## 2016-04-11 MED ORDER — PREDNISONE 50 MG PO TABS
60.0000 mg | ORAL_TABLET | Freq: Once | ORAL | Status: AC
  Administered 2016-04-11: 60 mg via ORAL
  Filled 2016-04-11: qty 1

## 2016-04-11 MED ORDER — PREDNISONE 20 MG PO TABS: 40.0000 mg | ORAL_TABLET | Freq: Every day | ORAL | Status: DC

## 2016-04-11 MED ORDER — ALBUTEROL SULFATE (2.5 MG/3ML) 0.083% IN NEBU
2.5000 mg | INHALATION_SOLUTION | Freq: Once | RESPIRATORY_TRACT | Status: AC
  Administered 2016-04-11: 2.5 mg via RESPIRATORY_TRACT
  Filled 2016-04-11: qty 3

## 2016-04-11 MED ORDER — IPRATROPIUM-ALBUTEROL 0.5-2.5 (3) MG/3ML IN SOLN
3.0000 mL | Freq: Once | RESPIRATORY_TRACT | Status: AC
  Administered 2016-04-11: 3 mL via RESPIRATORY_TRACT
  Filled 2016-04-11: qty 3

## 2016-04-11 MED ORDER — ALBUTEROL SULFATE HFA 108 (90 BASE) MCG/ACT IN AERS
2.0000 | INHALATION_SPRAY | RESPIRATORY_TRACT | Status: AC
  Administered 2016-04-11: 2 via RESPIRATORY_TRACT
  Filled 2016-04-11: qty 6.7

## 2016-04-11 NOTE — ED Provider Notes (Signed)
CSN: [id number]     Arrival date & time 04/11/16  1751 History   First MD Initiated Contact with Patient 04/11/16 1818     Chief Complaint  Patient presents with  . Shortness of Breath     HPI Pt was seen at 1820.  Per pt, c/o gradual onset and worsening of persistent cough, wheezing and SOB for the past 3 days.  Describes her symptoms as "my asthma is acting up."  Has been using home MDI without relief.  Denies CP/palpitations, no back pain, no abd pain, no N/V/D, no fevers, no rash.     Past Medical History  Diagnosis Date  . Asthma   . Ulcerative colitis (HCC)   . Bilateral ovarian cysts   . PE (pulmonary embolism)   . Polysubstance abuse    Past Surgical History  Procedure Laterality Date  . Back surgery     Family History  Problem Relation Age of Onset  . Lung cancer Mother    Social History  Substance Use Topics  . Smoking status: Never Smoker   . Smokeless tobacco: None  . Alcohol Use: No    Review of Systems ROS: Statement: All systems negative except as marked or noted in the HPI; Constitutional: Negative for fever and chills. ; ; Eyes: Negative for eye pain, redness and discharge. ; ; ENMT: Negative for ear pain, hoarseness, nasal congestion, sinus pressure and sore throat. ; ; Cardiovascular: Negative for chest pain, palpitations, diaphoresis, and peripheral edema. ; ; Respiratory: +SOB, wheezing, cough. Negative for stridor. ; ; Gastrointestinal: Negative for nausea, vomiting, diarrhea, abdominal pain, blood in stool, hematemesis, jaundice and rectal bleeding. . ; ; Genitourinary: Negative for dysuria, flank pain and hematuria. ; ; Musculoskeletal: Negative for back pain and neck pain. Negative for swelling and trauma.; ; Skin: Negative for pruritus, rash, abrasions, blisters, bruising and skin lesion.; ; Neuro: Negative for headache, lightheadedness and neck stiffness. Negative for weakness, altered level of consciousness, altered mental status, extremity weakness,  paresthesias, involuntary movement, seizure and syncope.      Allergies  Review of patient's allergies indicates no known allergies.  Home Medications   Prior to Admission medications   Medication Sig Start Date End Date Taking? Authorizing Provider  albuterol (PROVENTIL HFA;VENTOLIN HFA) 108 (90 BASE) MCG/ACT inhaler Inhale 2 puffs into the lungs every 4 (four) hours as needed for shortness of breath. 09/13/13  Yes Jerald KiefStephen K Chiu, MD  naproxen (NAPROSYN) 500 MG tablet Take 500 mg by mouth 2 (two) times daily with a meal.   Yes Historical Provider, MD  sulfaSALAzine (AZULFIDINE) 500 MG tablet Take 1,000 mg by mouth 2 (two) times daily.   Yes Historical Provider, MD  warfarin (COUMADIN) 5 MG tablet Take 5 mg by mouth daily.   Yes Historical Provider, MD   BP 140/99 mmHg  Pulse 94  Temp(Src) 97.5 F (36.4 C) (Oral)  Resp 18  Ht 5\' 7"  (1.702 m)  Wt 190 lb (86.183 kg)  BMI 29.75 kg/m2  SpO2 96%  LMP 02/10/2016 Physical Exam  1825: Physical examination:  Nursing notes reviewed; Vital signs and O2 SAT reviewed;  Constitutional: Well developed, Well nourished, Well hydrated, In no acute distress; Head:  Normocephalic, atraumatic; Eyes: EOMI, PERRL, No scleral icterus; ENMT: Mouth and pharynx normal, Mucous membranes moist. Mouth and pharynx without lesions. No tonsillar exudates. No intra-oral edema. No submandibular or sublingual edema. No hoarse voice, no drooling, no stridor. No pain with manipulation of larynx. No trismus. ; Neck: Supple,  Full range of motion, No lymphadenopathy; Cardiovascular: Regular rate and rhythm, No gallop; Respiratory: Breath sounds coarse & equal bilaterally, exp wheezes bilat. +audible wheezing.  Speaking full sentences with ease, Normal respiratory effort/excursion; Chest: Nontender, Movement normal; Abdomen: Soft, Nontender, Nondistended, Normal bowel sounds; Genitourinary: No CVA tenderness; Extremities: Pulses normal, No tenderness, No edema, No calf edema or  asymmetry.; Neuro: AA&Ox3, Major CN grossly intact.  Speech clear. No gross focal motor or sensory deficits in extremities. Climbs on and off stretcher easily by herself. Gait steady.; Skin: Color normal, Warm, Dry.   ED Course  Procedures (including critical care time) Labs Review  Imaging Review  I have personally reviewed and evaluated these images and lab results as part of my medical decision-making.   EKG Interpretation None      MDM  MDM Reviewed: previous chart, nursing note and vitals Interpretation: x-ray     Results for orders placed or performed during the hospital encounter of 04/11/16  Protime-INR  Result Value Ref Range   Prothrombin Time 22.6 (H) 11.6 - 15.2 seconds   INR 2.00 (H) 0.00 - 1.49   Dg Chest 2 View 04/11/2016  CLINICAL DATA:  Shortness of breath for 2 days. EXAM: CHEST  2 VIEW COMPARISON:  09/10/2013 FINDINGS: The heart size and mediastinal contours are within normal limits. Both lungs are clear. The visualized skeletal structures are unremarkable. IMPRESSION: No active cardiopulmonary disease. Electronically Signed   By: Elige KoHetal  Patel   On: 04/11/2016 18:53    2040:  Pt states she "feels better" after neb and steroid.  NAD, lungs CTA bilat, no wheezing, resps easy, speaking full sentences, Sats 96-99% R/A.  Pt ambulated around the ED with Sats remaining 94-99 % R/A, resps easy, NAD. Lungs coarse, no wheezing, after ambulation. Pt requesting another neb tx and then wants to go home. After 2nd neb, pt now ambulatory around the ED, stating she "feels better." Pt aware of INR, is due for her dose of coumadin when she gets home. Tx for asthma exacerbation. Dx and testing d/w pt and family.  Questions answered.  Verb understanding, agreeable to d/c home with outpt f/u.    Samuel JesterKathleen Louvinia Cumbo, DO 04/14/16 1435

## 2016-04-11 NOTE — Discharge Instructions (Signed)
Take the prescription as directed.  Use your albuterol inhaler (2 to 4 puffs) every 4 hours for the next 7 days, then as needed for cough, wheezing, or shortness of breath.  Call your regular medical doctor tomorrow morning to schedule a follow up appointment within the next 2 to 3 days.  Return to the Emergency Department immediately sooner if worsening.

## 2016-04-11 NOTE — ED Notes (Addendum)
Patient complaining of shortness of breath x 2 days. States she has history of asthma and inhaler and breathing treatments are not effective.

## 2016-04-11 NOTE — ED Notes (Signed)
Pt ambulated twice around nurses station, O2 saturation stayed between 94-99% on RA. Pt stated she felt like she was beginning to wheeze, no audible wheezes heard while ambulating.

## 2016-04-11 NOTE — ED Notes (Signed)
Pt states shortness of breath for 3 days with inhaler not working.  Pt reports a dry, non productive cough.

## 2016-04-15 ENCOUNTER — Ambulatory Visit: Payer: Self-pay | Admitting: Physician Assistant

## 2016-04-15 ENCOUNTER — Encounter: Payer: Self-pay | Admitting: Physician Assistant

## 2016-04-15 VITALS — BP 116/74 | HR 80 | Temp 97.5°F | Ht 66.25 in | Wt 193.0 lb

## 2016-04-15 DIAGNOSIS — Z13220 Encounter for screening for lipoid disorders: Secondary | ICD-10-CM

## 2016-04-15 DIAGNOSIS — K5190 Ulcerative colitis, unspecified, without complications: Secondary | ICD-10-CM

## 2016-04-15 DIAGNOSIS — J45909 Unspecified asthma, uncomplicated: Secondary | ICD-10-CM

## 2016-04-15 DIAGNOSIS — Z131 Encounter for screening for diabetes mellitus: Secondary | ICD-10-CM

## 2016-04-15 DIAGNOSIS — E669 Obesity, unspecified: Secondary | ICD-10-CM

## 2016-04-15 DIAGNOSIS — Z7901 Long term (current) use of anticoagulants: Secondary | ICD-10-CM

## 2016-04-15 LAB — GLUCOSE, POCT (MANUAL RESULT ENTRY): POC GLUCOSE: 218 mg/dL — AB (ref 70–99)

## 2016-04-15 NOTE — Progress Notes (Signed)
BP 116/74 mmHg  Pulse 80  Temp(Src) 97.5 F (36.4 C)  Ht 5' 6.25" (1.683 m)  Wt 193 lb (87.544 kg)  BMI 30.91 kg/m2  SpO2 97%  LMP 02/10/2016   Subjective:    Patient ID: Melanie Mendoza Divito, female    DOB: 17-Jul-1971, 45 y.o.   MRN: [id number]  HPI: Melanie Mendoza Pickerill is a 45 y.o. female presenting on 04/15/2016 for New Patient (Initial Visit)   HPI   Pt got out of jail June 11.  Pt says she is [facility]  [facility]  Last time seen GI for ulcerative colitis was "long time ago" - saw dr Karilyn Cotarehman in past.    Relevant past medical, surgical, family and social history reviewed and updated as indicated. Interim medical history since our last visit reviewed. Allergies and medications reviewed and updated.   Current outpatient prescriptions:  .  albuterol (PROVENTIL HFA;VENTOLIN HFA) 108 (90 BASE) MCG/ACT inhaler, Inhale 2 puffs into the lungs every 4 (four) hours as needed for shortness of breath., Disp: 1 Inhaler, Rfl: 0 .  naproxen (NAPROSYN) 500 MG tablet, Take 500 mg by mouth 2 (two) times daily with a meal., Disp: , Rfl:  .  predniSONE (DELTASONE) 20 MG tablet, Take 2 tablets (40 mg total) by mouth daily., Disp: 10 tablet, Rfl: 0 .  sulfaSALAzine (AZULFIDINE) 500 MG tablet, Take 1,000 mg by mouth 2 (two) times daily., Disp: , Rfl:  .  warfarin (COUMADIN) 5 MG tablet, Take 5 mg by mouth daily., Disp: , Rfl:    Review of Systems  Constitutional: Negative for fever, chills, diaphoresis, appetite change, fatigue and unexpected weight change.  HENT: Positive for congestion and sneezing. Negative for dental problem, drooling, ear pain, facial swelling, hearing loss, mouth sores, sore throat, trouble swallowing and voice change.   Eyes: Positive for itching. Negative for pain, discharge, redness and visual disturbance.  Respiratory: Positive for cough, shortness of breath and wheezing. Negative for  choking.   Cardiovascular: Negative for chest pain, palpitations and leg swelling.  Gastrointestinal: Negative for vomiting, abdominal pain, diarrhea, constipation and blood in stool.  Endocrine: Negative for cold intolerance, heat intolerance and polydipsia.  Genitourinary: Negative for dysuria, hematuria and decreased urine volume.  Musculoskeletal: Positive for back pain and arthralgias. Negative for gait problem.  Skin: Negative for rash.  Allergic/Immunologic: Negative for environmental allergies.  Neurological: Negative for seizures, syncope, light-headedness and headaches.  Hematological: Negative for adenopathy.  Psychiatric/Behavioral: Negative for suicidal ideas, dysphoric mood and agitation. The patient is nervous/anxious.     Per HPI unless specifically indicated above     Objective:    BP 116/74 mmHg  Pulse 80  Temp(Src) 97.5 F (36.4 C)  Ht 5' 6.25" (1.683 m)  Wt 193 lb (87.544 kg)  BMI 30.91 kg/m2  SpO2 97%  LMP 02/10/2016  Wt Readings from Last 3 Encounters:  04/15/16 193 lb (87.544 kg)  04/11/16 190 lb (86.183 kg)  09/13/13 193 lb 9 oz (87.8 kg)    Physical Exam  Constitutional: She is oriented to person, place, and time. She appears well-developed and well-nourished.  HENT:  Head: Normocephalic and atraumatic.  Mouth/Throat: Oropharynx is clear and moist. No oropharyngeal exudate.  Eyes: Conjunctivae and EOM are normal. Pupils are equal, round, and reactive to light.  Neck: Neck supple. No thyromegaly present.  Cardiovascular: Normal rate and regular rhythm.   Pulmonary/Chest:  Effort normal and breath sounds normal.  Abdominal: Soft. Bowel sounds are normal. She exhibits no mass. There is no hepatosplenomegaly. There is no tenderness.  Musculoskeletal: She exhibits no edema.  Lymphadenopathy:    She has no cervical adenopathy.  Neurological: She is alert and oriented to person, place, and time. Gait normal.  Skin: Skin is warm and dry.  Psychiatric:  She has a normal mood and affect. Her behavior is normal.  Vitals reviewed.   Results for orders placed or performed in visit on 04/15/16  POCT Glucose (CBG)  Result Value Ref Range   POC Glucose 218 (A) 70 - 99 mg/dl      Assessment & Plan:   Encounter Diagnoses  Name Primary?  . On anticoagulant therapy Yes  . Ulcerative colitis without complications, unspecified location (HCC)   . Asthma, unspecified asthma severity, uncomplicated   . Screening for diabetes mellitus   . Screening cholesterol level   . Obesity, unspecified      -get baseline Labs -will check on need for mammogram- pt states had abnormal done in jail. Get screening if needed- [facility]- records received- done 09/04/15 with rec to repeat 1 year -Check on place for coumadin monitoring- [facility] -will try to get pt in with specialist to determine if pt still needs to be on anticoagulation therapy -pt needs to get back in with GI for UC -pt was given [facility] discount application -f/u OV 1 month.  RTO sooner prn

## 2016-04-16 DIAGNOSIS — Z7901 Long term (current) use of anticoagulants: Secondary | ICD-10-CM | POA: Insufficient documentation

## 2016-04-16 DIAGNOSIS — E669 Obesity, unspecified: Secondary | ICD-10-CM | POA: Insufficient documentation

## 2016-04-20 LAB — CBC WITH DIFFERENTIAL/PLATELET
BASOS PCT: 0 %
Basophils Absolute: 0 cells/uL (ref 0–200)
EOS ABS: 395 {cells}/uL (ref 15–500)
Eosinophils Relative: 5 %
HEMATOCRIT: 42.2 % (ref 35.0–45.0)
Hemoglobin: 14.1 g/dL (ref 11.7–15.5)
Lymphocytes Relative: 30 %
Lymphs Abs: 2370 cells/uL (ref 850–3900)
MCH: 29.3 pg (ref 27.0–33.0)
MCHC: 33.4 g/dL (ref 32.0–36.0)
MCV: 87.6 fL (ref 80.0–100.0)
MONO ABS: 632 {cells}/uL (ref 200–950)
MPV: 9 fL (ref 7.5–12.5)
Monocytes Relative: 8 %
Neutro Abs: 4503 cells/uL (ref 1500–7800)
Neutrophils Relative %: 57 %
Platelets: 461 10*3/uL — ABNORMAL HIGH (ref 140–400)
RBC: 4.82 MIL/uL (ref 3.80–5.10)
RDW: 14.1 % (ref 11.0–15.0)
WBC: 7.9 10*3/uL (ref 3.8–10.8)

## 2016-04-20 LAB — LIPID PANEL
CHOL/HDL RATIO: 6.6 ratio — AB (ref <=5.0)
CHOLESTEROL: 284 mg/dL — AB (ref 125–200)
HDL: 43 mg/dL — ABNORMAL LOW (ref >=46)
LDL Cholesterol: 178 mg/dL — ABNORMAL HIGH (ref <130)
TRIGLYCERIDES: 317 mg/dL — AB (ref <150)
VLDL: 63 mg/dL — ABNORMAL HIGH (ref <30)

## 2016-04-20 LAB — COMPLETE METABOLIC PANEL WITH GFR
ALT: 17 U/L (ref 6–29)
AST: 17 U/L (ref 10–35)
Albumin: 3.9 g/dL (ref 3.6–5.1)
Alkaline Phosphatase: 66 U/L (ref 33–115)
BILIRUBIN TOTAL: 0.5 mg/dL (ref 0.2–1.2)
BUN: 11 mg/dL (ref 7–25)
CO2: 32 mmol/L — AB (ref 20–31)
CREATININE: 0.78 mg/dL (ref 0.50–1.10)
Calcium: 9.2 mg/dL (ref 8.6–10.2)
Chloride: 99 mmol/L (ref 98–110)
GFR, Est Non African American: >89 mL/min (ref >=60)
GLUCOSE: 103 mg/dL — AB (ref 65–99)
Potassium: 4.7 mmol/L (ref 3.5–5.3)
Sodium: 139 mmol/L (ref 135–146)
TOTAL PROTEIN: 6.4 g/dL (ref 6.1–8.1)

## 2016-04-20 LAB — PROTIME-INR
INR: 1.3 — ABNORMAL HIGH
Prothrombin Time: 13.6 s — ABNORMAL HIGH (ref 9.0–11.5)

## 2016-04-21 LAB — HEMOGLOBIN A1C
Hgb A1c MFr Bld: 5.9 % — ABNORMAL HIGH (ref <5.7)
MEAN PLASMA GLUCOSE: 123 mg/dL

## 2016-04-22 ENCOUNTER — Other Ambulatory Visit: Payer: Self-pay | Admitting: Physician Assistant

## 2016-04-22 DIAGNOSIS — Z7901 Long term (current) use of anticoagulants: Secondary | ICD-10-CM

## 2016-04-22 DIAGNOSIS — Z86711 Personal history of pulmonary embolism: Secondary | ICD-10-CM

## 2016-05-01 ENCOUNTER — Encounter (INDEPENDENT_AMBULATORY_CARE_PROVIDER_SITE_OTHER): Payer: Self-pay | Admitting: Internal Medicine

## 2016-05-01 ENCOUNTER — Encounter: Payer: Self-pay | Admitting: Physician Assistant

## 2016-05-06 ENCOUNTER — Ambulatory Visit (INDEPENDENT_AMBULATORY_CARE_PROVIDER_SITE_OTHER): Payer: Self-pay | Admitting: Cardiology

## 2016-05-06 ENCOUNTER — Encounter: Payer: Self-pay | Admitting: Cardiology

## 2016-05-06 VITALS — BP 128/82 | HR 92 | Ht 67.0 in | Wt 193.0 lb

## 2016-05-06 DIAGNOSIS — Z136 Encounter for screening for cardiovascular disorders: Secondary | ICD-10-CM

## 2016-05-06 DIAGNOSIS — I2699 Other pulmonary embolism without acute cor pulmonale: Secondary | ICD-10-CM

## 2016-05-06 MED ORDER — ALBUTEROL SULFATE HFA 108 (90 BASE) MCG/ACT IN AERS: 2.0000 | INHALATION_SPRAY | RESPIRATORY_TRACT | 3 refills | Status: DC | PRN

## 2016-05-06 MED ORDER — ALBUTEROL SULFATE (2.5 MG/3ML) 0.083% IN NEBU: 2.5000 mg | INHALATION_SOLUTION | Freq: Four times a day (QID) | RESPIRATORY_TRACT | 12 refills | Status: DC | PRN

## 2016-05-06 NOTE — Progress Notes (Signed)
Clinical Summary Melanie Mendoza is a 45 y.o.female seen today as a new patient, she is referred by PA Jacquelin Hawking.   1. History of PE - admit 08/2013 with PE. CT showed bilateral emboli, mild RV strain. +DVT.  - she denies any clear causative events around that time. - on coumadin since that time. Denies any bleeding issues on coumadin. - she denies any prior history of clots. Mother with history of blood clots, rheumatoid arthritis.    2. Asthma - ran out of her prn albuterol   Past Medical History:  Diagnosis Date  . Asthma   . Bilateral ovarian cysts   . PE (pulmonary embolism) 09/2013  . Polysubstance abuse   . Ulcerative colitis (HCC)      No Known Allergies   Current Outpatient Prescriptions  Medication Sig Dispense Refill  . albuterol (PROVENTIL HFA;VENTOLIN HFA) 108 (90 BASE) MCG/ACT inhaler Inhale 2 puffs into the lungs every 4 (four) hours as needed for shortness of breath. 1 Inhaler 0  . naproxen (NAPROSYN) 500 MG tablet Take 500 mg by mouth 2 (two) times daily with a meal.    . predniSONE (DELTASONE) 20 MG tablet Take 2 tablets (40 mg total) by mouth daily. 10 tablet 0  . sulfaSALAzine (AZULFIDINE) 500 MG tablet Take 1,000 mg by mouth 2 (two) times daily.    Marland Kitchen warfarin (COUMADIN) 5 MG tablet Take 5 mg by mouth daily.     No current facility-administered medications for this visit.      No past surgical history on file.   No Known Allergies    Family History  Problem Relation Age of Onset  . Lung cancer Mother   . Cancer Mother      Social History Melanie Mendoza reports that she has never smoked. She has never used smokeless tobacco. Melanie Mendoza reports that she does not drink alcohol.   Review of Systems CONSTITUTIONAL: No weight loss, fever, chills, weakness or fatigue.  HEENT: Eyes: No visual loss, blurred vision, double vision or yellow sclerae.No hearing loss, sneezing, congestion, runny nose or sore throat.  SKIN: No rash or itching.    CARDIOVASCULAR: no chest pain, no palpitations RESPIRATORY: No shortness of breath, cough or sputum.  GASTROINTESTINAL: No anorexia, nausea, vomiting or diarrhea. No abdominal pain or blood.  GENITOURINARY: No burning on urination, no polyuria NEUROLOGICAL: No headache, dizziness, syncope, paralysis, ataxia, numbness or tingling in the extremities. No change in bowel or bladder control.  MUSCULOSKELETAL: No muscle, back pain, joint pain or stiffness.  LYMPHATICS: No enlarged nodes. No history of splenectomy.  PSYCHIATRIC: No history of depression or anxiety.  ENDOCRINOLOGIC: No reports of sweating, cold or heat intolerance. No polyuria or polydipsia.  Marland Kitchen   Physical Examination Vitals:   05/06/16 1017  BP: 128/82  Pulse: 92   Vitals:   05/06/16 1017  Weight: 193 lb (87.5 kg)  Height:  (1.702 m)    Gen: resting comfortably, no acute distress HEENT: no scleral icterus, pupils equal round and reactive, no palptable cervical adenopathy,  CV: RRR, no m//r,g no jvd Resp: Clear to auscultation bilaterally GI: abdomen is soft, non-tender, non-distended, normal bowel sounds, no hepatosplenomegaly MSK: extremities are warm, no edema.  Skin: warm, no rash Neuro:  no focal deficits Psych: appropriate affect    05/06/16 CLinic EKG: NSR Assessment and Plan  1. PE - by history appears to have been unprovoked PE. She does report her mother has history of clots as well as  as RA - she will conitnue coumadin at this time, we will enroll her in our coumadin clinic. She is not interested in NOACs due to cost - refer to hematology to help assess limited vs indefinite anticoagulation   2. Asthma - we will refill her prn albuterol inhaler and nebulizer.     F/u 1 year  Antoine Poche, M.D.

## 2016-05-06 NOTE — Patient Instructions (Signed)
Medication Instructions:  Refilled albuterol inhaler  USE NEBULIZER AS DIRECTED   Labwork: NONE  Testing/Procedures: NONE  Follow-Up: Your physician wants you to follow-up in: 1 YEAR .  You will receive a reminder letter in the mail two months in advance. If you don't receive a letter, please call our office to schedule the follow-up appointment.   Any Other Special Instructions Will Be Listed Below (If Applicable). YOU HAVE BEEN REFERRED TO HEMATOLOGY FOR PULMONARY EMBOLISM YOU HAVE BEEN REFERRED TO COUMADIN CLINIC FOR PULMONARY EMBOLISM     If you need a refill on your cardiac medications before your next appointment, please call your pharmacy.

## 2016-05-08 ENCOUNTER — Ambulatory Visit (INDEPENDENT_AMBULATORY_CARE_PROVIDER_SITE_OTHER): Payer: Self-pay | Admitting: *Deleted

## 2016-05-08 DIAGNOSIS — I2699 Other pulmonary embolism without acute cor pulmonale: Secondary | ICD-10-CM

## 2016-05-08 DIAGNOSIS — I82499 Acute embolism and thrombosis of other specified deep vein of unspecified lower extremity: Secondary | ICD-10-CM

## 2016-05-08 DIAGNOSIS — Z7901 Long term (current) use of anticoagulants: Secondary | ICD-10-CM

## 2016-05-08 LAB — POCT INR: INR: 1.3

## 2016-05-15 ENCOUNTER — Ambulatory Visit: Payer: Self-pay | Admitting: Physician Assistant

## 2016-05-20 ENCOUNTER — Encounter (INDEPENDENT_AMBULATORY_CARE_PROVIDER_SITE_OTHER): Payer: Self-pay | Admitting: Internal Medicine

## 2016-05-20 ENCOUNTER — Ambulatory Visit (INDEPENDENT_AMBULATORY_CARE_PROVIDER_SITE_OTHER): Payer: Self-pay | Admitting: Internal Medicine

## 2016-05-28 ENCOUNTER — Ambulatory Visit: Payer: Self-pay | Admitting: Gastroenterology

## 2016-06-12 ENCOUNTER — Institutional Professional Consult (permissible substitution): Payer: Self-pay | Admitting: Pulmonary Disease

## 2016-06-24 ENCOUNTER — Ambulatory Visit (INDEPENDENT_AMBULATORY_CARE_PROVIDER_SITE_OTHER): Payer: Self-pay | Admitting: *Deleted

## 2016-06-24 DIAGNOSIS — Z7901 Long term (current) use of anticoagulants: Secondary | ICD-10-CM

## 2016-06-24 DIAGNOSIS — I2699 Other pulmonary embolism without acute cor pulmonale: Secondary | ICD-10-CM

## 2016-06-24 DIAGNOSIS — I82499 Acute embolism and thrombosis of other specified deep vein of unspecified lower extremity: Secondary | ICD-10-CM

## 2016-06-24 LAB — POCT INR: INR: 1.5

## 2016-06-24 MED ORDER — WARFARIN SODIUM 5 MG PO TABS: 5.0000 mg | ORAL_TABLET | Freq: Every day | ORAL | 0 refills | Status: DC

## 2016-06-25 ENCOUNTER — Encounter (HOSPITAL_COMMUNITY): Payer: Self-pay | Admitting: Emergency Medicine

## 2016-06-25 ENCOUNTER — Emergency Department (HOSPITAL_COMMUNITY)
Admission: EM | Admit: 2016-06-25 | Discharge: 2016-06-26 | Disposition: A | Discharge status: Home / Self Care | Payer: Self-pay | Attending: Emergency Medicine | Admitting: Emergency Medicine

## 2016-06-25 ENCOUNTER — Emergency Department (HOSPITAL_COMMUNITY): Payer: Self-pay

## 2016-06-25 DIAGNOSIS — I87009 Postthrombotic syndrome without complications of unspecified extremity: Secondary | ICD-10-CM | POA: Insufficient documentation

## 2016-06-25 DIAGNOSIS — J45901 Unspecified asthma with (acute) exacerbation: Secondary | ICD-10-CM | POA: Insufficient documentation

## 2016-06-25 DIAGNOSIS — Z79899 Other long term (current) drug therapy: Secondary | ICD-10-CM | POA: Insufficient documentation

## 2016-06-25 DIAGNOSIS — Z7951 Long term (current) use of inhaled steroids: Secondary | ICD-10-CM | POA: Insufficient documentation

## 2016-06-25 DIAGNOSIS — Z86711 Personal history of pulmonary embolism: Secondary | ICD-10-CM | POA: Insufficient documentation

## 2016-06-25 DIAGNOSIS — Z7901 Long term (current) use of anticoagulants: Secondary | ICD-10-CM | POA: Insufficient documentation

## 2016-06-25 MED ORDER — MAGNESIUM SULFATE 2 GM/50ML IV SOLN
2.0000 g | Freq: Once | INTRAVENOUS | Status: AC
  Administered 2016-06-25: 2 g via INTRAVENOUS
  Filled 2016-06-25: qty 50

## 2016-06-25 MED ORDER — ALBUTEROL (5 MG/ML) CONTINUOUS INHALATION SOLN
15.0000 mg/h | INHALATION_SOLUTION | RESPIRATORY_TRACT | Status: DC
  Administered 2016-06-25: 15 mg/h via RESPIRATORY_TRACT
  Filled 2016-06-25: qty 20

## 2016-06-25 MED ORDER — METHYLPREDNISOLONE SODIUM SUCC 125 MG IJ SOLR
125.0000 mg | Freq: Once | INTRAMUSCULAR | Status: AC
  Administered 2016-06-25: 125 mg via INTRAVENOUS
  Filled 2016-06-25: qty 2

## 2016-06-25 MED ORDER — IPRATROPIUM-ALBUTEROL 0.5-2.5 (3) MG/3ML IN SOLN: 3.0000 mL | Freq: Once | RESPIRATORY_TRACT | Status: DC

## 2016-06-25 MED ORDER — IPRATROPIUM BROMIDE 0.02 % IN SOLN
1.0000 mg | Freq: Once | RESPIRATORY_TRACT | Status: AC
  Administered 2016-06-25: 1 mg via RESPIRATORY_TRACT
  Filled 2016-06-25: qty 5

## 2016-06-25 NOTE — ED Triage Notes (Signed)
Pt c/o increased sob x 30 minutes ago. Pt states she has taken 2 breathing treatments with no relief.

## 2016-06-25 NOTE — ED Provider Notes (Signed)
[facility]-EMERGENCY DEPT Provider Note   CSN: [id number] Arrival date & time: 06/25/16  2035  By signing my name below, I, Christy SartoriusAnastasia Kolousek, attest that this documentation has been prepared under the direction and in the presence of Marily MemosJason Alontae Chaloux, MD . Electronically Signed: Christy SartoriusAnastasia Kolousek, Scribe. 06/25/2016. 9:47 PM.  History   Chief Complaint Chief Complaint  Patient presents with  . Shortness of Breath    The history is provided by the patient and medical records. No language interpreter was used.    HPI Comments:  Melanie Mendoza is a 45 y.o. female with a history of asthma who presents to the Emergency Department complaining of SOB beginning at 2030.  She was seen in the ED for an asthma attack in June of this year, but reports that this is worse.  She has done 2 albuterol breathing treatments today without relief.  She has a history of pulmonary embolism in 2014 and is currently taking warfarin, however she states she is only able to take it every other day as her prescription is running low.  She denies chest pain, back pain and fevers.     Past Medical History:  Diagnosis Date  . Asthma   . Bilateral ovarian cysts   . PE (pulmonary embolism) 09/2013  . Polysubstance abuse   . Ulcerative colitis [facility](HCC)     Patient Active Problem List   Diagnosis Date Noted  . Long term (current) use of anticoagulants [Z79.01] 05/08/2016  . On anticoagulant therapy 04/16/2016  . Obesity, unspecified 04/16/2016  . Manipulative behavior 09/13/2013  . Acute pulmonary embolism (HCC) 09/13/2013  . Asthma with bronchitis 09/08/2013  . Polysubstance abuse 09/08/2013  . Narcotic abuse 09/08/2013  . Hypoxia 09/08/2013  . DVT of leg (deep venous thrombosis) (HCC) 09/08/2013  . Anemia 09/08/2013  . Overdose of benzodiazepine 08/20/2013  . Acute encephalopathy 08/20/2013  . Acute respiratory failure (HCC) 08/20/2013  . Asthma exacerbation 08/20/2013  . Leukocytosis 08/20/2013    History  reviewed. No pertinent surgical history.  OB History    Gravida Para Term Preterm AB Living             2   SAB TAB Ectopic Multiple Live Births                   Home Medications    Prior to Admission medications   Medication Sig Start Date End Date Taking? Authorizing Provider  albuterol (PROVENTIL HFA;VENTOLIN HFA) 108 (90 Base) MCG/ACT inhaler Inhale 2 puffs into the lungs every 4 (four) hours as needed for shortness of breath. 05/06/16  Yes Antoine PocheJonathan F Branch, MD  albuterol (PROVENTIL) (2.5 MG/3ML) 0.083% nebulizer solution Take 3 mLs (2.5 mg total) by nebulization every 6 (six) hours as needed for wheezing or shortness of breath. 05/06/16  Yes Antoine PocheJonathan F Branch, MD  sulfaSALAzine (AZULFIDINE) 500 MG tablet Take 500 mg by mouth 2 (two) times daily.    Yes Historical Provider, MD  warfarin (COUMADIN) 5 MG tablet Take 1 tablet (5 mg total) by mouth daily. 06/24/16  Yes Antoine PocheJonathan F Branch, MD  predniSONE (DELTASONE) 20 MG tablet 2 tabs po daily x 4 days 06/26/16   Marily MemosJason Kavari Parrillo, MD    Family History Family History  Problem Relation Age of Onset  . Lung cancer Mother   . Cancer Mother     Social History Social History  Substance Use Topics  . Smoking status: Never Smoker  . Smokeless tobacco: Never Used  . Alcohol use No  Allergies   Review of patient's allergies indicates no known allergies.   Review of Systems Review of Systems  Constitutional: Negative for fever.  Respiratory: Positive for shortness of breath.   Cardiovascular: Negative for chest pain.  Musculoskeletal: Negative for back pain.  All other systems reviewed and are negative.    Physical Exam Updated Vital Signs BP 136/88   Pulse 104 Comment: Simultaneous filing. User may not have seen previous data.  Temp 98.3 F (36.8 C) (Oral)   Resp 18   Ht 5\' 7"  (1.702 m)   Wt 195 lb (88.5 kg)   LMP 06/25/2016   SpO2 96% Comment: Simultaneous filing. User may not have seen previous data.  BMI 30.54  kg/m   Physical Exam  Constitutional: She is oriented to person, place, and time. She appears well-developed and well-nourished. No distress.  HENT:  Head: Normocephalic and atraumatic.  Eyes: Conjunctivae are normal.  Cardiovascular: Normal rate and normal heart sounds.   Tachycardic  Pulmonary/Chest: Effort normal. She has wheezes.  Tachypneic and hypoxic.  Diffuse wheezing decreased breath sounds.   Neurological: She is alert and oriented to person, place, and time.  Skin: Skin is warm and dry.  Psychiatric: She has a normal mood and affect.  Nursing note and vitals reviewed.    ED Treatments / Results   DIAGNOSTIC STUDIES:  Oxygen Saturation is 94% on RA, NML by my interpretation.    COORDINATION OF CARE:  9:47 PM Discussed treatment plan with pt at bedside and pt agreed to plan.  Labs (all labs ordered are listed, but only abnormal results are displayed) Labs Reviewed - No data to display  EKG  EKG Interpretation None       Radiology Dg Chest 2 View  Result Date: 06/25/2016 CLINICAL DATA:  Worsening dyspnea over the past 30 minutes. No relief from breathing treatments. EXAM: CHEST  2 VIEW COMPARISON:  04/11/2016 FINDINGS: Mild hyperinflation. No airspace consolidation. No effusions. Normal pulmonary vasculature. Normal hilar and mediastinal contours. Heart size is normal, unchanged. IMPRESSION: Mild hyperinflation. Electronically Signed   By: Ellery Plunk M.D.   On: 06/25/2016 23:10    Procedures Procedures (including critical care time)  Medications Ordered in ED Medications  albuterol (PROVENTIL,VENTOLIN) solution continuous neb (15 mg/hr Nebulization New Bag/Given 06/25/16 2152)  albuterol (PROVENTIL HFA;VENTOLIN HFA) 108 (90 Base) MCG/ACT inhaler 2 puff (not administered)  ipratropium (ATROVENT) nebulizer solution 1 mg (1 mg Nebulization Given 06/25/16 2152)  methylPREDNISolone sodium succinate (SOLU-MEDROL) 125 mg/2 mL injection 125 mg (125 mg  Intravenous Given 06/25/16 2213)  magnesium sulfate IVPB 2 g 50 mL (0 g Intravenous Stopped 06/25/16 2305)     Initial Impression / Assessment and Plan / ED Course  I have reviewed the triage vital signs and the nursing notes.  Pertinent labs & imaging results that were available during my care of the patient were reviewed by me and considered in my medical decision making (see chart for details).  Clinical Course    With asthma exacerbation that improved significantly with duo nebs, magnesium and slight Medrol. Patient observed for another hour after breathing treatments continued to do well. I suspect this is more likely asthma than pulmonary embolus as it improved significantly with bronchodilators. We'll discharge on a four-day course of steroids and have her follow-up with her primary doctor.  Final Clinical Impressions(s) / ED Diagnoses   Final diagnoses:  Asthma exacerbation    New Prescriptions New Prescriptions   PREDNISONE (DELTASONE) 20 MG TABLET  2 tabs po daily x 4 days   I personally performed the services described in this documentation, which was scribed in my presence. The recorded information has been reviewed and is accurate.    Marily Memos, MD 06/26/16 [phone]

## 2016-06-25 NOTE — ED Notes (Signed)
MD at bedside. 

## 2016-06-26 MED ORDER — ALBUTEROL SULFATE HFA 108 (90 BASE) MCG/ACT IN AERS
2.0000 | INHALATION_SPRAY | Freq: Once | RESPIRATORY_TRACT | Status: AC
  Administered 2016-06-26: 2 via RESPIRATORY_TRACT
  Filled 2016-06-26: qty 6.7

## 2016-06-26 MED ORDER — PREDNISONE 20 MG PO TABS: ORAL_TABLET | ORAL | 0 refills | Status: DC

## 2016-07-01 ENCOUNTER — Encounter: Payer: Self-pay | Admitting: Physician Assistant

## 2016-07-01 ENCOUNTER — Ambulatory Visit: Payer: Self-pay | Admitting: Physician Assistant

## 2016-07-01 VITALS — BP 122/82 | Temp 89.0°F | Wt 193.0 lb

## 2016-07-01 DIAGNOSIS — J45909 Unspecified asthma, uncomplicated: Secondary | ICD-10-CM

## 2016-07-01 DIAGNOSIS — R7303 Prediabetes: Secondary | ICD-10-CM

## 2016-07-01 DIAGNOSIS — E669 Obesity, unspecified: Secondary | ICD-10-CM

## 2016-07-01 DIAGNOSIS — Z7901 Long term (current) use of anticoagulants: Secondary | ICD-10-CM

## 2016-07-01 DIAGNOSIS — Z1239 Encounter for other screening for malignant neoplasm of breast: Secondary | ICD-10-CM

## 2016-07-01 DIAGNOSIS — E785 Hyperlipidemia, unspecified: Secondary | ICD-10-CM | POA: Insufficient documentation

## 2016-07-01 HISTORY — DX: Prediabetes: R73.03

## 2016-07-01 MED ORDER — LOVASTATIN 20 MG PO TABS: 20.0000 mg | ORAL_TABLET | Freq: Every day | ORAL | 4 refills | Status: DC

## 2016-07-01 NOTE — Progress Notes (Signed)
BP 122/82   Temp (!) 89 F (31.7 C)   Wt 193 lb (87.5 kg)   LMP 06/25/2016   SpO2 97%   BMI 30.23 kg/m    Subjective:    Patient ID: Melanie Mendoza, female    DOB: 1971-04-16, 45 y.o.   MRN: [id number]  HPI: Melanie Mendoza is a 45 y.o. female presenting on 07/01/2016 for No chief complaint on file.   HPI   Pt missed f/u in august.  Her coumadin is managed by coumadin clinic- hx PE   Pt states breathing improved since recent visit to ER  She has f/u with oncology and pulmonology to determine if she needs to stay on anticoagulation.  She also has appointment with gastroenterology for her Ulcerative colitis.   Relevant past medical, surgical, family and social history reviewed and updated as indicated. Interim medical history since our last visit reviewed. Allergies and medications reviewed and updated.   Current Outpatient Prescriptions:  .  albuterol (PROVENTIL HFA;VENTOLIN HFA) 108 (90 Base) MCG/ACT inhaler, Inhale 2 puffs into the lungs every 4 (four) hours as needed for shortness of breath., Disp: 1 Inhaler, Rfl: 3 .  albuterol (PROVENTIL) (2.5 MG/3ML) 0.083% nebulizer solution, Take 3 mLs (2.5 mg total) by nebulization every 6 (six) hours as needed for wheezing or shortness of breath., Disp: 75 mL, Rfl: 12 .  sulfaSALAzine (AZULFIDINE) 500 MG tablet, Take 500 mg by mouth 2 (two) times daily. , Disp: , Rfl:  .  warfarin (COUMADIN) 5 MG tablet, Take 1 tablet (5 mg total) by mouth daily., Disp: 30 tablet, Rfl: 0 .  lovastatin (MEVACOR) 20 MG tablet, Take 1 tablet (20 mg total) by mouth at bedtime., Disp: 30 tablet, Rfl: 4   Review of Systems  Constitutional: Negative for appetite change, chills, diaphoresis, fatigue, fever and unexpected weight change.  HENT: Negative for congestion, drooling, ear pain, facial swelling, hearing loss, mouth sores, sneezing, sore throat, trouble swallowing and voice change.   Eyes: Negative for pain, discharge, redness, itching and visual  disturbance.  Respiratory: Negative for cough, choking, shortness of breath and wheezing.   Cardiovascular: Negative for chest pain, palpitations and leg swelling.  Gastrointestinal: Negative for abdominal pain, blood in stool, constipation, diarrhea and vomiting.  Endocrine: Negative for cold intolerance, heat intolerance and polydipsia.  Genitourinary: Negative for decreased urine volume, dysuria and hematuria.  Musculoskeletal: Negative for arthralgias, back pain and gait problem.  Skin: Negative for rash.  Allergic/Immunologic: Negative for environmental allergies.  Neurological: Negative for seizures, syncope, light-headedness and headaches.  Hematological: Negative for adenopathy.  Psychiatric/Behavioral: Negative for agitation, dysphoric mood and suicidal ideas. The patient is not nervous/anxious.     Per HPI unless specifically indicated above     Objective:    BP 122/82   Temp (!) 89 F (31.7 C)   Wt 193 lb (87.5 kg)   LMP 06/25/2016   SpO2 97%   BMI 30.23 kg/m   Wt Readings from Last 3 Encounters:  07/01/16 193 lb (87.5 kg)  06/25/16 195 lb (88.5 kg)  05/06/16 193 lb (87.5 kg)    Physical Exam  Constitutional: She is oriented to person, place, and time. She appears well-developed and well-nourished.  HENT:  Head: Normocephalic and atraumatic.  Neck: Neck supple.  Cardiovascular: Normal rate and regular rhythm.   Pulmonary/Chest: Effort normal and breath sounds normal.  Abdominal: Soft. Bowel sounds are normal. She exhibits no mass. There is no hepatosplenomegaly. There is no tenderness.  Musculoskeletal: She  exhibits no edema.  Lymphadenopathy:    She has no cervical adenopathy.  Neurological: She is alert and oriented to person, place, and time.  Skin: Skin is warm and dry.  Psychiatric: She has a normal mood and affect. Her behavior is normal.  Vitals reviewed.       Assessment & Plan:   Encounter Diagnoses  Name Primary?  Marland Kitchen. Asthma, unspecified  asthma severity, uncomplicated Yes  . Hyperlipidemia   . Prediabetes   . Long term (current) use of anticoagulants [Z79.01]   . Obesity, unspecified   . Screening for breast cancer      -reviewed labs with pt -mammogram after thanksgiving -Counseled pt on prediabetes- gave handout.  -Counseled on lowfat diet and gave handout.  work on diet and exercise.  rx lovastatin -pt Asked about menopausal symptoms- discussed that she is not candidate for hormones.  Discussed option of ssri therapy if wanted.  She says she would think about it -f/u 3 months to check cholesterol.  RTO sooner prn

## 2016-07-01 NOTE — Patient Instructions (Signed)
Fat and Cholesterol Restricted Diet High levels of fat and cholesterol in your blood may lead to various health problems, such as diseases of the heart, blood vessels, gallbladder, liver, and pancreas. Fats are concentrated sources of energy that come in various forms. Certain types of fat, including saturated fat, may be harmful in excess. Cholesterol is a substance needed by your body in small amounts. Your body makes all the cholesterol it needs. Excess cholesterol comes from the food you eat. When you have high levels of cholesterol and saturated fat in your blood, health problems can develop because the excess fat and cholesterol will gather along the walls of your blood vessels, causing them to narrow. Choosing the right foods will help you control your intake of fat and cholesterol. This will help keep the levels of these substances in your blood within normal limits and reduce your risk of disease. WHAT IS MY PLAN? Your health care provider recommends that you:  Get no more than __________ % of the total calories in your daily diet from fat.  Limit your intake of saturated fat to less than ______% of your total calories each day.  Limit the amount of cholesterol in your diet to less than _________mg per day. WHAT TYPES OF FAT SHOULD I CHOOSE?  Choose healthy fats more often. Choose monounsaturated and polyunsaturated fats, such as olive and canola oil, flaxseeds, walnuts, almonds, and seeds.  Eat more omega-3 fats. Good choices include salmon, mackerel, sardines, tuna, flaxseed oil, and ground flaxseeds. Aim to eat fish at least two times a week.  Limit saturated fats. Saturated fats are primarily found in animal products, such as meats, butter, and cream. Plant sources of saturated fats include palm oil, palm kernel oil, and coconut oil.  Avoid foods with partially hydrogenated oils in them. These contain trans fats. Examples of foods that contain trans fats are stick margarine, some tub  margarines, cookies, crackers, and other baked goods. WHAT GENERAL GUIDELINES DO I NEED TO FOLLOW? These guidelines for healthy eating will help you control your intake of fat and cholesterol:  Check food labels carefully to identify foods with trans fats or high amounts of saturated fat.  Fill one half of your plate with vegetables and green salads.  Fill one fourth of your plate with whole grains. Look for the word "whole" as the first word in the ingredient list.  Fill one fourth of your plate with lean protein foods.  Limit fruit to two servings a day. Choose fruit instead of juice.  Eat more foods that contain soluble fiber. Examples of foods that contain this type of fiber are apples, broccoli, carrots, beans, peas, and barley. Aim to get 20-30 g of fiber per day.  Eat more home-cooked food and less restaurant, buffet, and fast food.  Limit or avoid alcohol.  Limit foods high in starch and sugar.  Limit fried foods.  Cook foods using methods other than frying. Baking, boiling, grilling, and broiling are all great options.  Lose weight if you are overweight. Losing just 5-10% of your initial body weight can help your overall health and prevent diseases such as diabetes and heart disease. WHAT FOODS CAN I EAT? Grains Whole grains, such as whole wheat or whole grain breads, crackers, cereals, and pasta. Unsweetened oatmeal, bulgur, barley, quinoa, or brown rice. Corn or whole wheat flour tortillas. Vegetables Fresh or frozen vegetables (raw, steamed, roasted, or grilled). Green salads. Fruits All fresh, canned (in natural juice), or frozen fruits. Meat and  Other Protein Products Ground beef (85% or leaner), grass-fed beef, or beef trimmed of fat. Skinless chicken or [place]. Ground chicken or [place]. Pork trimmed of fat. All fish and seafood. Eggs. Dried beans, peas, or lentils. Unsalted nuts or seeds. Unsalted canned or dry beans. Dairy Low-fat dairy products, such as skim or  1% milk, 2% or reduced-fat cheeses, low-fat ricotta or cottage cheese, or plain low-fat yogurt. Fats and Oils Tub margarines without trans fats. Light or reduced-fat mayonnaise and salad dressings. Avocado. Olive, canola, sesame, or safflower oils. Natural peanut or almond butter (choose ones without added sugar and oil). The items listed above may not be a complete list of recommended foods or beverages. Contact your dietitian for more options. WHAT FOODS ARE NOT RECOMMENDED? Grains White bread. White pasta. White rice. Cornbread. Bagels, pastries, and croissants. Crackers that contain trans fat. Vegetables White potatoes. Corn. Creamed or fried vegetables. Vegetables in a cheese sauce. Fruits Dried fruits. Canned fruit in light or heavy syrup. Fruit juice. Meat and Other Protein Products Fatty cuts of meat. Ribs, chicken wings, bacon, sausage, bologna, salami, chitterlings, fatback, hot dogs, bratwurst, and packaged luncheon meats. Liver and organ meats. Dairy Whole or 2% milk, cream, half-and-half, and cream cheese. Whole milk cheeses. Whole-fat or sweetened yogurt. Full-fat cheeses. Nondairy creamers and whipped toppings. Processed cheese, cheese spreads, or cheese curds. Sweets and Desserts Corn syrup, sugars, honey, and molasses. Candy. Jam and jelly. Syrup. Sweetened cereals. Cookies, pies, cakes, donuts, muffins, and ice cream. Fats and Oils Butter, stick margarine, lard, shortening, ghee, or bacon fat. Coconut, palm kernel, or palm oils. Beverages Alcohol. Sweetened drinks (such as sodas, lemonade, and fruit drinks or punches). The items listed above may not be a complete list of foods and beverages to avoid. Contact your dietitian for more information.   This information is not intended to replace advice given to you by your health care provider. Make sure you discuss any questions you have with your health care provider.   Document Released: 09/30/2005 Document Revised: 10/21/2014  Document Reviewed: 12/29/2013 Elsevier Interactive Patient Education 2016 [employer].  Prediabetes Eating Plan Prediabetes--also called impaired glucose tolerance or impaired fasting glucose--is a condition that causes blood sugar (blood glucose) levels to be higher than normal. Following a healthy diet can help to keep prediabetes under control. It can also help to lower the risk of type 2 diabetes and heart disease, which are increased in people who have prediabetes. Along with regular exercise, a healthy diet:  Promotes weight loss.  Helps to control blood sugar levels.  Helps to improve the way that the body uses insulin. WHAT DO I NEED TO KNOW ABOUT THIS EATING PLAN?  Use the glycemic index (GI) to plan your meals. The index tells you how quickly a food will raise your blood sugar. Choose low-GI foods. These foods take a longer time to raise blood sugar.  Pay close attention to the amount of carbohydrates in the food that you eat. Carbohydrates increase blood sugar levels.  Keep track of how many calories you take in. Eating the right amount of calories will help you to achieve a healthy weight. Losing about 7 percent of your starting weight can help to prevent type 2 diabetes.  You may want to follow a Mediterranean diet. This diet includes a lot of vegetables, lean meats or fish, whole grains, fruits, and healthy oils and fats. WHAT FOODS CAN I EAT? Grains Whole grains, such as whole-wheat or whole-grain breads, crackers, cereals, and pasta.  Unsweetened oatmeal. Bulgur. Barley. Quinoa. Brown rice. Corn or whole-wheat flour tortillas or taco shells. Vegetables Lettuce. Spinach. Peas. Beets. Cauliflower. Cabbage. Broccoli. Carrots. Tomatoes. Squash. Eggplant. Herbs. Peppers. Onions. Cucumbers. Brussels sprouts. Fruits Berries. Bananas. Apples. Oranges. Grapes. Papaya. Mango. Pomegranate. Kiwi. Grapefruit. Cherries. Meats and Other Protein Sources Seafood. Lean meats, such as  chicken and [place] or lean cuts of pork and beef. Tofu. Eggs. Nuts. Beans. Dairy Low-fat or fat-free dairy products, such as yogurt, cottage cheese, and cheese. Beverages Water. Tea. Coffee. Sugar-free or diet soda. Seltzer water. Milk. Milk alternatives, such as soy or almond milk. Condiments Mustard. Relish. Low-fat, low-sugar ketchup. Low-fat, low-sugar barbecue sauce. Low-fat or fat-free mayonnaise. Sweets and Desserts Sugar-free or low-fat pudding. Sugar-free or low-fat ice cream and other frozen treats. Fats and Oils Avocado. Walnuts. Olive oil. The items listed above may not be a complete list of recommended foods or beverages. Contact your dietitian for more options.  WHAT FOODS ARE NOT RECOMMENDED? Grains Refined white flour and flour products, such as bread, pasta, snack foods, and cereals. Beverages Sweetened drinks, such as sweet iced tea and soda. Sweets and Desserts Baked goods, such as cake, cupcakes, pastries, cookies, and cheesecake. The items listed above may not be a complete list of foods and beverages to avoid. Contact your dietitian for more information.   This information is not intended to replace advice given to you by your health care provider. Make sure you discuss any questions you have with your health care provider.   Document Released: 02/14/2015 Document Reviewed: 02/14/2015 Elsevier Interactive Patient Education [employer]2016 Elsevier Inc.

## 2016-07-03 ENCOUNTER — Ambulatory Visit (INDEPENDENT_AMBULATORY_CARE_PROVIDER_SITE_OTHER): Payer: Self-pay | Admitting: *Deleted

## 2016-07-03 ENCOUNTER — Encounter (HOSPITAL_COMMUNITY): Payer: Self-pay

## 2016-07-03 ENCOUNTER — Observation Stay (HOSPITAL_COMMUNITY)
Admission: EM | Admit: 2016-07-03 | Discharge: 2016-07-04 | Disposition: A | Discharge status: Home / Self Care | Payer: Self-pay | Attending: Emergency Medicine | Admitting: Emergency Medicine

## 2016-07-03 ENCOUNTER — Emergency Department (HOSPITAL_COMMUNITY): Payer: Self-pay

## 2016-07-03 DIAGNOSIS — I82499 Acute embolism and thrombosis of other specified deep vein of unspecified lower extremity: Secondary | ICD-10-CM

## 2016-07-03 DIAGNOSIS — Z79899 Other long term (current) drug therapy: Secondary | ICD-10-CM | POA: Insufficient documentation

## 2016-07-03 DIAGNOSIS — J45901 Unspecified asthma with (acute) exacerbation: Principal | ICD-10-CM | POA: Insufficient documentation

## 2016-07-03 DIAGNOSIS — I2699 Other pulmonary embolism without acute cor pulmonale: Secondary | ICD-10-CM

## 2016-07-03 DIAGNOSIS — Z7901 Long term (current) use of anticoagulants: Secondary | ICD-10-CM

## 2016-07-03 LAB — CBC WITH DIFFERENTIAL/PLATELET
Basophils Absolute: 0.1 10*3/uL (ref 0.0–0.1)
Basophils Relative: 0 %
Eosinophils Absolute: 0.7 10*3/uL (ref 0.0–0.7)
Eosinophils Relative: 5 %
HCT: 41.2 % (ref 36.0–46.0)
HEMOGLOBIN: 13.5 g/dL (ref 12.0–15.0)
LYMPHS ABS: 3 10*3/uL (ref 0.7–4.0)
Lymphocytes Relative: 24 %
MCH: 29.3 pg (ref 26.0–34.0)
MCHC: 32.8 g/dL (ref 30.0–36.0)
MCV: 89.6 fL (ref 78.0–100.0)
MONOS PCT: 7 %
Monocytes Absolute: 0.9 10*3/uL (ref 0.1–1.0)
NEUTROS ABS: 8 10*3/uL — AB (ref 1.7–7.7)
NEUTROS PCT: 64 %
Platelets: 453 10*3/uL — ABNORMAL HIGH (ref 150–400)
RBC: 4.6 MIL/uL (ref 3.87–5.11)
RDW: 13.4 % (ref 11.5–15.5)
WBC: 12.6 10*3/uL — ABNORMAL HIGH (ref 4.0–10.5)

## 2016-07-03 LAB — COMPREHENSIVE METABOLIC PANEL
ALK PHOS: 63 U/L (ref 38–126)
ALT: 25 U/L (ref 14–54)
AST: 21 U/L (ref 15–41)
Albumin: 4 g/dL (ref 3.5–5.0)
Anion gap: 6 (ref 5–15)
BUN: 10 mg/dL (ref 6–20)
CO2: 30 mmol/L (ref 22–32)
CREATININE: 0.67 mg/dL (ref 0.44–1.00)
Calcium: 9.1 mg/dL (ref 8.9–10.3)
Chloride: 102 mmol/L (ref 101–111)
GFR calc non Af Amer: >60 mL/min (ref >60)
Glucose, Bld: 148 mg/dL — ABNORMAL HIGH (ref 65–99)
Potassium: 3.7 mmol/L (ref 3.5–5.1)
SODIUM: 138 mmol/L (ref 135–145)
Total Bilirubin: 0.4 mg/dL (ref 0.3–1.2)
Total Protein: 7.1 g/dL (ref 6.5–8.1)

## 2016-07-03 LAB — TROPONIN I: Troponin I: <0.03 ng/mL (ref <0.03)

## 2016-07-03 LAB — POC URINE PREG, ED: Preg Test, Ur: NEGATIVE

## 2016-07-03 LAB — POCT INR: INR: 2

## 2016-07-03 MED ORDER — ALBUTEROL SULFATE (2.5 MG/3ML) 0.083% IN NEBU
2.5000 mg | INHALATION_SOLUTION | Freq: Once | RESPIRATORY_TRACT | Status: AC
  Administered 2016-07-03: 2.5 mg via RESPIRATORY_TRACT
  Filled 2016-07-03: qty 3

## 2016-07-03 MED ORDER — METHYLPREDNISOLONE SODIUM SUCC 125 MG IJ SOLR
125.0000 mg | Freq: Once | INTRAMUSCULAR | Status: AC
Start: 2016-07-03 — End: 2016-07-03
  Administered 2016-07-03: 125 mg via INTRAVENOUS
  Filled 2016-07-03: qty 2

## 2016-07-03 MED ORDER — ALBUTEROL (5 MG/ML) CONTINUOUS INHALATION SOLN
INHALATION_SOLUTION | RESPIRATORY_TRACT | Status: AC
  Administered 2016-07-03: 10 mg/h via RESPIRATORY_TRACT
  Filled 2016-07-03: qty 20

## 2016-07-03 MED ORDER — IOPAMIDOL (ISOVUE-370) INJECTION 76%
100.0000 mL | Freq: Once | INTRAVENOUS | Status: AC | PRN
  Administered 2016-07-03: 100 mL via INTRAVENOUS

## 2016-07-03 MED ORDER — SODIUM CHLORIDE 0.9 % IV BOLUS (SEPSIS)
1000.0000 mL | Freq: Once | INTRAVENOUS | Status: AC
  Administered 2016-07-03: 1000 mL via INTRAVENOUS

## 2016-07-03 MED ORDER — ALBUTEROL (5 MG/ML) CONTINUOUS INHALATION SOLN
10.0000 mg/h | INHALATION_SOLUTION | RESPIRATORY_TRACT | Status: DC
  Administered 2016-07-03: 10 mg/h via RESPIRATORY_TRACT

## 2016-07-03 MED ORDER — WARFARIN SODIUM 5 MG PO TABS: 5.0000 mg | ORAL_TABLET | Freq: Every day | ORAL | 1 refills | Status: DC

## 2016-07-03 MED ORDER — IPRATROPIUM-ALBUTEROL 0.5-2.5 (3) MG/3ML IN SOLN
RESPIRATORY_TRACT | Status: AC
  Filled 2016-07-03: qty 3

## 2016-07-03 MED ORDER — ALPRAZOLAM 0.5 MG PO TABS
0.5000 mg | ORAL_TABLET | Freq: Once | ORAL | Status: AC
  Administered 2016-07-03: 0.5 mg via ORAL
  Filled 2016-07-03: qty 1

## 2016-07-03 MED ORDER — IPRATROPIUM-ALBUTEROL 0.5-2.5 (3) MG/3ML IN SOLN
3.0000 mL | Freq: Once | RESPIRATORY_TRACT | Status: AC
  Administered 2016-07-03: 3 mL via RESPIRATORY_TRACT
  Filled 2016-07-03: qty 3

## 2016-07-03 MED ORDER — IPRATROPIUM BROMIDE 0.02 % IN SOLN
0.5000 mg | Freq: Once | RESPIRATORY_TRACT | Status: AC
  Administered 2016-07-03: 0.5 mg via RESPIRATORY_TRACT
  Filled 2016-07-03: qty 2.5

## 2016-07-03 NOTE — H&P (Signed)
TRH H&P   Patient Demographics:    Melanie Mendoza, is a 45 y.o. female  MRN: [id number]  DOB - 16-May-1971  Admit Date - 07/03/2016  Outpatient Primary MD for the patient is Jacquelin Hawking, PA-C  Referring MD/NP/PA: Dr. Erin Hearing  Patient coming from: Home  Chief Complaint  Patient presents with  . Shortness of Breath      HPI:    Melanie Mendoza  is a 45 y.o. female, With history of asthma, pulmonary embolism on anticoagulation with warfarin, INR 2.0, who came to the hospital with worsening shortness of breath for 2 days. Patient said that she tried nebulizer treatments at home which did not help. She denies any fever, not coughing up any phlegm. In the ED CT angiogram of the chest was done which was negative for PE.     Review of systems:    In addition to the HPI above,  No Fever-chills, No Headache, No changes with Vision or hearing, No Abdominal pain, No Nausea or Vomiting, bowel movements are regular, No dysuria,   A full 10 point Review of Systems was done, except as stated above, all other Review of Systems were negative.   With Past History of the following :    Past Medical History:  Diagnosis Date  . Asthma   . Bilateral ovarian cysts   . PE (pulmonary embolism) 09/2013  . Polysubstance abuse   . Ulcerative colitis (HCC)       History reviewed. No pertinent surgical history.    Social History:     Social History  Substance Use Topics  . Smoking status: Never Smoker  . Smokeless tobacco: Never Used  . Alcohol use No        Family History :     Family History  Problem Relation Age of Onset  . Lung cancer Mother   . Cancer Mother       Home Medications:   Prior to Admission medications   Medication Sig Start Date End Date Taking? Authorizing Provider  acetaminophen (TYLENOL) 500 MG tablet Take 1,000 mg by mouth every 6 (six) hours as needed for moderate pain.    Yes Historical Provider, MD  albuterol (PROVENTIL HFA;VENTOLIN HFA) 108 (90 Base) MCG/ACT inhaler Inhale 2 puffs into the lungs every 4 (four) hours as needed for shortness of breath. 05/06/16  Yes Antoine Poche, MD  albuterol (PROVENTIL) (2.5 MG/3ML) 0.083% nebulizer solution Take 3 mLs (2.5 mg total) by nebulization every 6 (six) hours as needed for wheezing or shortness of breath. Patient taking differently: Take 2.5 mg by nebulization 3 (three) times daily.  05/06/16  Yes Antoine Poche, MD  sulfaSALAzine (AZULFIDINE) 500 MG tablet Take 500 mg by mouth 2 (two) times daily.    Yes Historical Provider, MD  warfarin (COUMADIN) 5 MG tablet Take 1 tablet (5 mg total) by mouth daily. 07/03/16  Yes Antoine Poche, MD  lovastatin (MEVACOR) 20 MG tablet Take 1 tablet (20 mg total) by mouth at bedtime. 07/01/16  Jacquelin HawkingShannon McElroy, PA-C     Allergies:    No Known Allergies   Physical Exam:   Vitals  Blood pressure 145/90, pulse (!) 121, temperature 98 F (36.7 C), temperature source Oral, last menstrual period 06/25/2016, SpO2 99 %.  1.  General Caucasian female in mild respiratory distress  2. Psychiatric: Intact judgement and  insight, awake alert, oriented x 3.  3. Neurologic:No focal neurological deficits, all cranial nerves intact. Strength 5/5 all 4 extremities, sensation intact all 4 extremities, plantars down going.  4. Eyes show anicteric sclerae, moist conjunctivae with no lid lag. PERRLA.  5. ENMT: Oropharynx clear with moist mucous membranes and good dentition  5. Neck: supple, no cervical lymphadenopathy appriciated, No thyromegaly  6. Respiratory :Normal respiratory effort, diffuse wheezing bilaterally  7. Cardiovascular :RRR, no gallops, rubs or murmurs, trace edema of the lower extremities  8. GI: Positive bowel sounds, abdomen soft, no tenderness to palpation, no organomegaly appriciated,no rebound -guarding or rigidity.  9. Skin: No cyanosis, normal texture and  turgor, no rash, lesions or ulcers  10.Musculoskeletal: Good muscle tone,  joints appear normal , no effusions, normal range of motion         Data Review:    CBC  Recent Labs Lab 07/03/16 1930  WBC 12.6*  HGB 13.5  HCT 41.2  PLT 453*  MCV 89.6  MCH 29.3  MCHC 32.8  RDW 13.4  LYMPHSABS 3.0  MONOABS 0.9  EOSABS 0.7  BASOSABS 0.1   ------------------------------------------------------------------------------------------------------------------  Chemistries   Recent Labs Lab 07/03/16 1930  NA 138  K 3.7  CL 102  CO2 30  GLUCOSE 148*  BUN 10  CREATININE 0.67  CALCIUM 9.1  AST 21  ALT 25  ALKPHOS 63  BILITOT 0.4   ------------------------------------------------------------------------------------------------------------------  ------------------------------------------------------------------------------------------------------------------ GFR: Estimated Creatinine Clearance: 99.9 mL/min (by C-G formula based on SCr of 0.67 mg/dL). Liver Function Tests:  Recent Labs Lab 07/03/16 1930  AST 21  ALT 25  ALKPHOS 63  BILITOT 0.4  PROT 7.1  ALBUMIN 4.0   No results for input(s): LIPASE, AMYLASE in the last 168 hours. No results for input(s): AMMONIA in the last 168 hours. Coagulation Profile:  Recent Labs Lab 07/03/16 1622  INR 2.0   Cardiac Enzymes:  Recent Labs Lab 07/03/16 1930  TROPONINI <0.03    --------------------------------------------------------------------------------------------------------------- Urine analysis:    Component Value Date/Time   COLORURINE YELLOW 09/09/2013 0036   APPEARANCEUR CLEAR 09/09/2013 0036   LABSPEC 1.010 09/09/2013 0036   PHURINE 5.5 09/09/2013 0036   GLUCOSEU >1000 (A) 09/09/2013 0036   HGBUR NEGATIVE 09/09/2013 0036   BILIRUBINUR NEGATIVE 09/09/2013 0036   KETONESUR NEGATIVE 09/09/2013 0036   PROTEINUR NEGATIVE 09/09/2013 0036   UROBILINOGEN 0.2 09/09/2013 0036   NITRITE NEGATIVE  09/09/2013 0036   LEUKOCYTESUR NEGATIVE 09/09/2013 0036      ----------------------------------------------------------------------------------------------------------------   Imaging Results:    Ct Angio Chest Pe W And/or Wo Contrast  Result Date: 07/03/2016 CLINICAL DATA:  Wheezing and shortness of breath tonight, similar symptoms for week, worsening now. History of pulmonary embolism, polysubstance abuse, ulcerative colitis. EXAM: CT ANGIOGRAPHY CHEST WITH CONTRAST TECHNIQUE: Multidetector CT imaging of the chest was performed using the standard protocol during bolus administration of intravenous contrast. Multiplanar CT image reconstructions and MIPs were obtained to evaluate the vascular anatomy. CONTRAST:  100 cc Isovue 370 COMPARISON:  Chest radiograph July 03, 2016 at 1929 hours and CT chest September 10, 2013 FINDINGS: CARDIOVASCULAR: Moderate respiratory motion degraded examination. Streak artifact limits  assessment for upper lobe pulmonary emboli. Adequate contrast opacification of the pulmonary artery's. Main pulmonary artery is not enlarged. No pulmonary arterial filling defects to the level of the subsegmental branches. Heart size is normal, no right heart strain. No pericardial effusions. Thoracic aorta is normal course and caliber, unremarkable. MEDIASTINUM/NODES: No lymphadenopathy by CT size criteria. Sub cm mediastinal lymph nodes are likely reactive, similar to prior CT. LUNGS/PLEURA: Tracheobronchial tree is patent, no pneumothorax. No pleural effusions, focal consolidations, pulmonary nodules or masses. UPPER ABDOMEN: Included view of the abdomen is unremarkable. MUSCULOSKELETAL: Presume motion artifact through the sternum. Visualized soft tissues and included osseous structures appear nonacute. Loose body RIGHT shoulder. Review of the MIP images confirms the above findings. IMPRESSION: No acute pulmonary embolism or acute cardiopulmonary process on this moderately respiratory  motion degraded examination. Person motion artifact to the sternum, recommend correlation point tenderness. Electronically Signed   By: Awilda Metro M.D.   On: 07/03/2016 21:48   Dg Chest Portable 1 View  Result Date: 07/03/2016 CLINICAL DATA:  Acute onset of shortness of breath and wheezing. Initial encounter. EXAM: PORTABLE CHEST 1 VIEW COMPARISON:  Chest radiograph performed 06/25/2016 FINDINGS: The lungs are well-aerated. Mild vascular congestion is noted. There is no evidence of focal opacification, pleural effusion or pneumothorax. The cardiomediastinal silhouette is within normal limits. No acute osseous abnormalities are seen. IMPRESSION: Mild vascular congestion noted.  Lungs remain grossly clear. Electronically Signed   By: Roanna Raider M.D.   On: 07/03/2016 20:09       Assessment & Plan:    Active Problems:   Asthma exacerbation   1. Asthma exacerbation- place under observation, start Xopenex nebulizers every 6 hours (due to anxiety/palpitations/tachycardia), ipratropium embolism every 6 hours, Solu-Medrol 60 mg IV every 6 hours. 2. Bilateral lower extremity edema- we'll check echocardiogram to rule out underlying pulmonary hypertension, right heart failure. Chest x-ray showed mild vascular congestion. Check BNP 3. History of pulmonary embolism- CTA chest negative, continue warfarin per pharmacy consultation. INR is therapeutic at 2.0 4. History of ulcerative colitis- stable, continue sulfasalazine.   DVT Prophylaxis-  patient on warfarin  AM Labs Ordered, also please review Full Orders  Family Communication: No family at bedside  Code Status:  Full code  Admission status: Observation    Time spent in minutes : 60 minutes   Jama Krichbaum S M.D on 07/03/2016 at 11:26 PM  Between 7am to 7pm - Pager - [phone]. After 7pm go to [email or web address] - password [facility]  [facility] - Office  [phone]

## 2016-07-03 NOTE — ED Notes (Signed)
Patient transported to CT 

## 2016-07-03 NOTE — ED Triage Notes (Signed)
Wheezing and sob worsening tonight.  Pt states she was here a week ago for same but tonight is worse.

## 2016-07-03 NOTE — ED Provider Notes (Signed)
[facility]-EMERGENCY DEPT Provider Note   CSN: [id number] Arrival date & time: 07/03/16  1478     History   Chief Complaint Chief Complaint  Patient presents with  . Shortness of Breath    HPI Melanie Mendoza is a 45 y.o. female.   Shortness of Breath  This is a recurrent problem. The average episode lasts 1 day. The problem occurs continuously.The problem has been gradually worsening. Associated symptoms include a fever, sore throat, cough, sputum production and wheezing. Pertinent negatives include no leg swelling and no claudication. She has tried nothing for the symptoms. The treatment provided no relief. She has had prior hospitalizations. She has had prior ED visits. She has had no prior ICU admissions.    Past Medical History:  Diagnosis Date  . Asthma   . Bilateral ovarian cysts   . PE (pulmonary embolism) 09/2013  . Polysubstance abuse   . Ulcerative colitis [facility])     Patient Active Problem List   Diagnosis Date Noted  . Asthma exacerbation 07/03/2016  . Hyperlipidemia 07/01/2016  . Prediabetes 07/01/2016  . Long term (current) use of anticoagulants [Z79.01] 05/08/2016  . On anticoagulant therapy 04/16/2016  . Obesity, unspecified 04/16/2016  . Manipulative behavior 09/13/2013  . Acute pulmonary embolism (HCC) 09/13/2013  . Asthma with bronchitis 09/08/2013  . Polysubstance abuse 09/08/2013  . Narcotic abuse 09/08/2013  . Hypoxia 09/08/2013  . DVT of leg (deep venous thrombosis) (HCC) 09/08/2013  . Anemia 09/08/2013  . Overdose of benzodiazepine 08/20/2013  . Acute encephalopathy 08/20/2013  . Acute respiratory failure (HCC) 08/20/2013  . Asthma 08/20/2013  . Leukocytosis 08/20/2013    History reviewed. No pertinent surgical history.  OB History    Gravida Para Term Preterm AB Living             2   SAB TAB Ectopic Multiple Live Births                   Home Medications    Prior to Admission medications   Medication Sig Start Date End Date Taking?  Authorizing Provider  acetaminophen (TYLENOL) 500 MG tablet Take 1,000 mg by mouth every 6 (six) hours as needed for moderate pain.   Yes Historical Provider, MD  sulfaSALAzine (AZULFIDINE) 500 MG tablet Take 500 mg by mouth 2 (two) times daily.    Yes Historical Provider, MD  warfarin (COUMADIN) 5 MG tablet Take 1 tablet (5 mg total) by mouth daily. 07/03/16  Yes Antoine Poche, MD  albuterol (PROVENTIL HFA;VENTOLIN HFA) 108 (90 Base) MCG/ACT inhaler Inhale 2 puffs into the lungs every 4 (four) hours as needed for wheezing or shortness of breath. 07/04/16   Henderson Cloud, MD  albuterol (PROVENTIL) (2.5 MG/3ML) 0.083% nebulizer solution Take 3 mLs (2.5 mg total) by nebulization every 6 (six) hours as needed for wheezing or shortness of breath. 07/04/16   Henderson Cloud, MD  fluticasone (FLOVENT HFA) 110 MCG/ACT inhaler Inhale 2 puffs into the lungs daily. 07/04/16   Henderson Cloud, MD  lovastatin (MEVACOR) 20 MG tablet Take 1 tablet (20 mg total) by mouth at bedtime. 07/01/16   Jacquelin Hawking, PA-C  predniSONE (DELTASONE) 10 MG tablet Take 1 tablet (10 mg total) by mouth daily with breakfast. 07/04/16   Henderson Cloud, MD    Family History Family History  Problem Relation Age of Onset  . Lung cancer Mother   . Cancer Mother     Social History Social  History  Substance Use Topics  . Smoking status: Never Smoker  . Smokeless tobacco: Never Used  . Alcohol use No     Allergies   Review of patient's allergies indicates no known allergies.   Review of Systems Review of Systems  Constitutional: Positive for fever.  HENT: Positive for sore throat.   Respiratory: Positive for cough, sputum production, shortness of breath and wheezing.   Cardiovascular: Negative for claudication and leg swelling.  All other systems reviewed and are negative.    Physical Exam Updated Vital Signs BP (!) 148/79 (BP Location: Left Arm)   Pulse (!) 110   Temp  98.4 F (36.9 C) (Oral)   Resp 18   Ht 5\' 7"  (1.702 m)   Wt 194 lb (88 kg)   LMP 06/25/2016   SpO2 90%   BMI 30.38 kg/m   Physical Exam  Constitutional: She is oriented to person, place, and time. She appears well-developed and well-nourished. No distress.  HENT:  Head: Normocephalic and atraumatic.  Eyes: Conjunctivae are normal.  Neck: Neck supple.  Cardiovascular: Normal rate and regular rhythm.   No murmur heard. Pulmonary/Chest: Effort normal and breath sounds normal. No respiratory distress.  Abdominal: Soft. There is no tenderness.  Musculoskeletal: She exhibits no edema.  Neurological: She is alert and oriented to person, place, and time.  Skin: Skin is warm and dry.  Psychiatric: She has a normal mood and affect.  Nursing note and vitals reviewed.    ED Treatments / Results  Labs (all labs ordered are listed, but only abnormal results are displayed) Labs Reviewed  CBC WITH DIFFERENTIAL/PLATELET - Abnormal; Notable for the following:       Result Value   WBC 12.6 (*)    Platelets 453 (*)    Neutro Abs 8.0 (*)    All other components within normal limits  COMPREHENSIVE METABOLIC PANEL - Abnormal; Notable for the following:    Glucose, Bld 148 (*)    All other components within normal limits  CBC - Abnormal; Notable for the following:    WBC 21.1 (*)    Platelets 452 (*)    All other components within normal limits  COMPREHENSIVE METABOLIC PANEL - Abnormal; Notable for the following:    Sodium 134 (*)    CO2 19 (*)    Glucose, Bld 262 (*)    Calcium 8.5 (*)    Total Bilirubin 0.2 (*)    All other components within normal limits  BRAIN NATRIURETIC PEPTIDE - Abnormal; Notable for the following:    B Natriuretic Peptide 288.0 (*)    All other components within normal limits  PROTIME-INR - Abnormal; Notable for the following:    Prothrombin Time 22.5 (*)    All other components within normal limits  TROPONIN I  BRAIN NATRIURETIC PEPTIDE  POC URINE PREG,  ED    EKG  EKG Interpretation None       Radiology Ct Angio Chest Pe W And/or Wo Contrast  Result Date: 07/03/2016 CLINICAL DATA:  Wheezing and shortness of breath tonight, similar symptoms for week, worsening now. History of pulmonary embolism, polysubstance abuse, ulcerative colitis. EXAM: CT ANGIOGRAPHY CHEST WITH CONTRAST TECHNIQUE: Multidetector CT imaging of the chest was performed using the standard protocol during bolus administration of intravenous contrast. Multiplanar CT image reconstructions and MIPs were obtained to evaluate the vascular anatomy. CONTRAST:  100 cc Isovue 370 COMPARISON:  Chest radiograph July 03, 2016 at 1929 hours and CT chest September 10, 2013 FINDINGS:  CARDIOVASCULAR: Moderate respiratory motion degraded examination. Streak artifact limits assessment for upper lobe pulmonary emboli. Adequate contrast opacification of the pulmonary artery's. Main pulmonary artery is not enlarged. No pulmonary arterial filling defects to the level of the subsegmental branches. Heart size is normal, no right heart strain. No pericardial effusions. Thoracic aorta is normal course and caliber, unremarkable. MEDIASTINUM/NODES: No lymphadenopathy by CT size criteria. Sub cm mediastinal lymph nodes are likely reactive, similar to prior CT. LUNGS/PLEURA: Tracheobronchial tree is patent, no pneumothorax. No pleural effusions, focal consolidations, pulmonary nodules or masses. UPPER ABDOMEN: Included view of the abdomen is unremarkable. MUSCULOSKELETAL: Presume motion artifact through the sternum. Visualized soft tissues and included osseous structures appear nonacute. Loose body RIGHT shoulder. Review of the MIP images confirms the above findings. IMPRESSION: No acute pulmonary embolism or acute cardiopulmonary process on this moderately respiratory motion degraded examination. Person motion artifact to the sternum, recommend correlation point tenderness. Electronically Signed   By: Awilda Metro M.D.   On: 07/03/2016 21:48   Dg Chest Portable 1 View  Result Date: 07/03/2016 CLINICAL DATA:  Acute onset of shortness of breath and wheezing. Initial encounter. EXAM: PORTABLE CHEST 1 VIEW COMPARISON:  Chest radiograph performed 06/25/2016 FINDINGS: The lungs are well-aerated. Mild vascular congestion is noted. There is no evidence of focal opacification, pleural effusion or pneumothorax. The cardiomediastinal silhouette is within normal limits. No acute osseous abnormalities are seen. IMPRESSION: Mild vascular congestion noted.  Lungs remain grossly clear. Electronically Signed   By: Roanna Raider M.D.   On: 07/03/2016 20:09    Procedures Procedures (including critical care time)  Medications Ordered in ED Medications  ipratropium-albuterol (DUONEB) 0.5-2.5 (3) MG/3ML nebulizer solution (not administered)  ipratropium-albuterol (DUONEB) 0.5-2.5 (3) MG/3ML nebulizer solution 3 mL (3 mLs Nebulization Given 07/03/16 1930)  albuterol (PROVENTIL) (2.5 MG/3ML) 0.083% nebulizer solution 2.5 mg (2.5 mg Nebulization Given 07/03/16 1931)  ipratropium (ATROVENT) nebulizer solution 0.5 mg (0.5 mg Nebulization Given 07/03/16 1949)  methylPREDNISolone sodium succinate (SOLU-MEDROL) 125 mg/2 mL injection 125 mg (125 mg Intravenous Given 07/03/16 1943)  sodium chloride 0.9 % bolus 1,000 mL (0 mLs Intravenous Stopped 07/03/16 2305)  iopamidol (ISOVUE-370) 76 % injection 100 mL (100 mLs Intravenous Contrast Given 07/03/16 2050)  sodium chloride 0.9 % bolus 1,000 mL (0 mLs Intravenous Stopped 07/04/16 0041)  ALPRAZolam (XANAX) tablet 0.5 mg (0.5 mg Oral Given 07/03/16 2319)     Initial Impression / Assessment and Plan / ED Course  I have reviewed the triage vital signs and the nursing notes.  Pertinent labs & imaging results that were available during my care of the patient were reviewed by me and considered in my medical decision making (see chart for details).  Clinical Course    Likely asthma  exacerbation requiring multiple continous albuterol treatments, steroids and still decreased breath sounds with exetional dyspnea. cta done 2/2 h/o PE and negative for new clot burden. Doubt ACS. No e/o pneumonia on CT. Plan for admission for further steroids/albuterol.   Final Clinical Impressions(s) / ED Diagnoses   Final diagnoses:  Asthma exacerbation    New Prescriptions Discharge Medication List as of 07/04/2016  1:05 PM    START taking these medications   Details  fluticasone (FLOVENT HFA) 110 MCG/ACT inhaler Inhale 2 puffs into the lungs daily., Starting Thu 07/04/2016, Print    predniSONE (DELTASONE) 10 MG tablet Take 1 tablet (10 mg total) by mouth daily with breakfast., Starting Thu 07/04/2016, Print         Carmine  Yumiko Alkins, MD 07/04/16 1630

## 2016-07-04 ENCOUNTER — Ambulatory Visit (HOSPITAL_COMMUNITY): Admission: RE | Admit: 2016-07-04 | Payer: Self-pay | Source: Ambulatory Visit

## 2016-07-04 DIAGNOSIS — J45901 Unspecified asthma with (acute) exacerbation: Secondary | ICD-10-CM

## 2016-07-04 LAB — COMPREHENSIVE METABOLIC PANEL
ALBUMIN: 3.9 g/dL (ref 3.5–5.0)
ALT: 28 U/L (ref 14–54)
AST: 35 U/L (ref 15–41)
Alkaline Phosphatase: 63 U/L (ref 38–126)
Anion gap: 10 (ref 5–15)
BUN: 9 mg/dL (ref 6–20)
CHLORIDE: 105 mmol/L (ref 101–111)
CO2: 19 mmol/L — AB (ref 22–32)
CREATININE: 0.67 mg/dL (ref 0.44–1.00)
Calcium: 8.5 mg/dL — ABNORMAL LOW (ref 8.9–10.3)
GFR calc Af Amer: >60 mL/min (ref >60)
GLUCOSE: 262 mg/dL — AB (ref 65–99)
Potassium: 4.5 mmol/L (ref 3.5–5.1)
Sodium: 134 mmol/L — ABNORMAL LOW (ref 135–145)
Total Bilirubin: 0.2 mg/dL — ABNORMAL LOW (ref 0.3–1.2)
Total Protein: 7.2 g/dL (ref 6.5–8.1)

## 2016-07-04 LAB — CBC
HCT: 40.8 % (ref 36.0–46.0)
Hemoglobin: 13.2 g/dL (ref 12.0–15.0)
MCH: 29.6 pg (ref 26.0–34.0)
MCHC: 32.4 g/dL (ref 30.0–36.0)
MCV: 91.5 fL (ref 78.0–100.0)
PLATELETS: 452 10*3/uL — AB (ref 150–400)
RBC: 4.46 MIL/uL (ref 3.87–5.11)
RDW: 13.8 % (ref 11.5–15.5)
WBC: 21.1 10*3/uL — AB (ref 4.0–10.5)

## 2016-07-04 LAB — BRAIN NATRIURETIC PEPTIDE
B NATRIURETIC PEPTIDE 5: 288 pg/mL — AB (ref 0.0–100.0)
B Natriuretic Peptide: 18 pg/mL (ref 0.0–100.0)

## 2016-07-04 LAB — PROTIME-INR
INR: 1.95
Prothrombin Time: 22.5 seconds — ABNORMAL HIGH (ref 11.4–15.2)

## 2016-07-04 MED ORDER — ACETAMINOPHEN 325 MG PO TABS
650.0000 mg | ORAL_TABLET | Freq: Four times a day (QID) | ORAL | Status: DC | PRN
  Administered 2016-07-04: 650 mg via ORAL
  Filled 2016-07-04: qty 2

## 2016-07-04 MED ORDER — ALUM & MAG HYDROXIDE-SIMETH 200-200-20 MG/5ML PO SUSP
30.0000 mL | ORAL | Status: DC | PRN
Start: 2016-07-04 — End: 2016-07-04
  Administered 2016-07-04: 30 mL via ORAL
  Filled 2016-07-04: qty 30

## 2016-07-04 MED ORDER — SULFASALAZINE 500 MG PO TABS
500.0000 mg | ORAL_TABLET | Freq: Two times a day (BID) | ORAL | Status: DC
  Administered 2016-07-04: 500 mg via ORAL
  Filled 2016-07-04 (×4): qty 1

## 2016-07-04 MED ORDER — ALBUTEROL SULFATE (2.5 MG/3ML) 0.083% IN NEBU: 2.5000 mg | INHALATION_SOLUTION | Freq: Four times a day (QID) | RESPIRATORY_TRACT | 12 refills | Status: DC | PRN

## 2016-07-04 MED ORDER — IPRATROPIUM BROMIDE 0.02 % IN SOLN
0.5000 mg | Freq: Four times a day (QID) | RESPIRATORY_TRACT | Status: DC
  Administered 2016-07-04 (×2): 0.5 mg via RESPIRATORY_TRACT
  Filled 2016-07-04 (×2): qty 2.5

## 2016-07-04 MED ORDER — ONDANSETRON HCL 4 MG PO TABS: 4.0000 mg | ORAL_TABLET | Freq: Four times a day (QID) | ORAL | Status: DC | PRN

## 2016-07-04 MED ORDER — LEVALBUTEROL HCL 0.63 MG/3ML IN NEBU
0.6300 mg | INHALATION_SOLUTION | Freq: Four times a day (QID) | RESPIRATORY_TRACT | Status: DC
  Administered 2016-07-04 (×2): 0.63 mg via RESPIRATORY_TRACT
  Filled 2016-07-04 (×2): qty 3

## 2016-07-04 MED ORDER — FLUTICASONE PROPIONATE HFA 110 MCG/ACT IN AERO: 2.0000 | INHALATION_SPRAY | Freq: Every day | RESPIRATORY_TRACT | 12 refills | Status: DC

## 2016-07-04 MED ORDER — ONDANSETRON HCL 4 MG/2ML IJ SOLN: 4.0000 mg | Freq: Four times a day (QID) | INTRAMUSCULAR | Status: DC | PRN

## 2016-07-04 MED ORDER — ALBUTEROL SULFATE HFA 108 (90 BASE) MCG/ACT IN AERS: 2.0000 | INHALATION_SPRAY | RESPIRATORY_TRACT | 3 refills | Status: DC | PRN

## 2016-07-04 MED ORDER — SODIUM CHLORIDE 0.9% FLUSH: 3.0000 mL | INTRAVENOUS | Status: DC | PRN

## 2016-07-04 MED ORDER — ZOLPIDEM TARTRATE 5 MG PO TABS
5.0000 mg | ORAL_TABLET | Freq: Every evening | ORAL | Status: DC | PRN
  Administered 2016-07-04: 5 mg via ORAL
  Filled 2016-07-04: qty 1

## 2016-07-04 MED ORDER — SODIUM CHLORIDE 0.9 % IV SOLN: 250.0000 mL | INTRAVENOUS | Status: DC | PRN

## 2016-07-04 MED ORDER — PRAVASTATIN SODIUM 10 MG PO TABS: 20.0000 mg | ORAL_TABLET | Freq: Every day | ORAL | Status: DC

## 2016-07-04 MED ORDER — WARFARIN - PHARMACIST DOSING INPATIENT: Status: DC

## 2016-07-04 MED ORDER — METHYLPREDNISOLONE SODIUM SUCC 125 MG IJ SOLR
60.0000 mg | Freq: Four times a day (QID) | INTRAMUSCULAR | Status: DC
  Administered 2016-07-04 (×2): 60 mg via INTRAVENOUS
  Filled 2016-07-04 (×2): qty 2

## 2016-07-04 MED ORDER — SODIUM CHLORIDE 0.9% FLUSH
3.0000 mL | Freq: Two times a day (BID) | INTRAVENOUS | Status: DC
  Administered 2016-07-04 (×2): 3 mL via INTRAVENOUS

## 2016-07-04 MED ORDER — WARFARIN SODIUM 5 MG PO TABS: 5.0000 mg | ORAL_TABLET | Freq: Once | ORAL | Status: DC

## 2016-07-04 MED ORDER — PREDNISONE 10 MG PO TABS: 10.0000 mg | ORAL_TABLET | Freq: Every day | ORAL | 0 refills | Status: DC

## 2016-07-04 NOTE — Progress Notes (Signed)
ANTICOAGULATION CONSULT NOTE - Initial Consult  Pharmacy Consult for Coumadin (chronic Rx PTA) Indication: pulmonary embolus  No Known Allergies  Patient Measurements: Height: 5\' 7"  (170.2 cm) Weight: 194 lb (88 kg) IBW/kg (Calculated) : 61.6  Vital Signs: Temp: 98.4 F (36.9 C) (09/21 0601) Temp Source: Oral (09/21 0601) BP: 148/79 (09/21 0601) Pulse Rate: 110 (09/21 0601)  Labs:  Recent Labs  07/03/16 1622 07/03/16 1930 07/04/16 0553 07/04/16 0744  HGB  --  13.5 13.2  --   HCT  --  41.2 40.8  --   PLT  --  453* 452*  --   LABPROT  --   --   --  22.5*  INR 2.0  --   --  1.95  CREATININE  --  0.67 0.67  --   TROPONINI  --  <0.03  --   --     Estimated Creatinine Clearance: 100.2 mL/min (by C-G formula based on SCr of 0.67 mg/dL).   Medical History: Past Medical History:  Diagnosis Date  . Asthma   . Bilateral ovarian cysts   . PE (pulmonary embolism) 09/2013  . Polysubstance abuse   . Ulcerative colitis (HCC)     Medications:  Prescriptions Prior to Admission  Medication Sig Dispense Refill Last Dose  . acetaminophen (TYLENOL) 500 MG tablet Take 1,000 mg by mouth every 6 (six) hours as needed for moderate pain.   07/02/2016 at Unknown time  . albuterol (PROVENTIL HFA;VENTOLIN HFA) 108 (90 Base) MCG/ACT inhaler Inhale 2 puffs into the lungs every 4 (four) hours as needed for shortness of breath. 1 Inhaler 3 07/03/2016 at Unknown time  . albuterol (PROVENTIL) (2.5 MG/3ML) 0.083% nebulizer solution Take 3 mLs (2.5 mg total) by nebulization every 6 (six) hours as needed for wheezing or shortness of breath. (Patient taking differently: Take 2.5 mg by nebulization 3 (three) times daily. ) 75 mL 12 07/03/2016 at Unknown time  . sulfaSALAzine (AZULFIDINE) 500 MG tablet Take 500 mg by mouth 2 (two) times daily.    Past Week at Unknown time  . warfarin (COUMADIN) 5 MG tablet Take 1 tablet (5 mg total) by mouth daily. 30 tablet 1 07/03/2016 at 0800  . lovastatin (MEVACOR) 20  MG tablet Take 1 tablet (20 mg total) by mouth at bedtime. 30 tablet 4     Assessment: 45 y.o. female, With history of asthma, pulmonary embolism on anticoagulation with warfarin, INR 2.0, who came to the hospital with worsening shortness of breath for 2 days. Patient said that she tried nebulizer treatments at home which did not help. She denies any fever, not coughing up any phlegm. In the ED CT angiogram of the chest was done which was negative for PE. Goal of Therapy:  INR 2-3 Monitor platelets by anticoagulation protocol: Yes   Plan:  Coumadin 5mg  today x 1 (home dose) Check INR daily Monitor for s/sx bleeding complications  Valrie HartHall, Deante Blough A 07/04/2016,8:27 AM

## 2016-07-04 NOTE — Progress Notes (Addendum)
Inpatient Diabetes Program Recommendations  AACE/ADA: New Consensus Statement on Inpatient Glycemic Control (2015)  Target Ranges:  Prepandial:   less than 140 mg/dL      Peak postprandial:   less than 180 mg/dL (1-2 hours)      Critically ill patients:  140 - 180 mg/dL   Results for Melanie Mendoza, Melanie Mendoza (MRN [id number]) as of 07/04/2016 10:14  Ref. Range 07/03/2016 19:30 07/04/2016 05:53  Glucose Latest Ref Range: 65 - 99 mg/dL 409148 (H) 811262 (H)   Review of Glycemic Control  Diabetes history: NO Outpatient Diabetes medications: NA Current orders for Inpatient glycemic control: None  Inpatient Diabetes Program Recommendations: Correction (SSI): While inpatient and ordered steroids, please consider ordering CBGs with Novolog correction scale.  Thanks, Orlando PennerMarie Alta Goding, RN, MSN, CDE Diabetes Coordinator Inpatient Diabetes Program [phone] (Team Pager from 8am to 5pm) [phone] ([facility] office) [phone] [facility] office) [phone] [facility] office)

## 2016-07-04 NOTE — Care Management Note (Signed)
Case Management Note  Patient Details  Name: Melanie Mendoza MRN: [id number] Date of Birth: October 10, 1971  Subjective/Objective:   Patient adm from home with asthma exacerbation. She is ind with ADL's.  [facility]. She does not currently have insurance. She states she will have insurance next month.                 Action/Plan: Patient will need inhaled steroids and nebulizer meds at discharge today. Patient given MATCH voucher to cover cost. MATCH voucher and requirements all explained to patient.    Expected Discharge Date:            07/04/2016      Expected Discharge Plan:  Home/Self Care  In-House Referral:  NA  Discharge planning Services  CM Consult  Post Acute Care Choice:  NA Choice offered to:  NA  DME Arranged:    DME Agency:     HH Arranged:    HH Agency:     Status of Service:  Completed, signed off  If discussed at [employer] Length of Stay Meetings, dates discussed:    Additional Comments:  Mende Biswell, Chrystine OilerSharley Diane, RN 07/04/2016, 11:35 AM

## 2016-07-04 NOTE — ED Notes (Signed)
Report given to Bree RN 

## 2016-07-04 NOTE — Progress Notes (Signed)
Discharge instructions and prescriptions given, verbalized understanding, out in stable condition ambulatory with staff. 

## 2016-07-04 NOTE — Discharge Summary (Signed)
Physician Discharge Summary  [name]:[id number] DOB: Jul 20, 1971 DOA: 07/03/2016  PCP: Jacquelin Hawking, PA-C  Admit date: 07/03/2016 Discharge date: 07/04/2016  Time spent: 45 minutes  Recommendations for Outpatient Follow-up:  -Will be discharged home today. -Advised to follow up with PCP in 2 weeks.   Discharge Diagnoses:  Active Problems:   Asthma exacerbation   Discharge Condition: Stable and improved  Filed Weights   07/04/16 0110  Weight: 88 kg (194 lb)    History of present illness:  As per Dr. Sharl Ma on 9/20: Melanie Mendoza  is a 45 y.o. female, With history of asthma, pulmonary embolism on anticoagulation with warfarin, INR 2.0, who came to the hospital with worsening shortness of breath for 2 days. Patient said that she tried nebulizer treatments at home which did not help. She denies any fever, not coughing up any phlegm. In the ED CT angiogram of the chest was done which was negative for PE.  Hospital Course:   Asthma Exacerbation -Much improved. Patient says she feels at baseline. -Will DC on prednisone taper, albuterol nebs PRN. -Because she has been having daily symptoms for over 6 weeks, will add an inhaled steroid (Flovent) to her medications regimen.  H/o PE -Continue coumadin.  Procedures:  None   Consultations:  None  Discharge Instructions  Discharge Instructions    Diet - low sodium heart healthy    Complete by:  As directed    Increase activity slowly    Complete by:  As directed        Medication List    TAKE these medications   acetaminophen 500 MG tablet Commonly known as:  TYLENOL Take 1,000 mg by mouth every 6 (six) hours as needed for moderate pain.   albuterol 108 (90 Base) MCG/ACT inhaler Commonly known as:  PROVENTIL HFA;VENTOLIN HFA Inhale 2 puffs into the lungs every 4 (four) hours as needed for wheezing or shortness of breath. What changed:  reasons to take this   albuterol (2.5 MG/3ML) 0.083% nebulizer  solution Commonly known as:  PROVENTIL Take 3 mLs (2.5 mg total) by nebulization every 6 (six) hours as needed for wheezing or shortness of breath. What changed:  when to take this  reasons to take this   fluticasone 110 MCG/ACT inhaler Commonly known as:  FLOVENT HFA Inhale 2 puffs into the lungs daily.   lovastatin 20 MG tablet Commonly known as:  MEVACOR Take 1 tablet (20 mg total) by mouth at bedtime.   predniSONE 10 MG tablet Commonly known as:  DELTASONE Take 1 tablet (10 mg total) by mouth daily with breakfast.   sulfaSALAzine 500 MG tablet Commonly known as:  AZULFIDINE Take 500 mg by mouth 2 (two) times daily.   warfarin 5 MG tablet Commonly known as:  COUMADIN Take 1 tablet (5 mg total) by mouth daily.      No Known Allergies Follow-up Information    Jacquelin Hawking, PA-C Follow up on 07/16/2016.   Specialty:  Physician Assistant Why:  At 8:30am Contact information: [address] [place] [address] [phone]            The results of significant diagnostics from this hospitalization (including imaging, microbiology, ancillary and laboratory) are listed below for reference.    Significant Diagnostic Studies: Dg Chest 2 View  Result Date: 06/25/2016 CLINICAL DATA:  Worsening dyspnea over the past 30 minutes. No relief from breathing treatments. EXAM: CHEST  2 VIEW COMPARISON:  04/11/2016 FINDINGS: Mild hyperinflation. No  airspace consolidation. No effusions. Normal pulmonary vasculature. Normal hilar and mediastinal contours. Heart size is normal, unchanged. IMPRESSION: Mild hyperinflation. Electronically Signed   By: Ellery Plunk M.D.   On: 06/25/2016 23:10   Ct Angio Chest Pe W And/or Wo Contrast  Result Date: 07/03/2016 CLINICAL DATA:  Wheezing and shortness of breath tonight, similar symptoms for week, worsening now. History of pulmonary embolism, polysubstance abuse, ulcerative colitis. EXAM: CT ANGIOGRAPHY CHEST WITH CONTRAST  TECHNIQUE: Multidetector CT imaging of the chest was performed using the standard protocol during bolus administration of intravenous contrast. Multiplanar CT image reconstructions and MIPs were obtained to evaluate the vascular anatomy. CONTRAST:  100 cc Isovue 370 COMPARISON:  Chest radiograph July 03, 2016 at 1929 hours and CT chest September 10, 2013 FINDINGS: CARDIOVASCULAR: Moderate respiratory motion degraded examination. Streak artifact limits assessment for upper lobe pulmonary emboli. Adequate contrast opacification of the pulmonary artery's. Main pulmonary artery is not enlarged. No pulmonary arterial filling defects to the level of the subsegmental branches. Heart size is normal, no right heart strain. No pericardial effusions. Thoracic aorta is normal course and caliber, unremarkable. MEDIASTINUM/NODES: No lymphadenopathy by CT size criteria. Sub cm mediastinal lymph nodes are likely reactive, similar to prior CT. LUNGS/PLEURA: Tracheobronchial tree is patent, no pneumothorax. No pleural effusions, focal consolidations, pulmonary nodules or masses. UPPER ABDOMEN: Included view of the abdomen is unremarkable. MUSCULOSKELETAL: Presume motion artifact through the sternum. Visualized soft tissues and included osseous structures appear nonacute. Loose body RIGHT shoulder. Review of the MIP images confirms the above findings. IMPRESSION: No acute pulmonary embolism or acute cardiopulmonary process on this moderately respiratory motion degraded examination. Person motion artifact to the sternum, recommend correlation point tenderness. Electronically Signed   By: Awilda Metro M.D.   On: 07/03/2016 21:48   Dg Chest Portable 1 View  Result Date: 07/03/2016 CLINICAL DATA:  Acute onset of shortness of breath and wheezing. Initial encounter. EXAM: PORTABLE CHEST 1 VIEW COMPARISON:  Chest radiograph performed 06/25/2016 FINDINGS: The lungs are well-aerated. Mild vascular congestion is noted. There is no  evidence of focal opacification, pleural effusion or pneumothorax. The cardiomediastinal silhouette is within normal limits. No acute osseous abnormalities are seen. IMPRESSION: Mild vascular congestion noted.  Lungs remain grossly clear. Electronically Signed   By: Roanna Raider M.D.   On: 07/03/2016 20:09    Microbiology: No results found for this or any previous visit (from the past 240 hour(s)).   Labs: Basic Metabolic Panel:  Recent Labs Lab 07/03/16 1930 07/04/16 0553  NA 138 134*  K 3.7 4.5  CL 102 105  CO2 30 19*  GLUCOSE 148* 262*  BUN 10 9  CREATININE 0.67 0.67  CALCIUM 9.1 8.5*   Liver Function Tests:  Recent Labs Lab 07/03/16 1930 07/04/16 0553  AST 21 35  ALT 25 28  ALKPHOS 63 63  BILITOT 0.4 0.2*  PROT 7.1 7.2  ALBUMIN 4.0 3.9   No results for input(s): LIPASE, AMYLASE in the last 168 hours. No results for input(s): AMMONIA in the last 168 hours. CBC:  Recent Labs Lab 07/03/16 1930 07/04/16 0553  WBC 12.6* 21.1*  NEUTROABS 8.0*  --   HGB 13.5 13.2  HCT 41.2 40.8  MCV 89.6 91.5  PLT 453* 452*   Cardiac Enzymes:  Recent Labs Lab 07/03/16 1930  TROPONINI <0.03   BNP: BNP (last 3 results)  Recent Labs  07/03/16 1930 07/04/16 0553  BNP 18.0 288.0*    ProBNP (last 3 results) No results for  input(s): PROBNP in the last 8760 hours.  CBG: No results for input(s): GLUCAP in the last 168 hours.     SignedChaya Jan:  HERNANDEZ ACOSTA,ESTELA  [facility] Pager: [phone] 07/04/2016, 4:20 PM

## 2016-07-15 ENCOUNTER — Ambulatory Visit (HOSPITAL_COMMUNITY): Payer: Self-pay

## 2016-07-16 ENCOUNTER — Ambulatory Visit: Payer: Self-pay | Admitting: Physician Assistant

## 2016-07-18 ENCOUNTER — Encounter: Payer: Self-pay | Admitting: Physician Assistant

## 2016-07-24 ENCOUNTER — Ambulatory Visit (INDEPENDENT_AMBULATORY_CARE_PROVIDER_SITE_OTHER): Payer: Self-pay | Admitting: *Deleted

## 2016-07-24 DIAGNOSIS — Z7901 Long term (current) use of anticoagulants: Secondary | ICD-10-CM

## 2016-07-24 DIAGNOSIS — I82499 Acute embolism and thrombosis of other specified deep vein of unspecified lower extremity: Secondary | ICD-10-CM

## 2016-07-24 DIAGNOSIS — I2699 Other pulmonary embolism without acute cor pulmonale: Secondary | ICD-10-CM

## 2016-07-24 LAB — POCT INR: INR: 2.8

## 2016-07-30 ENCOUNTER — Encounter (HOSPITAL_COMMUNITY): Payer: Self-pay | Attending: Oncology | Admitting: Oncology

## 2016-07-30 ENCOUNTER — Encounter (HOSPITAL_COMMUNITY): Payer: Self-pay | Admitting: Oncology

## 2016-07-30 VITALS — BP 121/83 | HR 80 | Temp 98.3°F | Resp 20 | Ht 66.5 in | Wt 198.2 lb

## 2016-07-30 DIAGNOSIS — Z7901 Long term (current) use of anticoagulants: Secondary | ICD-10-CM

## 2016-07-30 DIAGNOSIS — I82502 Chronic embolism and thrombosis of unspecified deep veins of left lower extremity: Secondary | ICD-10-CM

## 2016-07-30 DIAGNOSIS — I2699 Other pulmonary embolism without acute cor pulmonale: Secondary | ICD-10-CM

## 2016-07-30 MED ORDER — RIVAROXABAN 20 MG PO TABS: 20.0000 mg | ORAL_TABLET | Freq: Every day | ORAL | 11 refills | Status: DC

## 2016-07-30 NOTE — Patient Instructions (Addendum)
[facility] at [facility] Discharge Instructions  RECOMMENDATIONS MADE BY THE CONSULTANT AND ANY TEST RESULTS WILL BE SENT TO YOUR REFERRING PHYSICIAN.  You were seen by Elijah Birkom and Dr. Galen ManilaPenland today. Start Xarelto Thursday Return in 4-5 weeks for follow up.  Thank you for choosing [facility] at [facility] to provide your oncology and hematology care.  To afford each patient quality time with our provider, please arrive at least 15 minutes before your scheduled appointment time.   Beginning January 23rd 2017 lab work for the [facility] will be done in the  Main lab at [facility] on 1st floor. If you have a lab appointment with the Cancer Center please come in thru the  Main Entrance and check in at the main information desk  You need to re-schedule your appointment should you arrive 10 or more minutes late.  We strive to give you quality time with our providers, and arriving late affects you and other patients whose appointments are after yours.  Also, if you no show three or more times for appointments you may be dismissed from the clinic at the providers discretion.     Again, thank you for choosing [facility].  Our hope is that these requests will decrease the amount of time that you wait before being seen by our physicians.       _____________________________________________________________  Should you have questions after your visit to [facility], please contact our office at [phone] between the hours of 8:30 a.m. and 4:30 p.m.  Voicemails left after 4:30 p.m. will not be returned until the following business day.  For prescription refill requests, have your pharmacy contact our office.         Resources For Cancer Patients and their Caregivers ? American Cancer Society: Can assist with transportation, wigs, general needs, runs Look Good Feel Better.        [phone] ? Cancer Care: Provides  financial assistance, online support groups, medication/co-pay assistance.  1-800-813-HOPE 747-610-6166(4673) ? [facility] Assists [place] cancer patients and their families through emotional , educational and financial support.  770 711 8008(414)[phone] ? [place] DSS Where to apply for food stamps, Medicaid and utility assistance. (778)647-0513(323)866-8873 ? [facility]: Transportation to medical appointments. 863 223 01106785363243 ? Social Security Administration: May apply for disability if have a Stage IV cancer. 469 066 [phone]) [phone] ? [place] [employer], Dis[employer] and Transit Services: Assists with nutrition, care and transit needs. 719-076-4008(204) 130-3338  Cancer Center Support Programs: @10RELATIVEDAYS @ > Cancer Support Group  2nd Tuesday of the month 1pm-2pm, Journey Room  > Creative Journey  3rd Tuesday of the month 1130am-1pm, Journey Room  > Look Good Feel Better  1st Wednesday of the month 10am-12 noon, Journey Room (Call American Cancer Society to register [phone])

## 2016-07-30 NOTE — Progress Notes (Signed)
[facility] Hematology/Oncology Consultation   Name: Melanie Mendoza      MRN: [id number]    Date: 07/30/2016 Time:6:19 PM   REFERRING PHYSICIAN:  Jacquelin Hawking, PA-C (Primary Care Provider)  REASON FOR CONSULT:  Evaluate for ongoing need for anticoagulation   DIAGNOSIS:  Bilateral pulmonary emboli (R>L) with moderate clot burden and right heart strain (submassive PE) on 09/10/2013 in the setting of DVT in left lower extremity involving the left popliteal vein on 09/08/2013; on Coumadin anticoagulation since.  HISTORY OF PRESENT ILLNESS:   Melanie Mendoza is a 45 y.o. female with a medical history significant for H/O drug abuse, H/O incarceration due to drug charges and larceny, asthma, H/O of submassive PE and Left lower extremity DVT in November 2014, hyperlipidemia, and ulcerative colitis on sulfasalazine  who is referred to the [facility] for hematology recommendations regarding ongoing anticoagulation.  On 08/20/2013, the patient presented to the ED with SOB and chest pain.  She was found to be encephalopathic and there was concern for drug overdose at that time.  She was admitted and the patient left AMA on 08/21/2013.   She re-presented on 09/08/2013 with shortness of breath and left leg pain.  She was found to have a left lower extremity DVT on 09/08/2013.  She was started on anticoagulation and admitted.  On 09/10/2013, CT angio per PE protocol was performed and found to be positive for a submassive PE.  Hypercoag panel was performed on 09/13/2013 and was negative EXCEPT for low protein C and protein S activity.  She was incarcerated shortly following her PE diagnosis.  She was maintained on Coumadin with weekly INRs while serving a 2 year sentence in [place], [place] for larceny.  She notes that her INRs were well maintained while incarcerated.  She also have ulcerative colitis which is currently managed on sulfasalazine.  She was scheduled to see Dr. Jena Gauss, but  missed that appointment.  From a drug standpoint, she has been clean x 2 years.  She is not on any controlled substances at this time.  [place] Controlled Substance Reporting System is reviewed and confirms no filled medications in the state of [place].  She reports that this instance is her only VTE.  She reports that her mother had a DVT in her upper extremity.  She is deceased from lung cancer.  She reports that the DVT was years prior to her lung cancer diagnosis.  She denies any complaints today.  Review of Systems  Constitutional: Negative.  Negative for chills, fever and weight loss.  HENT: Negative.  Negative for nosebleeds.   Eyes: Negative.   Respiratory: Negative.  Negative for cough and shortness of breath.   Cardiovascular: Negative.  Negative for chest pain and leg swelling.  Gastrointestinal: Negative.  Negative for abdominal pain, blood in stool, melena, nausea and vomiting.  Genitourinary: Negative for hematuria.  Musculoskeletal: Negative.   Skin: Negative.  Negative for rash.  Neurological: Negative.  Negative for weakness and headaches.  Endo/Heme/Allergies: Negative.   Psychiatric/Behavioral: Negative.   14 point review of systems was performed and is negative except as detailed under history of present illness and above    PAST MEDICAL HISTORY:   Past Medical History:  Diagnosis Date  . Acute pulmonary embolism (HCC) 09/13/2013  . Asthma   . Bilateral ovarian cysts   . PE (pulmonary embolism) 09/2013  . Polysubstance abuse   . Ulcerative colitis (HCC)  ALLERGIES: No Known Allergies    MEDICATIONS: I have reviewed the patient's current medications.    Current Outpatient Prescriptions on File Prior to Visit  Medication Sig Dispense Refill  . acetaminophen (TYLENOL) 500 MG tablet Take 1,000 mg by mouth every 6 (six) hours as needed for moderate pain.    Marland Kitchen. albuterol (PROVENTIL HFA;VENTOLIN HFA) 108 (90 Base) MCG/ACT inhaler Inhale 2 puffs  into the lungs every 4 (four) hours as needed for wheezing or shortness of breath. 1 Inhaler 3  . albuterol (PROVENTIL) (2.5 MG/3ML) 0.083% nebulizer solution Take 3 mLs (2.5 mg total) by nebulization every 6 (six) hours as needed for wheezing or shortness of breath. 75 mL 12  . fluticasone (FLOVENT HFA) 110 MCG/ACT inhaler Inhale 2 puffs into the lungs daily. 1 Inhaler 12  . lovastatin (MEVACOR) 20 MG tablet Take 1 tablet (20 mg total) by mouth at bedtime. 30 tablet 4  . sulfaSALAzine (AZULFIDINE) 500 MG tablet Take 500 mg by mouth 2 (two) times daily.      No current facility-administered medications on file prior to visit.      PAST SURGICAL HISTORY History reviewed. No pertinent surgical history.  FAMILY HISTORY: Family History  Problem Relation Age of Onset  . Lung cancer Mother   . Cancer Mother     SOCIAL HISTORY: She admits to a history of drug abuse including benzos and opiates.  She admits to a history of EtOHism.  She is clean x 2 years.  She is engaged.  Social History   Social History  . Marital status: Divorced    Spouse name: N/A  . Number of children: N/A  . Years of education: N/A   Social History Main Topics  . Smoking status: Never Smoker  . Smokeless tobacco: Never Used  . Alcohol use No  . Drug use: No     Comment: none since 2014  . Sexual activity: Yes    Birth control/ protection: None   Other Topics Concern  . None   Social History Narrative  . None    PERFORMANCE STATUS: The patient's performance status is 0 - Asymptomatic  PHYSICAL EXAM: Most Recent Vital Signs: Blood pressure 121/83, pulse 80, temperature 98.3 F (36.8 C), temperature source Oral, resp. rate 20, height 5' 6.5" (1.689 m), weight 198 lb 3.2 oz (89.9 kg). General appearance: alert, cooperative, appears stated age, no distress, moderately obese and accompanied by her cousin Aggie Cosierheresa Head: Normocephalic, without obvious abnormality, atraumatic Throat: normal findings: lips  normal without lesions and oropharynx pink & moist without lesions or evidence of thrush Neck: no adenopathy, supple, symmetrical, trachea midline and thyroid not enlarged, symmetric, no tenderness/mass/nodules Lungs: clear to auscultation bilaterally and normal percussion bilaterally Heart: regular rate and rhythm, S1, S2 normal, no murmur, click, rub or gallop Abdomen: soft, non-tender; bowel sounds normal; no masses,  no organomegaly Extremities: extremities normal, atraumatic, no cyanosis or edema Skin: Skin color, texture, turgor normal. No rashes or lesions Lymph nodes: Cervical, supraclavicular, and axillary nodes normal. Neurologic: Alert and oriented X 3, normal strength and tone. Normal symmetric reflexes. Normal coordination and gait  LABORATORY DATA:  CBC    Component Value Date/Time   WBC 21.1 (H) 07/04/2016 0553   RBC 4.46 07/04/2016 0553   HGB 13.2 07/04/2016 0553   HCT 40.8 07/04/2016 0553   PLT 452 (H) 07/04/2016 0553   MCV 91.5 07/04/2016 0553   MCH 29.6 07/04/2016 0553   MCHC 32.4 07/04/2016 0553   RDW 13.8 07/04/2016  0553   LYMPHSABS 3.0 07/03/2016 1930   MONOABS 0.9 07/03/2016 1930   EOSABS 0.7 07/03/2016 1930   BASOSABS 0.1 07/03/2016 1930     Chemistry      Component Value Date/Time   NA 134 (L) 07/04/2016 0553   K 4.5 07/04/2016 0553   CL 105 07/04/2016 0553   CO2 19 (L) 07/04/2016 0553   BUN 9 07/04/2016 0553   CREATININE 0.67 07/04/2016 0553   CREATININE 0.78 04/20/2016 0823      Component Value Date/Time   CALCIUM 8.5 (L) 07/04/2016 0553   ALKPHOS 63 07/04/2016 0553   AST 35 07/04/2016 0553   ALT 28 07/04/2016 0553   BILITOT 0.2 (L) 07/04/2016 0553     Lab Results  Component Value Date   INR 2.8 07/24/2016   INR 1.95 07/04/2016   INR 2.0 07/03/2016         RADIOGRAPHY: No results found.    CLINICAL DATA:  Wheezing and shortness of breath tonight, similar symptoms for week, worsening now. History of pulmonary  embolism, polysubstance abuse, ulcerative colitis.  EXAM: CT ANGIOGRAPHY CHEST WITH CONTRAST  TECHNIQUE: Multidetector CT imaging of the chest was performed using the standard protocol during bolus administration of intravenous contrast. Multiplanar CT image reconstructions and MIPs were obtained to evaluate the vascular anatomy.  CONTRAST:  100 cc Isovue 370  COMPARISON:  Chest radiograph July 03, 2016 at 1929 hours and CT chest September 10, 2013  FINDINGS: CARDIOVASCULAR: Moderate respiratory motion degraded examination. Streak artifact limits assessment for upper lobe pulmonary emboli. Adequate contrast opacification of the pulmonary artery's. Main pulmonary artery is not enlarged. No pulmonary arterial filling defects to the level of the subsegmental branches. Heart size is normal, no right heart strain. No pericardial effusions. Thoracic aorta is normal course and caliber, unremarkable.  MEDIASTINUM/NODES: No lymphadenopathy by CT size criteria. Sub cm mediastinal lymph nodes are likely reactive, similar to prior CT.  LUNGS/PLEURA: Tracheobronchial tree is patent, no pneumothorax. No pleural effusions, focal consolidations, pulmonary nodules or masses.  UPPER ABDOMEN: Included view of the abdomen is unremarkable.  MUSCULOSKELETAL: Presume motion artifact through the sternum. Visualized soft tissues and included osseous structures appear nonacute. Loose body RIGHT shoulder.  Review of the MIP images confirms the above findings.  IMPRESSION: No acute pulmonary embolism or acute cardiopulmonary process on this moderately respiratory motion degraded examination.  Person motion artifact to the sternum, recommend correlation point tenderness.   Electronically Signed   By: Awilda Metro M.D.   On: 07/03/2016 21:48  CLINICAL DATA:  Chest pain and shortness of breath. History of a DVT.  EXAM: CT ANGIOGRAPHY CHEST WITH  CONTRAST  TECHNIQUE: Multidetector CT imaging of the chest was performed using the standard protocol during bolus administration of intravenous contrast. Multiplanar CT image reconstructions including MIPs were obtained to evaluate the vascular anatomy.  CONTRAST:  OMNIPAQUE IOHEXOL 350 MG/ML SOLN  COMPARISON:  Chest radiograph, 09/08/2013  FINDINGS: There are multiple segmental pulmonary emboli. On the right, there are segmental pulmonary emboli to the anterior and apical upper lobe segments. There are smaller segmental middle lobe emboli and there are segmental emboli to the right lower lobe, most prominent to the posterior-basilar segment. Less pulmonary emboli are noted on the left. There are segmental lower lobe branch emboli. No other convincing emboli. Overall embolus burden is moderate.  The right ventricular to left ventricular ratio is 0.973 which can be indicative of right heart strain.  The heart is normal in size. The  great vessels are normal in caliber. There is shotty mild mediastinal adenopathy. A 13 mm short axis subcarinal node is noted. There is an 11 mm short axis right peritracheal node. These are likely reactive. No mediastinal masses. No hilar masses or enlarged lymph nodes. There arm prominent hilar lymph nodes, however.  There are no areas of lung consolidation to suggest infarction. Subsegmental atelectasis is noted most evident in the left lower lobe. Reticular opacities are noted elsewhere, most prominent in the anteromedial right upper lobe and right middle lobe, also likely subsegmental atelectasis.  No pleural effusion.  No pneumothorax.  Limited evaluation of the upper abdomen is unremarkable.  Minimal degenerative changes are noted of the thoracic spine.  Review of the MIP images confirms the above findings.  IMPRESSION: 1. There are bilateral pulmonary emboli affecting segmental and smaller branches, more notable on  the right. Overall clot burden is moderate. 2. RV/LV ratio is mildly increased, which can be indicative of right heart strain and sub massive pulmonary embolus. Clinical correlation is advised. 3. Lungs show areas of subsegmental atelectasis, but no evidence of infarction. No pleural effusion.   Electronically Signed   By: Amie Portland M.D.   On: 09/10/2013 16:38    PATHOLOGY:  n/a  ASSESSMENT/PLAN:   Acute pulmonary embolism (HCC) LLE DVT Coumadin  Unprovoked pulmonary embolism in the setting of a left lower extremity DVT in November 2014.  Anticoagulated with Vitamin K antagonist since 2014 without recurrence of VTE.  She is provided recent information regarding the growing data surrounding unprovoked VTE: Unprovoked proximal DVT and symptomatic PE-The decision regarding the use of indefinite anticoagulation in patients with a first episode of unprovoked proximal deep vein thrombosis (DVT), unprovoked symptomatic pulmonary embolism (PE), or patients with active cancer is dependent upon patient-specific bleeding and thrombotic risks as well as the patient's values and preferences. For those with a low to moderate bleeding risk, we suggest indefinite treatment rather than treatment for three to six months. For patients with a high bleeding risk, the benefits of indefinite anticoagulation are likely outweighed by the high risk of bleeding such that indefinite anticoagulation is not generally performed.  The rationale for indefinite anticoagulation in patients with an unprovoked proximal DVT or symptomatic PE is based upon the high estimated lifetime risk of recurrent VTE, which can be dramatically reduced by extending anticoagulation beyond the conventional three to six months. Importantly, most studies report a risk reduction in the rate of VTE recurrence at the expense of an increased rate of bleeding and without mortality benefit.  The estimated risk of recurrence following  cessation of anticoagulation in patients with a first unprovoked episode of VTE is 10 percent at one year and 30 percent at five years (approximately 5 percent per year after the first year). Prior duration of therapy does not affect the risk of recurrence once anticoagulant therapy is discontinued. Full anticoagulation is associated with an over 90 percent reduction in the rate of recurrence compared with low-intensity anticoagulant regimens and aspirin which are less effective (approximately 60 and 30 percent rate reduction in recurrence respectively). This decrease in recurrence outweighs the rate of bleeding with full anticoagulation in most patients who are estimated to have a low (0.8 percent per year) or intermediate (1.6 percent per year) bleeding risk.   We discussed options moving forward including ongoing Vitamin K antagonist therapy which will require dietary and laboratory monitoring, changing to Direct Thrombin inhibitor therapy, or stopping anticoagulation altogether understanding the abovementioned information and real  risk of recurrence.  We reviewed the risks, benefits, alternates, and side effects of Xarelto including anaphylaxis, bleeding, easy bruising.  She is advised to wear her seatbelt, follow directions of power tools, follow safety instructions on firearms, avoid contact sports, etc.    She is provided a started pack for Xarelto.  She will hold her Coumadin tomorrow (Wednesday) and start the Xarelto starter pack on Thursday (08/01/2016).  Rx is printed for Xarelto 20 mg for the future.  This is given to Angie to work on ascertaining affordable medication for this uninsured patient.  Return in 4 weeks for follow-up.  If all is well at that time, annual follow-up would be reasonable. Thrombophilia evaluation was done while patient was on lovenox, mildly decreased protein C/S would be expected in setting of acute DVT/PE. I do note see any reason to repeat hypercoag evaluation.    MEDICATIONS PRESCRIBED THIS ENCOUNTER: Meds ordered this encounter  Medications  . rivaroxaban (XARELTO) 20 MG TABS tablet    Sig: Take 1 tablet (20 mg total) by mouth daily with supper.    Dispense:  30 tablet    Refill:  11    Order Specific Question:   Supervising Provider    Answer:   Allene Pyo [id number]    All questions were answered. The patient knows to call the clinic with any problems, questions or concerns. We can certainly see the patient much sooner if necessary..  This note is electronically signed [email or web address] Baxter Hire, MD  07/30/2016 6:19 PM

## 2016-07-30 NOTE — Assessment & Plan Note (Addendum)
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  Anticoagulated with Vitamin K antagonist since 2014 without recurrence of VTE.  She is provided recent information regarding the growing data surrounding unprovoked VTE: Unprovoked proximal DVT and symptomatic PE-The decision regarding the use of indefinite anticoagulation in patients with a first episode of unprovoked proximal deep vein thrombosis (DVT), unprovoked symptomatic pulmonary embolism (PE), or patients with active cancer is dependent upon patient-specific bleeding and thrombotic risks as well as the patient's values and preferences. For those with a low to moderate bleeding risk, we suggest indefinite treatment rather than treatment for three to six months. For patients with a high bleeding risk, the benefits of indefinite anticoagulation are likely outweighed by the high risk of bleeding such that indefinite anticoagulation is not generally performed.  The rationale for indefinite anticoagulation in patients with an unprovoked proximal DVT or symptomatic PE is based upon the high estimated lifetime risk of recurrent VTE, which can be dramatically reduced by extending anticoagulation beyond the conventional three to six months. Importantly, most studies report a risk reduction in the rate of VTE recurrence at the expense of an increased rate of bleeding and without mortality benefit.  The estimated risk of recurrence following cessation of anticoagulation in patients with a first unprovoked episode of VTE is 10 percent at one year and 30 percent at five years (approximately 5 percent per year after the first year). Prior duration of therapy does not affect the risk of recurrence once anticoagulant therapy is discontinued. Full anticoagulation is associated with an over 90 percent reduction in the rate of recurrence compared with low-intensity anticoagulant regimens and aspirin which are less effective  (approximately 60 and 30 percent rate reduction in recurrence respectively). This decrease in recurrence outweighs the rate of bleeding with full anticoagulation in most patients who are estimated to have a low (0.8 percent per year) or intermediate (1.6 percent per year) bleeding risk.   We discussed options moving forward including ongoing Vitamin K antagonist therapy which will require dietary and laboratory monitoring, changing to Direct Thrombin inhibitor therapy, or stopping anticoagulation altogether understanding the abovementioned information and real risk of recurrence.  We reviewed the risks, benefits, alternates, and side effects of Xarelto including anaphylaxis, bleeding, easy bruising.  She is advised to wear her seatbelt, follow directions of power tools, follow safety instructions on firearms, avoid contact sports, etc.    She is provided a started pack for Xarelto.  She will hold her Coumadin tomorrow (Wednesday) and start the Xarelto starter pack on Thursday (08/01/2016).  Rx is printed for Xarelto 20 mg for the future.  This is given to Angie to work on ascertaining affordable medication for this uninsured patient.  Return in 4 weeks for follow-up.  If all is well at that time, annual follow-up would be reasonable.

## 2016-08-01 ENCOUNTER — Ambulatory Visit: Payer: Self-pay | Admitting: Physician Assistant

## 2016-08-01 ENCOUNTER — Telehealth (HOSPITAL_COMMUNITY): Payer: Self-pay | Admitting: *Deleted

## 2016-08-01 ENCOUNTER — Encounter: Payer: Self-pay | Admitting: Physician Assistant

## 2016-08-01 VITALS — BP 120/86 | HR 98 | Temp 97.9°F | Ht 66.5 in | Wt 195.5 lb

## 2016-08-01 DIAGNOSIS — N644 Mastodynia: Secondary | ICD-10-CM

## 2016-08-01 DIAGNOSIS — Z7901 Long term (current) use of anticoagulants: Secondary | ICD-10-CM

## 2016-08-01 DIAGNOSIS — Z711 Person with feared health complaint in whom no diagnosis is made: Secondary | ICD-10-CM

## 2016-08-01 NOTE — Progress Notes (Signed)
BP 120/86 (BP Location: Left Arm, Patient Position: Sitting, Cuff Size: Normal)   Pulse 98   Temp 97.9 F (36.6 C)   Ht 5' 6.5" (1.689 m)   Wt 195 lb 8 oz (88.7 kg)   SpO2 98%   BMI 31.08 kg/m    Subjective:    Patient ID: Melanie Mendoza, female    DOB: [birth year], 45 y.o.   MRN: [id number]  HPI: Melanie Mendoza is a 45 y.o. female presenting on 08/01/2016 for Breast Pain   HPI  mammogram ordered in september. It hasn't been scheduled yet. Pt says someone called her but she didn't schedule it b/c she just wanted to wait and do it today.    States L breast pain . States they both hurt but L > R, for about a month. It is constant.   Pt states black-green discharge for years.  No recent change in that.  She states lump left breast several years that was monitored q 6 mo when incarcerated.  She thinks it may be getting bigger.  She doesn't push on it b/c it hurts.    Pt also c/o ovarian cyst hurt that has been hurting.  She says that it has been removed in the past with surgery.  She says it started hurting again last several weeks and she can feel it when she mashes on her abdominal/pelvic area.   [facility] referred pt to pulmonologist for evaluation of continuing anticoagulation.  Cardiology (who manages coumadin clinic) referred pt to hematology for same thing.  Pt went to hematologist earlier this week so she has no need to go to pulmonologist for this.  Hematology notes reviewed.   Relevant past medical, surgical, family and social history reviewed and updated as indicated. Interim medical history since our last visit reviewed. Allergies and medications reviewed and updated.   Current Outpatient Prescriptions:  .  acetaminophen (TYLENOL) 500 MG tablet, Take 1,000 mg by mouth every 6 (six) hours as needed for moderate pain., Disp: , Rfl:  .  albuterol (PROVENTIL HFA;VENTOLIN HFA) 108 (90 Base) MCG/ACT inhaler, Inhale 2 puffs into the lungs every 4 (four) hours as needed for wheezing or  shortness of breath., Disp: 1 Inhaler, Rfl: 3 .  albuterol (PROVENTIL) (2.5 MG/3ML) 0.083% nebulizer solution, Take 3 mLs (2.5 mg total) by nebulization every 6 (six) hours as needed for wheezing or shortness of breath., Disp: 75 mL, Rfl: 12 .  fluticasone (FLOVENT HFA) 110 MCG/ACT inhaler, Inhale 2 puffs into the lungs daily., Disp: 1 Inhaler, Rfl: 12 .  lovastatin (MEVACOR) 20 MG tablet, Take 1 tablet (20 mg total) by mouth at bedtime., Disp: 30 tablet, Rfl: 4 .  rivaroxaban (XARELTO) 20 MG TABS tablet, Take 1 tablet (20 mg total) by mouth daily with supper., Disp: 30 tablet, Rfl: 11 .  sulfaSALAzine (AZULFIDINE) 500 MG tablet, Take 500 mg by mouth 2 (two) times daily. , Disp: , Rfl:    Review of Systems  Constitutional: Negative for appetite change, chills, diaphoresis, fatigue, fever and unexpected weight change.  HENT: Negative for congestion, dental problem, drooling, ear pain, facial swelling, hearing loss, mouth sores, sneezing, sore throat, trouble swallowing and voice change.   Eyes: Negative for pain, discharge, redness, itching and visual disturbance.  Respiratory: Positive for shortness of breath and wheezing. Negative for cough and choking.   Cardiovascular: Negative for chest pain, palpitations and leg swelling.  Gastrointestinal: Negative for abdominal pain, blood in stool, constipation, diarrhea and vomiting.  Endocrine: Negative  for cold intolerance, heat intolerance and polydipsia.  Genitourinary: Negative for decreased urine volume, dysuria and hematuria.  Musculoskeletal: Negative for arthralgias, back pain and gait problem.  Skin: Negative for rash.  Allergic/Immunologic: Negative for environmental allergies.  Neurological: Negative for seizures, syncope, light-headedness and headaches.  Hematological: Negative for adenopathy.  Psychiatric/Behavioral: Negative for agitation, dysphoric mood and suicidal ideas. The patient is not nervous/anxious.     Per HPI unless  specifically indicated above     Objective:    BP 120/86 (BP Location: Left Arm, Patient Position: Sitting, Cuff Size: Normal)   Pulse 98   Temp 97.9 F (36.6 C)   Ht 5' 6.5" (1.689 m)   Wt 195 lb 8 oz (88.7 kg)   SpO2 98%   BMI 31.08 kg/m   Wt Readings from Last 3 Encounters:  08/01/16 195 lb 8 oz (88.7 kg)  07/30/16 198 lb 3.2 oz (89.9 kg)  07/04/16 194 lb (88 kg)    Physical Exam  Constitutional: She is oriented to person, place, and time. She appears well-developed and well-nourished.  (nurse Lupita Leashonna chaperoned exam)  Pulmonary/Chest: Effort normal.  Abdominal: There is no tenderness. There is no rigidity, no rebound and no guarding.    Area is where pt reported feeling her "ovarian cyst".  Examiner unable to palpate mass.  Pt also unable to find mass and states it no longer hurts and so it must have gone away.   Genitourinary: There is breast tenderness. No breast swelling, discharge or bleeding.  Genitourinary Comments: Both breast very mildly tender all over.  No point tenderness.  Breasts are ropey and lumpy. No discrete mass palpable.  Neurological: She is alert and oriented to person, place, and time.  Skin: Skin is warm and dry.  Psychiatric: She has a normal mood and affect. Her behavior is normal. Thought content normal.  Nursing note and vitals reviewed.       Assessment & Plan:   Encounter Diagnoses  Name Primary?  . Breast pain Yes  . Feared complaint without diagnosis   . On anticoagulant therapy    Encounter Diagnoses  Name Primary?  . Breast pain Yes  . Feared complaint without diagnosis   . On anticoagulant therapy      -D/c pulmonology appt next week since anticoagulation status was addressed by hematology  Get pt set up with med[facility]ist for the xaralto (if not already ordered by hematology- nurse will check status)  -diagnostic mammogram  -f/u December as scheduled.  RTO sooner prn

## 2016-08-01 NOTE — Telephone Encounter (Signed)
[facility] phoned stating that pt will get med assist through their office.

## 2016-08-05 ENCOUNTER — Telehealth (HOSPITAL_COMMUNITY): Payer: Self-pay | Admitting: Oncology

## 2016-08-05 NOTE — Telephone Encounter (Signed)
CALLED PT HAD TO LEAVE VM REQUESTING TAX FORMS AND HOUSE HOLD INCOME INFO FOR [employer] PT ASSIST APP. REQUESTED THE PT CALL AND COME BY TO SIGN APP

## 2016-08-07 ENCOUNTER — Institutional Professional Consult (permissible substitution): Payer: Self-pay | Admitting: Pulmonary Disease

## 2016-08-08 ENCOUNTER — Telehealth: Payer: Self-pay | Admitting: Student

## 2016-08-08 NOTE — Telephone Encounter (Signed)
[facility] 08[facility]01-16 [facility]. Lorelle FormosaMarnie is to get in tough with pt to schedule.

## 2016-08-08 NOTE — Telephone Encounter (Signed)
-----   Message from Jacquelin HawkingShannon McElroy, [place]-C sent at 08/01/2016  5:50 PM EDT ----- Pt needs diagnostic mammogram. I did not put order in epic as I think [facility]  Please let me know/get it arranged. thanks

## 2016-08-12 ENCOUNTER — Encounter: Payer: Self-pay | Admitting: Physician Assistant

## 2016-08-12 ENCOUNTER — Ambulatory Visit: Payer: Self-pay | Admitting: Physician Assistant

## 2016-08-12 VITALS — BP 114/74 | HR 92 | Temp 97.3°F | Ht 66.5 in | Wt 199.8 lb

## 2016-08-12 DIAGNOSIS — K0889 Other specified disorders of teeth and supporting structures: Secondary | ICD-10-CM

## 2016-08-12 DIAGNOSIS — K047 Periapical abscess without sinus: Secondary | ICD-10-CM

## 2016-08-12 MED ORDER — AMOXICILLIN 500 MG PO CAPS: 500.0000 mg | ORAL_CAPSULE | Freq: Three times a day (TID) | ORAL | 0 refills | Status: AC

## 2016-08-12 NOTE — Progress Notes (Signed)
   BP 114/74 (BP Location: Left Arm, Patient Position: Sitting, Cuff Size: Normal)   Pulse 92   Temp 97.3 F (36.3 C)   Ht 5' 6.5" (1.689 m)   Wt 199 lb 12.8 oz (90.6 kg)   SpO2 97%   BMI 31.77 kg/m    Subjective:    Patient ID: Melanie Mendoza, female    DOB: 08-16-1971, 45 y.o.   MRN: [id number]  HPI: Melanie Mendoza is a 45 y.o. female presenting on 08/12/2016 for Abscess (tooth upper R side. pt states her tooth broke off to the gum. pt states she "popped it" but is swelling back up now.)   HPI   States abscess popped last night  Relevant past medical, surgical, family and social history reviewed and updated as indicated. Interim medical history since our last visit reviewed. Allergies and medications reviewed and updated.    Review of Systems  Constitutional: Negative for fatigue and fever.  HENT: Positive for dental problem. Negative for facial swelling.     Per HPI unless specifically indicated above     Objective:    BP 114/74 (BP Location: Left Arm, Patient Position: Sitting, Cuff Size: Normal)   Pulse 92   Temp 97.3 F (36.3 C)   Ht 5' 6.5" (1.689 m)   Wt 199 lb 12.8 oz (90.6 kg)   SpO2 97%   BMI 31.77 kg/m   Wt Readings from Last 3 Encounters:  08/12/16 199 lb 12.8 oz (90.6 kg)  08/01/16 195 lb 8 oz (88.7 kg)  07/30/16 198 lb 3.2 oz (89.9 kg)    Physical Exam  Constitutional: She is oriented to person, place, and time. She appears well-developed and well-nourished.  HENT:  Mouth/Throat: Uvula is midline and oropharynx is clear and moist. No trismus in the jaw. Abnormal dentition. Dental abscesses and dental caries present. No uvula swelling.    Molar tooth decayed down to gum line.  Red tender tissue/abscess R palate. Not fluctuant (burst last night). Posterior pharynx clear.  Neurological: She is alert and oriented to person, place, and time.  Skin: Skin is warm and dry.  Psychiatric: She has a normal mood and affect. Her behavior is normal.    Nursing note and vitals reviewed.       Assessment & Plan:   Encounter Diagnoses  Name Primary?  Norva Riffle. Dentalgia Yes  . Dental abscess     -rx amoxil. -counseled pt to go to dentist for definitive care/removal of remaining portion of tooth -follow up as scheduled.  RTO sooner prn

## 2016-08-14 ENCOUNTER — Ambulatory Visit (INDEPENDENT_AMBULATORY_CARE_PROVIDER_SITE_OTHER): Payer: Self-pay | Admitting: Internal Medicine

## 2016-08-14 ENCOUNTER — Encounter: Payer: Self-pay | Admitting: *Deleted

## 2016-08-14 NOTE — Progress Notes (Signed)
Glad to help. KEFALAS,THOMAS, PA-C 08/14/2016 4:11 PM

## 2016-08-28 ENCOUNTER — Other Ambulatory Visit: Payer: Self-pay | Admitting: Physician Assistant

## 2016-08-28 MED ORDER — ALBUTEROL SULFATE HFA 108 (90 BASE) MCG/ACT IN AERS: 2.0000 | INHALATION_SPRAY | RESPIRATORY_TRACT | 3 refills | Status: DC | PRN

## 2016-09-02 ENCOUNTER — Encounter (INDEPENDENT_AMBULATORY_CARE_PROVIDER_SITE_OTHER): Payer: Self-pay | Admitting: Internal Medicine

## 2016-09-02 ENCOUNTER — Encounter (INDEPENDENT_AMBULATORY_CARE_PROVIDER_SITE_OTHER): Payer: Self-pay

## 2016-09-02 ENCOUNTER — Ambulatory Visit (INDEPENDENT_AMBULATORY_CARE_PROVIDER_SITE_OTHER): Payer: Self-pay | Admitting: Internal Medicine

## 2016-09-03 ENCOUNTER — Ambulatory Visit (HOSPITAL_COMMUNITY): Payer: Self-pay | Admitting: Hematology & Oncology

## 2016-09-10 ENCOUNTER — Encounter: Payer: Self-pay | Admitting: Physician Assistant

## 2016-09-10 ENCOUNTER — Other Ambulatory Visit: Payer: Self-pay | Admitting: Physician Assistant

## 2016-09-10 MED ORDER — ATORVASTATIN CALCIUM 20 MG PO TABS: 20.0000 mg | ORAL_TABLET | Freq: Every day | ORAL | 1 refills | Status: DC

## 2016-09-10 MED ORDER — ALBUTEROL SULFATE (2.5 MG/3ML) 0.083% IN NEBU: 2.5000 mg | INHALATION_SOLUTION | Freq: Four times a day (QID) | RESPIRATORY_TRACT | 3 refills | Status: DC | PRN

## 2016-09-10 MED ORDER — MOMETASONE FURO-FORMOTEROL FUM 100-5 MCG/ACT IN AERO
2.0000 | INHALATION_SPRAY | Freq: Two times a day (BID) | RESPIRATORY_TRACT | 2 refills | Status: DC
Start: 2016-09-10 — End: 2020-01-19

## 2016-09-10 NOTE — Progress Notes (Signed)
The following medication changes were done in order for patient to receive medicines for free through prescription assistance program. [facility] Lov[facility]atin was [facility] to at[facility]vastation. [facility]  PA requested patient's appointment be rescheduled for 3 months out from today in order to recheck her lipids while patient on atorvastatin.  Patient was called and notified of medication changes and her 10-01-16 appointment was rescheduled for 12-11-16.

## 2016-09-17 ENCOUNTER — Other Ambulatory Visit: Payer: Self-pay | Admitting: Physician Assistant

## 2016-09-17 DIAGNOSIS — E785 Hyperlipidemia, unspecified: Secondary | ICD-10-CM

## 2016-09-23 NOTE — Assessment & Plan Note (Deleted)
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  Anticoagulated with Vitamin K antagonist since 2014 without recurrence of VTE.  Now on Xarelto 20 mg daily with transition to Xarelto occurring on 08/01/2016.  She was a no-show for her 09/02/2016 appointment.  She is provided recent information regarding the growing data surrounding unprovoked VTE: Unprovoked proximal DVT and symptomatic PE-The decision regarding the use of indefinite anticoagulation in patients with a first episode of unprovoked proximal deep vein thrombosis (DVT), unprovoked symptomatic pulmonary embolism (PE), or patients with active cancer is dependent upon patient-specific bleeding and thrombotic risks as well as the patient's values and preferences. For those with a low to moderate bleeding risk, we suggest indefinite treatment rather than treatment for three to six months. For patients with a high bleeding risk, the benefits of indefinite anticoagulation are likely outweighed by the high risk of bleeding such that indefinite anticoagulation is not generally performed.  The rationale for indefinite anticoagulation in patients with an unprovoked proximal DVT or symptomatic PE is based upon the high estimated lifetime risk of recurrent VTE, which can be dramatically reduced by extending anticoagulation beyond the conventional three to six months. Importantly, most studies report a risk reduction in the rate of VTE recurrence at the expense of an increased rate of bleeding and without mortality benefit.  The estimated risk of recurrence following cessation of anticoagulation in patients with a first unprovoked episode of VTE is 10 percent at one year and 30 percent at five years (approximately 5 percent per year after the first year). Prior duration of therapy does not affect the risk of recurrence once anticoagulant therapy is discontinued. Full anticoagulation is associated with an over 90  percent reduction in the rate of recurrence compared with low-intensity anticoagulant regimens and aspirin which are less effective (approximately 60 and 30 percent rate reduction in recurrence respectively). This decrease in recurrence outweighs the rate of bleeding with full anticoagulation in most patients who are estimated to have a low (0.8 percent per year) or intermediate (1.6 percent per year) bleeding risk.  Labs today: CBC diff, BMET, ferritin.  I personally reviewed and went over laboratory results with the patient.  The results are noted within this dictation.  Return in 6 months for follow-up.  If all is well at that time, annual follow-up would be reasonable with ongoing anticoagulation therapy.

## 2016-09-23 NOTE — Progress Notes (Signed)
NO SHOW  ROS  

## 2016-09-24 ENCOUNTER — Ambulatory Visit (HOSPITAL_COMMUNITY): Payer: Self-pay | Admitting: Oncology

## 2016-09-29 ENCOUNTER — Encounter (HOSPITAL_COMMUNITY): Payer: Self-pay | Admitting: Oncology

## 2016-10-01 ENCOUNTER — Ambulatory Visit: Payer: Self-pay | Admitting: Physician Assistant

## 2016-10-16 ENCOUNTER — Encounter: Payer: Self-pay | Admitting: Physician Assistant

## 2016-10-16 NOTE — Progress Notes (Signed)
[facility] 08-01-16 [facility]. Pt was scheduled with the program to be seen on 08-29-16 at 10 AM with Crist FatMarnie Atkins, RN.  Per Lorelle FormosaMarnie pt no showed to that appointment.

## 2016-10-22 ENCOUNTER — Inpatient Hospital Stay (HOSPITAL_COMMUNITY)
Admission: EM | Admit: 2016-10-22 | Discharge: 2016-10-23 | DRG: 343 | Disposition: A | Discharge status: Home / Self Care | Payer: Medicaid Other | Attending: General Surgery | Admitting: General Surgery

## 2016-10-22 ENCOUNTER — Emergency Department (HOSPITAL_COMMUNITY): Payer: Medicaid Other

## 2016-10-22 ENCOUNTER — Encounter (HOSPITAL_COMMUNITY): Payer: Self-pay | Admitting: Emergency Medicine

## 2016-10-22 DIAGNOSIS — K3589 Other acute appendicitis: Secondary | ICD-10-CM | POA: Diagnosis present

## 2016-10-22 DIAGNOSIS — K3580 Unspecified acute appendicitis: Secondary | ICD-10-CM | POA: Diagnosis present

## 2016-10-22 DIAGNOSIS — R791 Abnormal coagulation profile: Secondary | ICD-10-CM | POA: Diagnosis present

## 2016-10-22 DIAGNOSIS — Z86711 Personal history of pulmonary embolism: Secondary | ICD-10-CM | POA: Diagnosis not present

## 2016-10-22 DIAGNOSIS — Z7901 Long term (current) use of anticoagulants: Secondary | ICD-10-CM

## 2016-10-22 DIAGNOSIS — Z86718 Personal history of other venous thrombosis and embolism: Secondary | ICD-10-CM | POA: Diagnosis not present

## 2016-10-22 DIAGNOSIS — Z79899 Other long term (current) drug therapy: Secondary | ICD-10-CM

## 2016-10-22 LAB — COMPREHENSIVE METABOLIC PANEL
ALT: 21 U/L (ref 14–54)
AST: 22 U/L (ref 15–41)
Albumin: 3.7 g/dL (ref 3.5–5.0)
Alkaline Phosphatase: 65 U/L (ref 38–126)
Anion gap: 8 (ref 5–15)
BUN: 13 mg/dL (ref 6–20)
CO2: 28 mmol/L (ref 22–32)
Calcium: 8.9 mg/dL (ref 8.9–10.3)
Chloride: 99 mmol/L — ABNORMAL LOW (ref 101–111)
Creatinine, Ser: 0.6 mg/dL (ref 0.44–1.00)
GFR calc Af Amer: >60 mL/min (ref >60)
GFR calc non Af Amer: >60 mL/min (ref >60)
Glucose, Bld: 106 mg/dL — ABNORMAL HIGH (ref 65–99)
Potassium: 3.8 mmol/L (ref 3.5–5.1)
Sodium: 135 mmol/L (ref 135–145)
Total Bilirubin: 0.6 mg/dL (ref 0.3–1.2)
Total Protein: 7.2 g/dL (ref 6.5–8.1)

## 2016-10-22 LAB — CBC
HCT: 40 % (ref 36.0–46.0)
Hemoglobin: 13.3 g/dL (ref 12.0–15.0)
MCH: 28.9 pg (ref 26.0–34.0)
MCHC: 33.3 g/dL (ref 30.0–36.0)
MCV: 86.8 fL (ref 78.0–100.0)
Platelets: 326 10*3/uL (ref 150–400)
RBC: 4.61 MIL/uL (ref 3.87–5.11)
RDW: 12.7 % (ref 11.5–15.5)
WBC: 13.1 10*3/uL — ABNORMAL HIGH (ref 4.0–10.5)

## 2016-10-22 LAB — URINALYSIS, MICROSCOPIC (REFLEX)
BACTERIA UA: NONE SEEN
WBC UA: NONE SEEN WBC/hpf (ref 0–5)

## 2016-10-22 LAB — URINALYSIS, ROUTINE W REFLEX MICROSCOPIC
Bilirubin Urine: NEGATIVE
Glucose, UA: NEGATIVE mg/dL
Hgb urine dipstick: NEGATIVE
Ketones, ur: NEGATIVE mg/dL
Leukocytes, UA: NEGATIVE
Nitrite: NEGATIVE
Specific Gravity, Urine: >1.03 — ABNORMAL HIGH (ref 1.005–1.030)
pH: 5.5 (ref 5.0–8.0)

## 2016-10-22 LAB — PREGNANCY, URINE: Preg Test, Ur: NEGATIVE

## 2016-10-22 LAB — LIPASE, BLOOD: Lipase: 20 U/L (ref 11–51)

## 2016-10-22 MED ORDER — ACETAMINOPHEN 325 MG PO TABS: 650.0000 mg | ORAL_TABLET | Freq: Four times a day (QID) | ORAL | Status: DC | PRN

## 2016-10-22 MED ORDER — HYDROMORPHONE HCL 1 MG/ML IJ SOLN
1.0000 mg | Freq: Once | INTRAMUSCULAR | Status: AC
  Administered 2016-10-22: 1 mg via INTRAVENOUS
  Filled 2016-10-22: qty 1

## 2016-10-22 MED ORDER — LACTATED RINGERS IV SOLN
INTRAVENOUS | Status: DC
  Administered 2016-10-22: 19:00:00 via INTRAVENOUS

## 2016-10-22 MED ORDER — HYDROMORPHONE HCL 1 MG/ML IJ SOLN
1.0000 mg | INTRAMUSCULAR | Status: DC | PRN
  Administered 2016-10-22 – 2016-10-23 (×7): 1 mg via INTRAVENOUS
  Filled 2016-10-22 (×7): qty 1

## 2016-10-22 MED ORDER — SIMETHICONE 80 MG PO CHEW: 40.0000 mg | CHEWABLE_TABLET | Freq: Four times a day (QID) | ORAL | Status: DC | PRN

## 2016-10-22 MED ORDER — CHLORHEXIDINE GLUCONATE CLOTH 2 % EX PADS
6.0000 | MEDICATED_PAD | Freq: Once | CUTANEOUS | Status: AC
  Administered 2016-10-23: 6 via TOPICAL

## 2016-10-22 MED ORDER — ALUM & MAG HYDROXIDE-SIMETH 200-200-20 MG/5ML PO SUSP
30.0000 mL | ORAL | Status: DC | PRN
  Administered 2016-10-23: 30 mL via ORAL
  Filled 2016-10-22: qty 30

## 2016-10-22 MED ORDER — PIPERACILLIN-TAZOBACTAM 3.375 G IVPB
3.3750 g | Freq: Three times a day (TID) | INTRAVENOUS | Status: DC
  Administered 2016-10-22 – 2016-10-23 (×2): 3.375 g via INTRAVENOUS
  Filled 2016-10-22 (×2): qty 50

## 2016-10-22 MED ORDER — ONDANSETRON 4 MG PO TBDP: 4.0000 mg | ORAL_TABLET | Freq: Four times a day (QID) | ORAL | Status: DC | PRN

## 2016-10-22 MED ORDER — IOPAMIDOL (ISOVUE-300) INJECTION 61%
100.0000 mL | Freq: Once | INTRAVENOUS | Status: AC | PRN
  Administered 2016-10-22: 100 mL via INTRAVENOUS

## 2016-10-22 MED ORDER — ALBUTEROL SULFATE (2.5 MG/3ML) 0.083% IN NEBU: 2.5000 mg | INHALATION_SOLUTION | Freq: Four times a day (QID) | RESPIRATORY_TRACT | Status: DC | PRN

## 2016-10-22 MED ORDER — PIPERACILLIN-TAZOBACTAM 3.375 G IVPB: 3.3750 g | Freq: Three times a day (TID) | INTRAVENOUS | Status: DC

## 2016-10-22 MED ORDER — LORAZEPAM 2 MG/ML IJ SOLN
1.0000 mg | INTRAMUSCULAR | Status: DC | PRN
  Administered 2016-10-22: 1 mg via INTRAVENOUS
  Filled 2016-10-22: qty 1

## 2016-10-22 MED ORDER — ZOLPIDEM TARTRATE 5 MG PO TABS
5.0000 mg | ORAL_TABLET | Freq: Every evening | ORAL | Status: DC | PRN
  Administered 2016-10-22: 5 mg via ORAL
  Filled 2016-10-22: qty 1

## 2016-10-22 MED ORDER — ACETAMINOPHEN 650 MG RE SUPP: 650.0000 mg | Freq: Four times a day (QID) | RECTAL | Status: DC | PRN

## 2016-10-22 MED ORDER — PIPERACILLIN-TAZOBACTAM 3.375 G IVPB 30 MIN
3.3750 g | Freq: Once | INTRAVENOUS | Status: AC
  Administered 2016-10-22: 3.375 g via INTRAVENOUS
  Filled 2016-10-22: qty 50

## 2016-10-22 MED ORDER — CHLORHEXIDINE GLUCONATE CLOTH 2 % EX PADS
6.0000 | MEDICATED_PAD | Freq: Once | CUTANEOUS | Status: AC
  Administered 2016-10-22: 6 via TOPICAL

## 2016-10-22 MED ORDER — MORPHINE SULFATE (PF) 4 MG/ML IV SOLN
4.0000 mg | INTRAVENOUS | Status: DC | PRN
  Administered 2016-10-22: 4 mg via INTRAVENOUS
  Filled 2016-10-22 (×2): qty 1

## 2016-10-22 MED ORDER — IOPAMIDOL (ISOVUE-300) INJECTION 61%
INTRAVENOUS | Status: AC
  Administered 2016-10-22: 15 mL
  Filled 2016-10-22: qty 30

## 2016-10-22 MED ORDER — ONDANSETRON HCL 4 MG/2ML IJ SOLN
4.0000 mg | Freq: Four times a day (QID) | INTRAMUSCULAR | Status: DC | PRN
  Administered 2016-10-22 – 2016-10-23 (×2): 4 mg via INTRAVENOUS
  Filled 2016-10-22 (×2): qty 2

## 2016-10-22 MED ORDER — SODIUM CHLORIDE 0.9 % IV BOLUS (SEPSIS)
1000.0000 mL | Freq: Once | INTRAVENOUS | Status: AC
  Administered 2016-10-22: 1000 mL via INTRAVENOUS

## 2016-10-22 NOTE — ED Triage Notes (Signed)
Patient complaining of abdominal pain x 2 days. Denies vomiting or diarrhea. 

## 2016-10-22 NOTE — H&P (Signed)
Melanie Mendoza is an 46 y.o. female.   Chief Complaint: Right lower quadrant abdominal pain HPI: Patient is a 46 year old white female who presented to the emergency room with a 2 day history of worsening right-sided abdominal pain. Decreased appetite and nausea been noted. No vomiting has been noted. No diarrhea has been noted. Pain is 3 out of 10. CT scan the abdomen revealed acute appendicitis without perforation. Of note is the fact the patient is on xeralto for DVT/PE. She had been on Coumadin, but was recently switched. She last took the drug on 10/21/2016.  Past Medical History:  Diagnosis Date  . Acute pulmonary embolism ([place]) 09/13/2013  . Asthma   . Bilateral ovarian cysts   . PE (pulmonary embolism) 09/2013  . Polysubstance abuse   . Ulcerative colitis ([place])     History reviewed. No pertinent surgical history.  Family History  Problem Relation Age of Onset  . Lung cancer Mother   . Cancer Mother    Social History:  reports that she has never smoked. She has never used smokeless tobacco. She reports that she does not drink alcohol or use drugs.  Allergies: No Known Allergies   (Not in a hospital admission)  Results for orders placed or performed during the hospital encounter of 10/22/16 (from the past 48 hour(s))  Lipase, blood     Status: None   Collection Time: 10/22/16 11:54 AM  Result Value Ref Range   Lipase 20 11 - 51 U/L  Comprehensive metabolic panel     Status: Abnormal   Collection Time: 10/22/16 11:54 AM  Result Value Ref Range   Sodium 135 135 - 145 mmol/L   Potassium 3.8 3.5 - 5.1 mmol/L   Chloride 99 (L) 101 - 111 mmol/L   CO2 28 22 - 32 mmol/L   Glucose, Bld 106 (H) 65 - 99 mg/dL   BUN 13 6 - 20 mg/dL   Creatinine, Ser 0.60 0.44 - 1.00 mg/dL   Calcium 8.9 8.9 - 10.3 mg/dL   Total Protein 7.2 6.5 - 8.1 g/dL   Albumin 3.7 3.5 - 5.0 g/dL   AST 22 15 - 41 U/L   ALT 21 14 - 54 U/L   Alkaline Phosphatase 65 38 - 126 U/L   Total Bilirubin 0.6 0.3 - 1.2  mg/dL   GFR calc non Af Amer >60 >60 mL/min   GFR calc Af Amer >60 >60 mL/min    Comment: (NOTE) The eGFR has been calculated using the CKD EPI equation. This calculation has not been validated in all clinical situations. eGFR's persistently <60 mL/min signify possible Chronic Kidney Disease.    Anion gap 8 5 - 15  CBC     Status: Abnormal   Collection Time: 10/22/16 11:54 AM  Result Value Ref Range   WBC 13.1 (H) 4.0 - 10.5 K/uL   RBC 4.61 3.87 - 5.11 MIL/uL   Hemoglobin 13.3 12.0 - 15.0 g/dL   HCT 40.0 36.0 - 46.0 %   MCV 86.8 78.0 - 100.0 fL   MCH 28.9 26.0 - 34.0 pg   MCHC 33.3 30.0 - 36.0 g/dL   RDW 12.7 11.5 - 15.5 %   Platelets 326 150 - 400 K/uL  Urinalysis, Routine w reflex microscopic     Status: Abnormal   Collection Time: 10/22/16 11:54 AM  Result Value Ref Range   Color, Urine YELLOW YELLOW   APPearance HAZY (A) CLEAR   Specific Gravity, Urine >1.030 (H) 1.005 - 1.030  pH 5.5 5.0 - 8.0   Glucose, UA NEGATIVE NEGATIVE mg/dL   Hgb urine dipstick NEGATIVE NEGATIVE   Bilirubin Urine NEGATIVE NEGATIVE   Ketones, ur NEGATIVE NEGATIVE mg/dL   Protein, ur TRACE (A) NEGATIVE mg/dL   Nitrite NEGATIVE NEGATIVE   Leukocytes, UA NEGATIVE NEGATIVE  Pregnancy, urine     Status: None   Collection Time: 10/22/16 11:54 AM  Result Value Ref Range   Preg Test, Ur NEGATIVE NEGATIVE  Urinalysis, Microscopic (reflex)     Status: Abnormal   Collection Time: 10/22/16 11:54 AM  Result Value Ref Range   RBC / HPF 0-5 0 - 5 RBC/hpf   WBC, UA NONE SEEN 0 - 5 WBC/hpf   Bacteria, UA NONE SEEN NONE SEEN   Squamous Epithelial / LPF 0-5 (A) NONE SEEN   Ct Abdomen Pelvis W Contrast  Result Date: 10/22/2016 CLINICAL DATA:  46 year old female with diffuse abdominal pain and distention for 2 days. History of ulcer colitis. Polysubstance abuse. Ovarian cyst. Initial encounter. EXAM: CT ABDOMEN AND PELVIS WITH CONTRAST TECHNIQUE: Multidetector CT imaging of the abdomen and pelvis was performed  using the standard protocol following bolus administration of intravenous contrast. CONTRAST:  19m ISOVUE-300 IOPAMIDOL (ISOVUE-300) INJECTION 61%, 1068mISOVUE-300 IOPAMIDOL (ISOVUE-300) INJECTION 61% COMPARISON:  11/28/2004 CT abdomen and pelvis.  07/03/2016 chest CT. FINDINGS: Lower chest: Minimal linear scarring/ atelectasis. Low-density structure surrounds the inferior aspect of the right lower lobe pulmonary vein. This is new compared to prior CT although incompletely assessed and can be evaluated on follow-up chest CT. Hepatobiliary: No hepatic lesion or calcified gallstone. Pancreas: No pancreatic lesion or inflammation. Spleen: No mass or enlargement. Adrenals/Urinary Tract: No renal or adrenal mass. Lobulated contour probably congenital rather than scarring. No renal or ureteral obstructing stone or hydronephrosis. Stomach/Bowel: Diffuse enlarged inflamed appendix without deep drainable abscess. The appendix extends posteriorly and inferiorly. The terminal ileum is slightly irregular however the inflammatory process is centered at the level of the appendix. Scattered colonic diverticula most notable descending colon sigmoid colon without surrounding inflammation. Evaluation of bowel limited by under distension. Fat within the wall of may reflect changes of prior colitis. Vascular/Lymphatic: No abdominal aortic aneurysm or large vessel occlusion. Scattered small lymph nodes without adenopathy. Reproductive: Slightly lobulated contour of the uterus suggestive of small fibroids. Other: No free air. Musculoskeletal: Degenerative changes L3-4 IMPRESSION: Diffuse enlarged inflamed appendix without deep drainable abscess. The appendix extends posteriorly and inferiorly. Terminal ileum is slightly irregular however the inflammatory process is centered at the level of the appendix. Colonic diverticulosis. Low-density structure surrounds the inferior aspect of the right lower lobe pulmonary vein. This is new  compared to prior CT although incompletely assessed and can be evaluated on follow-up chest CT. Slightly lobulated contour of the uterus suggestive of small fibroids. These results were called by telephone at the time of interpretation on 10/22/2016 at 3:01 pm to Dr. STVirgel Manifold who verbally acknowledged these results. Electronically Signed   By: StGenia Del.D.   On: 10/22/2016 15:15    Review of Systems  Constitutional: Positive for malaise/fatigue.  HENT: Negative.   Eyes: Negative.   Respiratory: Negative.   Cardiovascular: Negative.   Gastrointestinal: Positive for abdominal pain and nausea.  Genitourinary: Negative.   Musculoskeletal: Negative.   Skin: Negative.   Endo/Heme/Allergies: Bruises/bleeds easily.  All other systems reviewed and are negative.   Blood pressure 103/75, pulse 84, temperature 97.9 F (36.6 C), temperature source Oral, resp. rate 14, height '5\' 7"'  (1.702  m), weight 86.2 kg (190 lb), last menstrual period 09/12/2016, SpO2 95 %. Physical Exam  Vitals reviewed. Constitutional: She is oriented to person, place, and time. She appears well-developed and well-nourished.  HENT:  Head: Normocephalic and atraumatic.  Neck: Normal range of motion. Neck supple.  Cardiovascular: Normal rate, regular rhythm and normal heart sounds.   Respiratory: Effort normal and breath sounds normal.  GI: Soft. She exhibits no distension. There is tenderness.  Tender in the right lower quadrant to palpation. No rigidity noted.  Neurological: She is alert and oriented to person, place, and time.  Skin: Skin is warm.     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 Due to her Alen Blew use. Patient understands that this could increase her risk of perforation. The risks and benefits of the procedure including bleeding, infection, and the  possibility of an open procedure were fully explained to the patient, who gave informed consent.  Jamesetta So, MD 10/22/2016, 6:08 PM

## 2016-10-22 NOTE — ED Notes (Signed)
Pt requesting pain medication, MD Kohut notified.

## 2016-10-22 NOTE — ED Notes (Signed)
Per secretary MD Lovell SheehanJenkins is on his way and pt can have a clear liquid diet.

## 2016-10-22 NOTE — ED Notes (Signed)
Pt reports she has UC, no BM for two days. Used an enema yesterday with results today. Painful BM.

## 2016-10-22 NOTE — ED Provider Notes (Addendum)
[facility] DEPT Provider Note   CSN: [id number] Arrival date & time: 10/22/16  1146     History   Chief Complaint Chief Complaint  Patient presents with  . Abdominal Pain    HPI AVAROSE MERVINE is a 46 y.o. female.  HPI   46 year old female with abdominal pain. Onset about 2 days ago. Significantly worse last night. Pain is across lower abdomen. Doesn't particularly lateralize. Worse with movements and with bowel movements. She reports some blood in her stool but she has a history of ulcerative colitis and says that this is not unusual for her. This feels different than the typical symptoms she gets with ulcerative colitis. No fevers or chills. No nausea. No urinary complaints. No unusual vaginal bleeding or discharge. Denies any past abdominal surgical history.  Past Medical History:  Diagnosis Date  . Acute pulmonary embolism (HCC) 09/13/2013  . Asthma   . Bilateral ovarian cysts   . PE (pulmonary embolism) 09/2013  . Polysubstance abuse   . Ulcerative colitis [facility])     Patient Active Problem List   Diagnosis Date Noted  . Asthma exacerbation 07/03/2016  . Hyperlipidemia 07/01/2016  . Prediabetes 07/01/2016  . Long term (current) use of anticoagulants [Z79.01] 05/08/2016  . On anticoagulant therapy 04/16/2016  . Obesity, unspecified 04/16/2016  . Manipulative behavior 09/13/2013  . Acute pulmonary embolism (HCC) 09/13/2013  . Asthma with bronchitis 09/08/2013  . Polysubstance abuse 09/08/2013  . Narcotic abuse 09/08/2013  . Hypoxia 09/08/2013  . DVT of leg (deep venous thrombosis) (HCC) 09/08/2013  . Anemia 09/08/2013  . Overdose of benzodiazepine 08/20/2013  . Acute encephalopathy 08/20/2013  . Acute respiratory failure (HCC) 08/20/2013  . Asthma 08/20/2013  . Leukocytosis 08/20/2013    History reviewed. No pertinent surgical history.  OB History    Gravida Para Term Preterm AB Living             2   SAB TAB Ectopic Multiple Live Births                    Home Medications    Prior to Admission medications   Medication Sig Start Date End Date Taking? Authorizing Provider  acetaminophen (TYLENOL) 500 MG tablet Take 1,000 mg by mouth every 6 (six) hours as needed for moderate pain.   Yes Historical Provider, MD  albuterol (PROVENTIL HFA;VENTOLIN HFA) 108 (90 Base) MCG/ACT inhaler Inhale 2 puffs into the lungs every 4 (four) hours as needed for wheezing or shortness of breath. 08/28/16  Yes Jacquelin Hawking, PA-C  albuterol (PROVENTIL) (2.5 MG/3ML) 0.083% nebulizer solution Take 3 mLs (2.5 mg total) by nebulization every 6 (six) hours as needed for wheezing or shortness of breath. 09/10/16  Yes Jacquelin Hawking, PA-C  atorvastatin (LIPITOR) 20 MG tablet Take 1 tablet (20 mg total) by mouth daily. 09/10/16  Yes Jacquelin Hawking, PA-C  fluticasone (FLOVENT HFA) 110 MCG/ACT inhaler Inhale 2 puffs into the lungs daily. 07/04/16  Yes Estela Isaiah Blakes, MD  mometasone-formoterol (DULERA) 100-5 MCG/ACT AERO Inhale 2 puffs into the lungs 2 (two) times daily. 09/10/16  Yes Jacquelin Hawking, PA-C  rivaroxaban (XARELTO) 20 MG TABS tablet Take 1 tablet (20 mg total) by mouth daily with supper. 07/30/16  Yes Maurine Minister Kefalas, PA-C  sulfaSALAzine (AZULFIDINE) 500 MG tablet Take 500 mg by mouth 2 (two) times daily.    Yes Historical Provider, MD  lovastatin (MEVACOR) 20 MG tablet Take 1 tablet (20 mg total) by mouth at bedtime. Patient not  taking: Reported on 10/22/2016 07/01/16   Jacquelin Hawking, PA-C    Family History Family History  Problem Relation Age of Onset  . Lung cancer Mother   . Cancer Mother     Social History Social History  Substance Use Topics  . Smoking status: Never Smoker  . Smokeless tobacco: Never Used  . Alcohol use No     Allergies   Patient has no known allergies.   Review of Systems Review of Systems   All systems reviewed and negative, other than as noted in HPI.   Physical Exam Updated Vital Signs BP 114/78    Pulse 77   Temp 97.9 F (36.6 C) (Oral)   Resp 16   Ht 5\' 7"  (1.702 m)   Wt 190 lb (86.2 kg)   LMP 09/12/2016   SpO2 98%   BMI 29.76 kg/m   Physical Exam  Constitutional: She appears well-developed and well-nourished. No distress.  HENT:  Head: Normocephalic and atraumatic.  Eyes: Conjunctivae are normal. Right eye exhibits no discharge. Left eye exhibits no discharge.  Neck: Neck supple.  Cardiovascular: Normal rate, regular rhythm and normal heart sounds.  Exam reveals no gallop and no friction rub.   No murmur heard. Pulmonary/Chest: Effort normal and breath sounds normal. No respiratory distress.  Abdominal: Soft. There is tenderness.  TTP suprapubically and in RLQ w/o rebound or guarding  Musculoskeletal: She exhibits no edema or tenderness.  Neurological: She is alert.  Skin: Skin is warm and dry.  Psychiatric: She has a normal mood and affect. Her behavior is normal. Thought content normal.  Nursing note and vitals reviewed.    ED Treatments / Results  Labs (all labs ordered are listed, but only abnormal results are displayed) Labs Reviewed  COMPREHENSIVE METABOLIC PANEL - Abnormal; Notable for the following:       Result Value   Chloride 99 (*)    Glucose, Bld 106 (*)    All other components within normal limits  CBC - Abnormal; Notable for the following:    WBC 13.1 (*)    All other components within normal limits  URINALYSIS, ROUTINE W REFLEX MICROSCOPIC - Abnormal; Notable for the following:    APPearance HAZY (*)    Specific Gravity, Urine >1.030 (*)    Protein, ur TRACE (*)    All other components within normal limits  URINALYSIS, MICROSCOPIC (REFLEX) - Abnormal; Notable for the following:    Squamous Epithelial / LPF 0-5 (*)    All other components within normal limits  LIPASE, BLOOD  PREGNANCY, URINE    EKG  EKG Interpretation None       Radiology Ct Abdomen Pelvis W Contrast  Result Date: 10/22/2016 CLINICAL DATA:  46 year old female  with diffuse abdominal pain and distention for 2 days. History of ulcer colitis. Polysubstance abuse. Ovarian cyst. Initial encounter. EXAM: CT ABDOMEN AND PELVIS WITH CONTRAST TECHNIQUE: Multidetector CT imaging of the abdomen and pelvis was performed using the standard protocol following bolus administration of intravenous contrast. CONTRAST:  15mL ISOVUE-300 IOPAMIDOL (ISOVUE-300) INJECTION 61%, ISOVUE-300 IOPAMIDOL (ISOVUE-300) INJECTION 61% COMPARISON:  11/28/2004 CT abdomen and pelvis.  07/03/2016 chest CT. FINDINGS: Lower chest: Minimal linear scarring/ atelectasis. Low-density structure surrounds the inferior aspect of the right lower lobe pulmonary vein. This is new compared to prior CT although incompletely assessed and can be evaluated on follow-up chest CT. Hepatobiliary: No hepatic lesion or calcified gallstone. Pancreas: No pancreatic lesion or inflammation. Spleen: No mass or enlargement. Adrenals/Urinary Tract: No  renal or adrenal mass. Lobulated contour probably congenital rather than scarring. No renal or ureteral obstructing stone or hydronephrosis. Stomach/Bowel: Diffuse enlarged inflamed appendix without deep drainable abscess. The appendix extends posteriorly and inferiorly. The terminal ileum is slightly irregular however the inflammatory process is centered at the level of the appendix. Scattered colonic diverticula most notable descending colon sigmoid colon without surrounding inflammation. Evaluation of bowel limited by under distension. Fat within the wall of may reflect changes of prior colitis. Vascular/Lymphatic: No abdominal aortic aneurysm or large vessel occlusion. Scattered small lymph nodes without adenopathy. Reproductive: Slightly lobulated contour of the uterus suggestive of small fibroids. Other: No free air. Musculoskeletal: Degenerative changes L3-4 IMPRESSION: Diffuse enlarged inflamed appendix without deep drainable abscess. The appendix extends posteriorly and  inferiorly. Terminal ileum is slightly irregular however the inflammatory process is centered at the level of the appendix. Colonic diverticulosis. Low-density structure surrounds the inferior aspect of the right lower lobe pulmonary vein. This is new compared to prior CT although incompletely assessed and can be evaluated on follow-up chest CT. Slightly lobulated contour of the uterus suggestive of small fibroids. These results were called by telephone at the time of interpretation on 10/22/2016 at 3:01 pm to Dr. Raeford RazorSTEPHEN Rama Sorci , who verbally acknowledged these results. Electronically Signed   By: Lacy DuverneySteven  Olson M.D.   On: 10/22/2016 15:15    Procedures Procedures (including critical care time)  Medications Ordered in ED Medications  HYDROmorphone (DILAUDID) injection 1 mg (not administered)  HYDROmorphone (DILAUDID) injection 1 mg (1 mg Intravenous Given 10/22/16 1256)  sodium chloride 0.9 % bolus 1,000 mL (1,000 mLs Intravenous New Bag/Given 10/22/16 1256)  iopamidol (ISOVUE-300) 61 % injection (15 mLs  Contrast Given 10/22/16 1249)  iopamidol (ISOVUE-300) 61 % injection 100 mL (100 mLs Intravenous Contrast Given 10/22/16 1440)     Initial Impression / Assessment and Plan / ED Course  I have reviewed the triage vital signs and the nursing notes.  Pertinent labs & imaging results that were available during my care of the patient were reviewed by me and considered in my medical decision making (see chart for details).  Clinical Course     46 year old female with lower abdominal pain for the past 2 days. She is significantly tender suprapubically and in the right lower quadrant. CT is significant for diverticulitis without perforation or abscess. Antibiotics. Surgical consultation.  3:28 PM Discussed with Dr Lovell SheehanJenkins, surgery. With xarelto usage, she won't be going to OR today. Clears fine for diet today.   Final Clinical Impressions(s) / ED Diagnoses   Final diagnoses:  Acute appendicitis,  unspecified acute appendicitis type    New Prescriptions New Prescriptions   No medications on file     Raeford RazorStephen Keyonia Gluth, MD 10/22/16 1523    Raeford RazorStephen Leonte Horrigan, MD 10/25/16 1444

## 2016-10-23 ENCOUNTER — Inpatient Hospital Stay (HOSPITAL_COMMUNITY): Payer: Medicaid Other | Admitting: Anesthesiology

## 2016-10-23 ENCOUNTER — Encounter (HOSPITAL_COMMUNITY)
Admission: EM | Disposition: A | Discharge status: Home / Self Care | Payer: Self-pay | Source: Home / Self Care | Attending: General Surgery

## 2016-10-23 ENCOUNTER — Encounter (HOSPITAL_COMMUNITY): Payer: Self-pay | Admitting: *Deleted

## 2016-10-23 HISTORY — PX: LAPAROSCOPIC APPENDECTOMY: SHX408

## 2016-10-23 LAB — GLUCOSE, CAPILLARY: GLUCOSE-CAPILLARY: 104 mg/dL — AB (ref 65–99)

## 2016-10-23 LAB — SURGICAL PCR SCREEN
MRSA, PCR: NEGATIVE
STAPHYLOCOCCUS AUREUS: NEGATIVE

## 2016-10-23 SURGERY — APPENDECTOMY, LAPAROSCOPIC
Anesthesia: General

## 2016-10-23 MED ORDER — KETOROLAC TROMETHAMINE 30 MG/ML IJ SOLN
30.0000 mg | Freq: Once | INTRAMUSCULAR | Status: AC
  Administered 2016-10-23: 30 mg via INTRAVENOUS
  Filled 2016-10-23: qty 1

## 2016-10-23 MED ORDER — SUCCINYLCHOLINE CHLORIDE 20 MG/ML IJ SOLN
INTRAMUSCULAR | Status: DC | PRN
  Administered 2016-10-23: 140 mg via INTRAVENOUS

## 2016-10-23 MED ORDER — GLYCOPYRROLATE 0.2 MG/ML IJ SOLN
INTRAMUSCULAR | Status: AC
  Filled 2016-10-23: qty 4

## 2016-10-23 MED ORDER — SUCCINYLCHOLINE CHLORIDE 20 MG/ML IJ SOLN
INTRAMUSCULAR | Status: AC
  Filled 2016-10-23: qty 1

## 2016-10-23 MED ORDER — DIPHENHYDRAMINE HCL 25 MG PO CAPS: 25.0000 mg | ORAL_CAPSULE | Freq: Four times a day (QID) | ORAL | Status: DC | PRN

## 2016-10-23 MED ORDER — POVIDONE-IODINE 10 % EX OINT
TOPICAL_OINTMENT | CUTANEOUS | Status: AC
  Filled 2016-10-23: qty 1

## 2016-10-23 MED ORDER — BUPIVACAINE HCL (PF) 0.5 % IJ SOLN
INTRAMUSCULAR | Status: DC | PRN
  Administered 2016-10-23: 10 mL

## 2016-10-23 MED ORDER — LACTATED RINGERS IV SOLN: INTRAVENOUS | Status: DC

## 2016-10-23 MED ORDER — MIDAZOLAM HCL 2 MG/2ML IJ SOLN
1.0000 mg | INTRAMUSCULAR | Status: DC | PRN
  Administered 2016-10-23: 2 mg via INTRAVENOUS

## 2016-10-23 MED ORDER — LIDOCAINE HCL (CARDIAC) 10 MG/ML IV SOLN
INTRAVENOUS | Status: DC | PRN
  Administered 2016-10-23: 50 mg via INTRAVENOUS

## 2016-10-23 MED ORDER — BUPIVACAINE HCL (PF) 0.5 % IJ SOLN
INTRAMUSCULAR | Status: AC
  Filled 2016-10-23: qty 30

## 2016-10-23 MED ORDER — ONDANSETRON HCL 4 MG/2ML IJ SOLN
INTRAMUSCULAR | Status: AC
  Filled 2016-10-23: qty 2

## 2016-10-23 MED ORDER — ROCURONIUM BROMIDE 50 MG/5ML IV SOLN
INTRAVENOUS | Status: AC
  Filled 2016-10-23: qty 1

## 2016-10-23 MED ORDER — GLYCOPYRROLATE 0.2 MG/ML IJ SOLN
INTRAMUSCULAR | Status: DC | PRN
  Administered 2016-10-23: 0.6 mg via INTRAVENOUS

## 2016-10-23 MED ORDER — OXYCODONE-ACETAMINOPHEN 7.5-325 MG PO TABS: 1.0000 | ORAL_TABLET | ORAL | 0 refills | Status: DC | PRN

## 2016-10-23 MED ORDER — HYDROMORPHONE HCL 1 MG/ML IJ SOLN
0.2500 mg | INTRAMUSCULAR | Status: DC | PRN
  Administered 2016-10-23: 0.5 mg via INTRAVENOUS
  Filled 2016-10-23: qty 0.5

## 2016-10-23 MED ORDER — ROCURONIUM BROMIDE 100 MG/10ML IV SOLN
INTRAVENOUS | Status: DC | PRN
  Administered 2016-10-23: 5 mg via INTRAVENOUS
  Administered 2016-10-23: 20 mg via INTRAVENOUS

## 2016-10-23 MED ORDER — DIPHENHYDRAMINE HCL 50 MG/ML IJ SOLN: 25.0000 mg | Freq: Four times a day (QID) | INTRAMUSCULAR | Status: DC | PRN

## 2016-10-23 MED ORDER — GLYCOPYRROLATE 0.2 MG/ML IJ SOLN
0.2000 mg | Freq: Once | INTRAMUSCULAR | Status: AC
  Administered 2016-10-23: 0.2 mg via INTRAVENOUS

## 2016-10-23 MED ORDER — ONDANSETRON HCL 4 MG/2ML IJ SOLN
4.0000 mg | Freq: Four times a day (QID) | INTRAMUSCULAR | Status: DC | PRN
  Administered 2016-10-23: 4 mg via INTRAVENOUS
  Filled 2016-10-23: qty 2

## 2016-10-23 MED ORDER — SODIUM CHLORIDE 0.9 % IR SOLN
Status: DC | PRN
  Administered 2016-10-23: 1000 mL

## 2016-10-23 MED ORDER — PIPERACILLIN-TAZOBACTAM 3.375 G IVPB: 3.3750 g | Freq: Three times a day (TID) | INTRAVENOUS | Status: DC

## 2016-10-23 MED ORDER — LIDOCAINE HCL (PF) 1 % IJ SOLN
INTRAMUSCULAR | Status: AC
  Filled 2016-10-23: qty 5

## 2016-10-23 MED ORDER — POVIDONE-IODINE 10 % OINT PACKET
TOPICAL_OINTMENT | CUTANEOUS | Status: DC | PRN
  Administered 2016-10-23: 1 via TOPICAL

## 2016-10-23 MED ORDER — OXYCODONE-ACETAMINOPHEN 5-325 MG PO TABS
1.0000 | ORAL_TABLET | ORAL | Status: DC | PRN
  Administered 2016-10-23: 1 via ORAL
  Filled 2016-10-23: qty 1

## 2016-10-23 MED ORDER — PROPOFOL 10 MG/ML IV BOLUS
INTRAVENOUS | Status: DC | PRN
  Administered 2016-10-23: 50 mg via INTRAVENOUS
  Administered 2016-10-23: 150 mg via INTRAVENOUS

## 2016-10-23 MED ORDER — SIMETHICONE 80 MG PO CHEW: 40.0000 mg | CHEWABLE_TABLET | Freq: Four times a day (QID) | ORAL | Status: DC | PRN

## 2016-10-23 MED ORDER — NEOSTIGMINE METHYLSULFATE 10 MG/10ML IV SOLN
INTRAVENOUS | Status: DC | PRN
Start: 2016-10-23 — End: 2016-10-23
  Administered 2016-10-23: 3 mg via INTRAVENOUS

## 2016-10-23 MED ORDER — ALBUTEROL SULFATE (2.5 MG/3ML) 0.083% IN NEBU: 2.5000 mg | INHALATION_SOLUTION | RESPIRATORY_TRACT | Status: DC | PRN

## 2016-10-23 MED ORDER — LACTATED RINGERS IV SOLN
INTRAVENOUS | Status: DC
  Administered 2016-10-23: 09:00:00 via INTRAVENOUS

## 2016-10-23 MED ORDER — PROPOFOL 10 MG/ML IV BOLUS
INTRAVENOUS | Status: AC
  Filled 2016-10-23: qty 20

## 2016-10-23 MED ORDER — LORAZEPAM 2 MG/ML IJ SOLN
1.0000 mg | INTRAMUSCULAR | Status: DC | PRN
Start: 2016-10-23 — End: 2016-10-23

## 2016-10-23 MED ORDER — HYDROMORPHONE HCL 1 MG/ML IJ SOLN: 1.0000 mg | INTRAMUSCULAR | Status: DC | PRN

## 2016-10-23 MED ORDER — FENTANYL CITRATE (PF) 250 MCG/5ML IJ SOLN
INTRAMUSCULAR | Status: AC
  Filled 2016-10-23: qty 5

## 2016-10-23 MED ORDER — NEOSTIGMINE METHYLSULFATE 10 MG/10ML IV SOLN
INTRAVENOUS | Status: AC
  Filled 2016-10-23: qty 1

## 2016-10-23 MED ORDER — MIDAZOLAM HCL 2 MG/2ML IJ SOLN
INTRAMUSCULAR | Status: AC
  Filled 2016-10-23: qty 2

## 2016-10-23 MED ORDER — FENTANYL CITRATE (PF) 100 MCG/2ML IJ SOLN
INTRAMUSCULAR | Status: DC | PRN
  Administered 2016-10-23 (×4): 50 ug via INTRAVENOUS

## 2016-10-23 MED ORDER — ACETAMINOPHEN 325 MG PO TABS: 650.0000 mg | ORAL_TABLET | Freq: Four times a day (QID) | ORAL | Status: DC | PRN

## 2016-10-23 MED ORDER — ONDANSETRON 4 MG PO TBDP: 4.0000 mg | ORAL_TABLET | Freq: Four times a day (QID) | ORAL | Status: DC | PRN

## 2016-10-23 MED ORDER — ACETAMINOPHEN 650 MG RE SUPP: 650.0000 mg | Freq: Four times a day (QID) | RECTAL | Status: DC | PRN

## 2016-10-23 MED ORDER — GLYCOPYRROLATE 0.2 MG/ML IJ SOLN
INTRAMUSCULAR | Status: AC
Start: 2016-10-23 — End: 2016-10-23
  Filled 2016-10-23: qty 1

## 2016-10-23 MED ORDER — ONDANSETRON HCL 4 MG/2ML IJ SOLN
4.0000 mg | Freq: Once | INTRAMUSCULAR | Status: AC
  Administered 2016-10-23: 4 mg via INTRAVENOUS

## 2016-10-23 SURGICAL SUPPLY — 46 items
APL SRG 38 LTWT LNG FL B (MISCELLANEOUS) ×1
APPLICATOR ARISTA FLEXITIP XL (MISCELLANEOUS) ×2 IMPLANT
BAG HAMPER (MISCELLANEOUS) ×3 IMPLANT
BAG SPEC RTRVL LRG 6X4 10 (ENDOMECHANICALS) ×1
CHLORAPREP W/TINT 26ML (MISCELLANEOUS) ×3 IMPLANT
CLOTH BEACON ORANGE TIMEOUT ST (SAFETY) ×3 IMPLANT
COVER LIGHT HANDLE STERIS (MISCELLANEOUS) ×6 IMPLANT
CUTTER FLEX LINEAR 45M (STAPLE) ×3 IMPLANT
DECANTER SPIKE VIAL GLASS SM (MISCELLANEOUS) ×3 IMPLANT
ELECT REM PT RETURN 9FT ADLT (ELECTROSURGICAL) ×3
ELECTRODE REM PT RTRN 9FT ADLT (ELECTROSURGICAL) ×1 IMPLANT
EVACUATOR SMOKE 8.L (FILTER) ×3 IMPLANT
FORMALIN 10 PREFIL 120ML (MISCELLANEOUS) ×3 IMPLANT
GLOVE BIOGEL PI IND STRL 7.0 (GLOVE) ×2 IMPLANT
GLOVE BIOGEL PI INDICATOR 7.0 (GLOVE) ×4
GLOVE SURG SS PI 7.5 STRL IVOR (GLOVE) ×3 IMPLANT
GOWN STRL REUS W/ TWL XL LVL3 (GOWN DISPOSABLE) ×1 IMPLANT
GOWN STRL REUS W/TWL LRG LVL3 (GOWN DISPOSABLE) ×3 IMPLANT
GOWN STRL REUS W/TWL XL LVL3 (GOWN DISPOSABLE) ×3
HEMOSTAT ARISTA ABSORB 1G (MISCELLANEOUS) ×2 IMPLANT
INST SET LAPROSCOPIC AP (KITS) ×3 IMPLANT
KIT ROOM TURNOVER APOR (KITS) ×3 IMPLANT
MANIFOLD NEPTUNE II (INSTRUMENTS) ×3 IMPLANT
NDL INSUFFLATION 14GA 120MM (NEEDLE) ×1 IMPLANT
NEEDLE INSUFFLATION 14GA 120MM (NEEDLE) ×3 IMPLANT
NS IRRIG 1000ML POUR BTL (IV SOLUTION) ×3 IMPLANT
PACK LAP CHOLE LZT030E (CUSTOM PROCEDURE TRAY) ×3 IMPLANT
PAD ARMBOARD 7.5X6 YLW CONV (MISCELLANEOUS) ×3 IMPLANT
PENCIL HANDSWITCHING (ELECTRODE) ×5 IMPLANT
POUCH SPECIMEN RETRIEVAL 10MM (ENDOMECHANICALS) ×3 IMPLANT
RELOAD 45 VASCULAR/THIN (ENDOMECHANICALS) ×3 IMPLANT
RELOAD STAPLE 45 2.5 WHT GRN (ENDOMECHANICALS) IMPLANT
SET BASIN LINEN APH (SET/KITS/TRAYS/PACK) ×3 IMPLANT
SHEARS HARMONIC ACE PLUS 36CM (ENDOMECHANICALS) ×3 IMPLANT
SPONGE GAUZE 2X2 8PLY STER LF (GAUZE/BANDAGES/DRESSINGS) ×3
SPONGE GAUZE 2X2 8PLY STRL LF (GAUZE/BANDAGES/DRESSINGS) ×6 IMPLANT
STAPLER VISISTAT (STAPLE) ×3 IMPLANT
SUT VICRYL 0 UR6 27IN ABS (SUTURE) ×3 IMPLANT
TAPE CLOTH SURG 4X10 WHT LF (GAUZE/BANDAGES/DRESSINGS) ×2 IMPLANT
TRAY FOLEY CATH SILVER 16FR (SET/KITS/TRAYS/PACK) ×3 IMPLANT
TROCAR ENDO BLADELESS 11MM (ENDOMECHANICALS) ×3 IMPLANT
TROCAR ENDO BLADELESS 12MM (ENDOMECHANICALS) ×3 IMPLANT
TROCAR XCEL NON-BLD 5MMX100MML (ENDOMECHANICALS) ×3 IMPLANT
TUBING INSUFFLATION (TUBING) ×3 IMPLANT
WARMER LAPAROSCOPE (MISCELLANEOUS) ×3 IMPLANT
YANKAUER SUCT 12FT TUBE ARGYLE (SUCTIONS) ×3 IMPLANT

## 2016-10-23 NOTE — Progress Notes (Addendum)
IV removed, site WNL.  Pt given d/c instructions and new prescriptions.  Discussed all home medications (when, how, and why to take), patient verbalizes understanding. Discussed home care with patient, teachback completed. Called Dr. Lovell SheehanJenkins office- no answer- left message to call patient on cell to set up f/u appointment, pt states they will make and keep appointment. Pt is stable at this time, eating lunch.

## 2016-10-23 NOTE — Op Note (Signed)
Patient:  Melanie Mendoza  DOB:  12/20/1970  MRN:  [id number]   Preop Diagnosis:  Acute appendicitis  Postop Diagnosis:  Same  Procedure:  Laparoscopic appendectomy  Surgeon:  Franky MachoMark Krystall Kruckenberg, M.D.  Anes:  Gen. endotracheal  Indications:  Patient is a 46 year old white female who takes Xeralto for history of DVT/PE who presented with 48 hour history of worsening right lower quadrant abdominal pain. CT scan the abdomen revealed acute appendicitis. The patient now comes to the operating room for laparoscopic appendectomy after being off Xeralto for 48 hrs. The risks and benefits of the procedure including bleeding, infection, and the possibility of an open procedure were fully explained to the patient, who gave informed consent.  Procedure note:  The patient was placed the supine position. After induction of general endotracheal anesthesia, the abdomen was prepped and draped using the usual sterile technique with DuraPrep. Surgical site confirmation was performed.  A supraumbilical incision was made down to the fascia. A Veress needle was introduced into the abdominal cavity and confirmation of placement was done using the saline drop test. The abdomen was then insufflated to 16 mmHg pressure. An 11 mm trocar was introduced into the abdominal cavity under direct visualization without difficulty. The patient was placed in deeper Trendelenburg position and an additional 12 mm trocar was placed the suprapubic region and a 5 mm trocar was placed left lower quadrant region. The appendix was visualized and noted to be grossly inflamed. There was no evidence of perforation. The mesoappendix was divided using the harmonic scalpel. A vascular Endo GIA was placed across the base the appendix and fired. The appendix was then removed using an Endo Catch bag without difficulty. The staple line was inspected and noted to be within normal limits.  Arista was placed along the mesoappendix. All fluid and air were then  evacuated from the abdominal cavity prior to removal of the trochars.  All wounds were irrigated with normal saline. All wounds were injected with 0.5% Sensorcaine. The supraumbilical fascia was reapproximated using an 0 Vicryl interrupted suture. All skin incisions were closed using staples. Betadine ointment and dry sterile dressings were applied.  All tape and needle counts were correct at the end of the procedure. The patient was extubated in the operating room and transferred to PACU in stable condition.  Complications:  None  EBL:  Minimal  Specimen:  Appendix

## 2016-10-23 NOTE — Anesthesia Postprocedure Evaluation (Signed)
Anesthesia Post Note  Patient: Melanie Mendoza  Procedure(s) Performed: Procedure(s) (LRB): APPENDECTOMY LAPAROSCOPIC (N/A)  Patient location during evaluation: PACU Anesthesia Type: General Level of consciousness: awake and alert Pain management: satisfactory to patient Vital Signs Assessment: post-procedure vital signs reviewed and stable Respiratory status: spontaneous breathing Cardiovascular status: stable Anesthetic complications: no     Last Vitals:  Vitals:   10/23/16 1045 10/23/16 1105  BP: 125/83 120/76  Pulse: 91 91  Resp: 10 16  Temp:  36.8 C    Last Pain:  Vitals:   10/23/16 1105  TempSrc: Oral  PainSc:                  Minerva AreolaYATES,Bailey Kolbe

## 2016-10-23 NOTE — Progress Notes (Signed)
Patient did have one episode of vomiting.  After episode she ate fruit cup, pudding and a roll- tolerated well. States she feels much better and wants to be discharged.  Taken to main entrance via wheelchair with staff.

## 2016-10-23 NOTE — Discharge Instructions (Signed)
Laparoscopic Appendectomy, Adult, Care After °Refer to this sheet in the next few weeks. These instructions provide you with information about caring for yourself after your procedure. Your health care provider may also give you more specific instructions. Your treatment has been planned according to current medical practices, but problems sometimes occur. Call your health care provider if you have any problems or questions after your procedure. °What can I expect after the procedure? °After the procedure, it is common to have: °· A decrease in your energy level. °· Mild pain in the area where the surgical cuts (incisions) were made. °· Constipation. This can be caused by pain medicine and a decrease in your activity. °Follow these instructions at home: °Medicines  °· Take over-the-counter and prescription medicines only as told by your health care provider. °· Do not drive for 24 hours if you received a sedative. °· Do not drive or operate heavy machinery while taking prescription pain medicine. °· If you were prescribed an antibiotic medicine, take it as told by your health care provider. Do not stop taking the antibiotic even if you start to feel better. °Activity  °· For 3 weeks or as long as told by your health care provider: °¨ Do not lift anything that is heavier than 10 pounds (4.5 kg). °¨ Do not play contact sports. °· Gradually return to your normal activities. Ask your health care provider what activities are safe for you. °Bathing  °· Keep your incisions clean and dry. Clean them as often as told by your health care provider: °¨ Gently wash the incisions with soap and water. °¨ Rinse the incisions with water to remove all soap. °¨ Pat the incisions dry with a clean towel. Do not rub the incisions. °· You may take showers after 48 hours. °· Do not take baths, swim, or use hot tubs for 2 weeks or as told by your health care provider. °Incision care  °· Follow instructions from your healthcare provider  about how to take care of your incisions. Make sure you: °¨ Wash your hands with soap and water before you change your bandage (dressing). If soap and water are not available, use hand sanitizer. °¨ Change your dressing as told by your health care provider. °¨ Leave stitches (sutures), skin glue, or adhesive strips in place. These skin closures may need to stay in place for 2 weeks or longer. If adhesive strip edges start to loosen and curl up, you may trim the loose edges. Do not remove adhesive strips completely unless your health care provider tells you to do that. °· Check your incision areas every day for signs of infection. Check for: °¨ More redness, swelling, or pain. °¨ More fluid or blood. °¨ Warmth. °¨ Pus or a bad smell. °Other Instructions  °· If you were sent home with a drain, follow instructions from your health care provider about how to care for the drain and how to empty it. °· Take deep breaths. This helps to prevent your lungs from becoming inflamed. °· To relieve and prevent constipation: °¨ Drink plenty of fluids. °¨ Eat plenty of fruits and vegetables. °· Keep all follow-up visits as told by your health care provider. This is important. °Contact a health care provider if: °· You have more redness, swelling, or pain around an incision. °· You have more fluid or blood coming from an incision. °· Your incision feels warm to the touch. °· You have pus or a bad smell coming from an incision or   dressing. °· Your incision edges break open after your sutures have been removed. °· You have increasing pain in your shoulders. °· You feel dizzy or you faint. °· You develop shortness of breath. °· You keep feeling nauseous or vomiting. °· You have diarrhea or you cannot control your bowel functions. °· You lose your appetite. °· You develop swelling or pain in your legs. °Get help right away if: °· You have a fever. °· You develop a rash. °· You have difficulty breathing. °· You have sharp pains in your  chest. °This information is not intended to replace advice given to you by your health care provider. Make sure you discuss any questions you have with your health care provider. °Document Released: 09/30/2005 Document Revised: 03/01/2016 Document Reviewed: 03/20/2015 °Elsevier Interactive Patient Education © 2017 [employer] ° °

## 2016-10-23 NOTE — Anesthesia Procedure Notes (Signed)
Procedure Name: Intubation Date/Time: 10/23/2016 9:16 AM Performed by: Franco NonesYATES, Tasmia Blumer S Pre-anesthesia Checklist: Patient identified, Patient being monitored, Timeout performed, Emergency Drugs available and Suction available Patient Re-evaluated:Patient Re-evaluated prior to inductionOxygen Delivery Method: Circle System Utilized Preoxygenation: Pre-oxygenation with 100% oxygen Intubation Type: IV induction Ventilation: Mask ventilation without difficulty Laryngoscope Size: Miller and 2 Grade View: Grade I Tube type: Oral Tube size: 7.0 mm Number of attempts: 1 Airway Equipment and Method: Stylet Placement Confirmation: ETT inserted through vocal cords under direct vision,  positive ETCO2 and breath sounds checked- equal and bilateral Secured at: 21 cm Tube secured with: Tape Dental Injury: Teeth and Oropharynx as per pre-operative assessment

## 2016-10-23 NOTE — Transfer of Care (Signed)
Immediate Anesthesia Transfer of Care Note  Patient: Melanie Mendoza  Procedure(s) Performed: Procedure(s): APPENDECTOMY LAPAROSCOPIC (N/A)  Patient Location: PACU  Anesthesia Type:General  Level of Consciousness: awake and patient cooperative  Airway & Oxygen Therapy: Patient Spontanous Breathing and non-rebreather face mask  Post-op Assessment: Report given to RN and Post -op Vital signs reviewed and stable  Post vital signs: Reviewed and stable  Last Vitals:  Vitals:   10/23/16 0850 10/23/16 0900  BP: 120/83   Pulse:    Resp: 16 10  Temp:      Last Pain:  Vitals:   10/23/16 0836  TempSrc: Oral  PainSc: 10-Worst pain ever      Patients Stated Pain Goal: 5 (10/23/16 0836)  Complications: No apparent anesthesia complications

## 2016-10-23 NOTE — Anesthesia Preprocedure Evaluation (Addendum)
Anesthesia Evaluation  Patient identified by MRN, date of birth, ID band Patient awake    Reviewed: Allergy & Precautions, NPO status , Patient's Chart, lab work & pertinent test results  Airway Mallampati: II  TM Distance: >3 FB Neck ROM: Full    Dental  (+) Poor Dentition, Chipped, Missing, Dental Advisory Given   Pulmonary asthma ( used inhaler this AM ) , PE   breath sounds clear to auscultation       Cardiovascular + DVT (xaralto Rx)   Rhythm:Regular Rate:Normal     Neuro/Psych    GI/Hepatic PUD,   Endo/Other  diabetes (pre diabetes)  Renal/GU      Musculoskeletal   Abdominal   Peds  Hematology   Anesthesia Other Findings Hx Polysubstance abuse and Narcotic abuse in remission.  Long term (current) use of anticoagulants , Xaralto last taken 2 days ago.  Reproductive/Obstetrics                           Anesthesia Physical Anesthesia Plan  ASA: III and emergent  Anesthesia Plan: General   Post-op Pain Management:    Induction: Intravenous, Rapid sequence and Cricoid pressure planned  Airway Management Planned: Oral ETT  Additional Equipment:   Intra-op Plan:   Post-operative Plan: Extubation in OR  Informed Consent: I have reviewed the patients History and Physical, chart, labs and discussed the procedure including the risks, benefits and alternatives for the proposed anesthesia with the patient or authorized representative who has indicated his/her understanding and acceptance.     Plan Discussed with:   Anesthesia Plan Comments:         Anesthesia Quick Evaluation

## 2016-10-25 ENCOUNTER — Encounter (HOSPITAL_COMMUNITY): Payer: Self-pay | Admitting: General Surgery

## 2016-10-25 NOTE — Discharge Summary (Signed)
Physician Discharge Summary  Patient ID: SERINE KEA MRN: [id number] DOB/AGE: June 03, 1971 46 y.o.  Admit date: 10/22/2016 Discharge date: 10/23/2016  Admission Diagnoses:Acute appendicitis, history of DVT/PE, chronically anticoagulated  Discharge Diagnoses: Same Active Problems:   Acute appendicitis   Discharged Condition: good  Hospital Course: Patient is a 46 year old white female who presented emergency room with a 2 day history of worsening right lower quadrant abdominal pain. CT scan of the abdomen revealed acute appendicitis. She had been taking Xeralto for history of DVT/PE, thus the surgery had to be delayed for 24 hours. She was admitted to the hospital for further evaluation treatment. She was started on IV Zosyn. She subsequently underwent laparoscopic cholecystectomy on 10/23/2016. She tolerated the procedure well. Her postoperative course was unremarkable. Her diet was advanced without difficulty. The patient was discharged home on 10/23/2016 in good and improving condition. She was instructed to restart her Xarelto that evening.  Treatments: surgery: Laparoscopic appendectomy on 10/23/2016  Discharge Exam: Blood pressure 121/73, pulse 91, temperature 98.1 F (36.7 C), temperature source Oral, resp. rate 16, height 5\' 7"  (1.702 m), weight 88.5 kg (195 lb), last menstrual period 09/12/2016, SpO2 90 %. General appearance: alert, cooperative and no distress Resp: clear to auscultation bilaterally Cardio: regular rate and rhythm, S1, S2 normal, no murmur, click, rub or gallop GI: Soft. Dressings dry and intact.  Disposition: 01-Home or Self Care  Discharge Instructions    Increase activity slowly    Complete by:  As directed    Remove dressing in 48 hours    Complete by:  As directed      Allergies as of 10/23/2016   No Known Allergies     Medication List    TAKE these medications   acetaminophen 500 MG tablet Commonly known as:  TYLENOL Take 1,000 mg by mouth  every 6 (six) hours as needed for moderate pain.   albuterol 108 (90 Base) MCG/ACT inhaler Commonly known as:  PROVENTIL HFA;VENTOLIN HFA Inhale 2 puffs into the lungs every 4 (four) hours as needed for wheezing or shortness of breath.   albuterol (2.5 MG/3ML) 0.083% nebulizer solution Commonly known as:  PROVENTIL Take 3 mLs (2.5 mg total) by nebulization every 6 (six) hours as needed for wheezing or shortness of breath.   atorvastatin 20 MG tablet Commonly known as:  LIPITOR Take 1 tablet (20 mg total) by mouth daily.   fluticasone 110 MCG/ACT inhaler Commonly known as:  FLOVENT HFA Inhale 2 puffs into the lungs daily.   lovastatin 20 MG tablet Commonly known as:  MEVACOR Take 1 tablet (20 mg total) by mouth at bedtime.   mometasone-formoterol 100-5 MCG/ACT Aero Commonly known as:  DULERA Inhale 2 puffs into the lungs 2 (two) times daily.   oxyCODONE-acetaminophen 7.5-325 MG tablet Commonly known as:  PERCOCET Take 1-2 tablets by mouth every 4 (four) hours as needed.   rivaroxaban 20 MG Tabs tablet Commonly known as:  XARELTO Take 1 tablet (20 mg total) by mouth daily with supper.   sulfaSALAzine 500 MG tablet Commonly known as:  AZULFIDINE Take 500 mg by mouth 2 (two) times daily.      Follow-up Information    Dalia Heading, MD. Schedule an appointment as soon as possible for a visit on 10/31/2016.   Specialty:  General Surgery Why:  Dr. Lovell Sheehan office should be calling you with appt time, we left a message. Contact information: [address] [place] [address] [phone]  SignedFranky Macho: Kimmy Parish A 10/25/2016, 9:57 AM

## 2016-11-04 ENCOUNTER — Other Ambulatory Visit: Payer: Self-pay | Admitting: Physician Assistant

## 2016-11-13 ENCOUNTER — Encounter (HOSPITAL_COMMUNITY): Payer: Self-pay | Admitting: Oncology

## 2016-11-14 ENCOUNTER — Ambulatory Visit: Payer: Self-pay | Admitting: Physician Assistant

## 2016-11-19 ENCOUNTER — Encounter: Payer: Self-pay | Admitting: Physician Assistant

## 2016-12-04 ENCOUNTER — Other Ambulatory Visit: Payer: Self-pay

## 2016-12-04 DIAGNOSIS — E785 Hyperlipidemia, unspecified: Secondary | ICD-10-CM

## 2016-12-11 ENCOUNTER — Ambulatory Visit: Payer: Self-pay | Admitting: Physician Assistant

## 2016-12-26 ENCOUNTER — Other Ambulatory Visit: Payer: Self-pay | Admitting: Physician Assistant

## 2016-12-30 ENCOUNTER — Ambulatory Visit: Payer: Self-pay | Admitting: *Deleted

## 2017-01-29 ENCOUNTER — Ambulatory Visit: Payer: Self-pay | Admitting: Physician Assistant

## 2017-01-30 ENCOUNTER — Encounter: Payer: Self-pay | Admitting: Physician Assistant

## 2017-03-28 ENCOUNTER — Other Ambulatory Visit: Payer: Self-pay | Admitting: Physician Assistant

## 2017-04-03 ENCOUNTER — Other Ambulatory Visit: Payer: Self-pay | Admitting: Physician Assistant

## 2017-05-22 ENCOUNTER — Encounter (HOSPITAL_COMMUNITY): Payer: Medicaid Other

## 2017-06-13 ENCOUNTER — Ambulatory Visit (HOSPITAL_COMMUNITY): Payer: Medicaid Other

## 2017-06-24 ENCOUNTER — Encounter (HOSPITAL_COMMUNITY): Payer: Medicaid Other | Attending: Oncology | Admitting: Oncology

## 2017-06-24 ENCOUNTER — Encounter (HOSPITAL_COMMUNITY): Payer: Self-pay

## 2017-06-24 VITALS — BP 137/90 | HR 70 | Resp 16 | Ht 67.0 in | Wt 181.0 lb

## 2017-06-24 DIAGNOSIS — K5190 Ulcerative colitis, unspecified, without complications: Secondary | ICD-10-CM

## 2017-06-24 DIAGNOSIS — Z86718 Personal history of other venous thrombosis and embolism: Secondary | ICD-10-CM | POA: Diagnosis not present

## 2017-06-24 DIAGNOSIS — Z1239 Encounter for other screening for malignant neoplasm of breast: Secondary | ICD-10-CM

## 2017-06-24 DIAGNOSIS — Z86711 Personal history of pulmonary embolism: Secondary | ICD-10-CM

## 2017-06-24 DIAGNOSIS — Z7901 Long term (current) use of anticoagulants: Secondary | ICD-10-CM | POA: Diagnosis not present

## 2017-06-24 MED ORDER — ALBUTEROL SULFATE HFA 108 (90 BASE) MCG/ACT IN AERS: 2.0000 | INHALATION_SPRAY | RESPIRATORY_TRACT | 3 refills | Status: DC | PRN

## 2017-06-24 NOTE — Progress Notes (Signed)
[facility] Hematology/Oncology Consultation   Name: Melanie Mendoza      MRN: [id number]    Date: 06/24/2017 Time:8:40 PM   REFERRING PHYSICIAN:  Jacquelin HawkingShannon McElroy, PA-C (Primary Care Provider)  REASON FOR CONSULT:  Evaluate for ongoing need for anticoagulation   DIAGNOSIS:  Bilateral pulmonary emboli (R>L) with moderate clot burden and right heart strain (submassive PE) on 09/10/2013 in the setting of DVT in left lower extremity involving the left popliteal vein on 09/08/2013; on Coumadin anticoagulation since.  HISTORY OF PRESENT ILLNESS:   Melanie Mendoza is a 46 y.o. 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  On 08/20/2013, the patient presented to the ED with SOB and chest pain.  She was found to be encephalopathic and there was concern for drug overdose at that time.  She was admitted and the patient left AMA on 08/21/2013.   She re-presented on 09/08/2013 with shortness of breath and left leg pain.  She was found to have a left lower extremity DVT on 09/08/2013.  She was started on anticoagulation and admitted.  On 09/10/2013, CT angio per PE protocol was performed and found to be positive for a submassive PE.  Hypercoag panel was performed on 09/13/2013 and was negative EXCEPT for low protein C and protein S activity.  She was incarcerated shortly following her PE diagnosis.  She was maintained on Coumadin with weekly INRs while serving a 2 year sentence in [place], [place] for larceny.  She notes that her INRs were well maintained while incarcerated.  She also have ulcerative colitis which is currently managed on sulfasalazine.  She was scheduled to see Dr. Jena Gaussourk, but  missed that appointment.  From a drug standpoint, she has been clean x 2 years.  She is not on any controlled substances at this time.  [name] Controlled Substance Reporting System is reviewed and confirms no filled medications in the state of [place] .  She reports that this instance is her only VTE.  She reports that her mother had a DVT in her upper extremity.  She is deceased from lung cancer.  She reports that the DVT was years prior to her lung cancer diagnosis.  INTERVAL HISTORY: Patient presents today for continued follow up. Since her last visit she had undergone a laporascopic appendectomy for acute appendicitis in January 2018. She did not have any bleeding problems during her surgery. She resumed xarelto right after surgery. She states that for the past few months she's had a flare of her ulcerative colitis and has noted blood in her bowel movements. She has not seen a GI physician in a long time and has not been on any medication for her ulcerative colitis in months. She also has not had a mammogram in over a year. She denies any chest pain, shortness of breath, abdominal pain, focal weakness.  Review of Systems  Constitutional: Negative.  Negative for chills, fever and weight loss.  HENT: Negative.  Negative for nosebleeds.   Eyes: Negative.   Respiratory: Negative.  Negative for cough and shortness of breath.   Cardiovascular: Negative.  Negative for chest pain and leg swelling.  Gastrointestinal: Positive for blood in stool. Negative for abdominal  pain, melena, nausea and vomiting.  Genitourinary: Negative for hematuria.  Musculoskeletal: Negative.   Skin: Negative.  Negative for rash.  Neurological: Negative.  Negative for weakness and headaches.  Endo/Heme/Allergies: Negative.   Psychiatric/Behavioral: Negative.   14 point review of systems was performed and is negative except as detailed under history of present illness and above    PAST MEDICAL HISTORY:     Past Medical History:  Diagnosis Date  . Acute pulmonary embolism (HCC) 09/13/2013  . Asthma   . Bilateral ovarian cysts   . PE (pulmonary embolism) 09/2013  . Polysubstance abuse   . Ulcerative colitis (HCC)     ALLERGIES: No Known Allergies    MEDICATIONS: I have reviewed the patient's current medications.    Current Outpatient Prescriptions on File Prior to Visit  Medication Sig Dispense Refill  . acetaminophen (TYLENOL) 500 MG tablet Take 1,000 mg by mouth every 6 (six) hours as needed for moderate pain.    Marland Kitchen albuterol (PROVENTIL) (2.5 MG/3ML) 0.083% nebulizer solution INHALE 1 VIAL VIA NEBULIZER EVERY 6 HOURS AS NEEDED FOR WHEEZING OR SHORTNESS OF BREATH 90 mL 2  . atorvastatin (LIPITOR) 20 MG tablet Take 1 tablet (20 mg total) by mouth daily. 90 tablet 1  . fluticasone (FLOVENT HFA) 110 MCG/ACT inhaler Inhale 2 puffs into the lungs daily. 1 Inhaler 12  . lovastatin (MEVACOR) 20 MG tablet Take 1 tablet (20 mg total) by mouth at bedtime. 30 tablet 4  . mometasone-formoterol (DULERA) 100-5 MCG/ACT AERO Inhale 2 puffs into the lungs 2 (two) times daily. 3 Inhaler 2  . oxyCODONE-acetaminophen (PERCOCET) 7.5-325 MG tablet Take 1-2 tablets by mouth every 4 (four) hours as needed. 50 tablet 0  . rivaroxaban (XARELTO) 20 MG TABS tablet Take 1 tablet (20 mg total) by mouth daily with supper. 30 tablet 11  . sulfaSALAzine (AZULFIDINE) 500 MG tablet Take 500 mg by mouth 2 (two) times daily.      No current facility-administered medications on file prior to visit.      PAST SURGICAL HISTORY Past Surgical History:  Procedure Laterality Date  . LAPAROSCOPIC APPENDECTOMY N/A 10/23/2016   Procedure: APPENDECTOMY LAPAROSCOPIC;  Surgeon: Franky Macho, MD;  Location: [facility];  Service: General;  Laterality: N/A;    FAMILY HISTORY: Family History  Problem Relation Age of Onset  . Lung cancer Mother   . Cancer Mother     SOCIAL HISTORY: She admits to a history of drug abuse including  benzos and opiates.  She admits to a history of EtOHism.  She is clean x 2 years.  She is engaged.  Social History   Social History  . Marital status: Divorced    Spouse name: N/A  . Number of children: N/A  . Years of education: N/A   Social History Main Topics  . Smoking status: Never Smoker  . Smokeless tobacco: Never Used  . Alcohol use No  . Drug use: No     Comment: none since 2014  . Sexual activity: Yes    Birth control/ protection: None   Other Topics Concern  . None   Social History Narrative  . None    PERFORMANCE STATUS: The patient's performance status is 0 - Asymptomatic  PHYSICAL EXAM: Most Recent Vital Signs: Blood pressure 137/90, pulse 70, resp. rate 16, height  (1.702 m), weight 181 lb (82.1 kg), SpO2 98 %. General appearance: alert, cooperative, appears stated age, no distress, moderately obese and accompanied by her cousin Aggie Cosier Head: Normocephalic,  without obvious abnormality, atraumatic Throat: normal findings: lips normal without lesions and oropharynx pink & moist without lesions or evidence of thrush Neck: no adenopathy, supple, symmetrical, trachea midline and thyroid not enlarged, symmetric, no tenderness/mass/nodules Lungs: clear to auscultation bilaterally and normal percussion bilaterally Heart: regular rate and rhythm, S1, S2 normal, no murmur, click, rub or gallop Abdomen: soft, non-tender; bowel sounds normal; no masses,  no organomegaly Extremities: extremities normal, atraumatic, no cyanosis or edema Skin: Skin color, texture, turgor normal. No rashes or lesions Lymph nodes: Cervical, supraclavicular, and axillary nodes normal. Neurologic: Alert and oriented X 3, normal strength and tone. Normal symmetric reflexes. Normal coordination and gait  LABORATORY DATA:  CBC    Component Value Date/Time   WBC 13.1 (H) 10/22/2016 1154   RBC 4.61 10/22/2016 1154   HGB 13.3 10/22/2016 1154   HCT 40.0 10/22/2016 1154   PLT 326  10/22/2016 1154   MCV 86.8 10/22/2016 1154   MCH 28.9 10/22/2016 1154   MCHC 33.3 10/22/2016 1154   RDW 12.7 10/22/2016 1154   LYMPHSABS 3.0 07/03/2016 1930   MONOABS 0.9 07/03/2016 1930   EOSABS 0.7 07/03/2016 1930   BASOSABS 0.1 07/03/2016 1930     Chemistry      Component Value Date/Time   NA 135 10/22/2016 1154   K 3.8 10/22/2016 1154   CL 99 (L) 10/22/2016 1154   CO2 28 10/22/2016 1154   BUN 13 10/22/2016 1154   CREATININE 0.60 10/22/2016 1154   CREATININE 0.78 04/20/2016 0823      Component Value Date/Time   CALCIUM 8.9 10/22/2016 1154   ALKPHOS 65 10/22/2016 1154   AST 22 10/22/2016 1154   ALT 21 10/22/2016 1154   BILITOT 0.6 10/22/2016 1154     Lab Results  Component Value Date   INR 2.8 07/24/2016   INR 1.95 07/04/2016   INR 2.0 07/03/2016         RADIOGRAPHY: No results found.    CLINICAL DATA:  Wheezing and shortness of breath tonight, similar symptoms for week, worsening now. History of pulmonary embolism, polysubstance abuse, ulcerative colitis.  EXAM: CT ANGIOGRAPHY CHEST WITH CONTRAST  TECHNIQUE: Multidetector CT imaging of the chest was performed using the standard protocol during bolus administration of intravenous contrast. Multiplanar CT image reconstructions and MIPs were obtained to evaluate the vascular anatomy.  CONTRAST:  100 cc Isovue 370  COMPARISON:  Chest radiograph July 03, 2016 at 1929 hours and CT chest September 10, 2013  FINDINGS: CARDIOVASCULAR: Moderate respiratory motion degraded examination. Streak artifact limits assessment for upper lobe pulmonary emboli. Adequate contrast opacification of the pulmonary artery's. Main pulmonary artery is not enlarged. No pulmonary arterial filling defects to the level of the subsegmental branches. Heart size is normal, no right heart strain. No pericardial effusions. Thoracic aorta is normal course and caliber, unremarkable.  MEDIASTINUM/NODES: No lymphadenopathy by  CT size criteria. Sub cm mediastinal lymph nodes are likely reactive, similar to prior CT.  LUNGS/PLEURA: Tracheobronchial tree is patent, no pneumothorax. No pleural effusions, focal consolidations, pulmonary nodules or masses.  UPPER ABDOMEN: Included view of the abdomen is unremarkable.  MUSCULOSKELETAL: Presume motion artifact through the sternum. Visualized soft tissues and included osseous structures appear nonacute. Loose body RIGHT shoulder.  Review of the MIP images confirms the above findings.  IMPRESSION: No acute pulmonary embolism or acute cardiopulmonary process on this moderately respiratory motion degraded examination.  Person motion artifact to the sternum, recommend correlation point tenderness.   Electronically Signed   By:  Awilda Metro M.D.   On: 07/03/2016 21:48  CLINICAL DATA:  Chest pain and shortness of breath. History of a DVT.  EXAM: CT ANGIOGRAPHY CHEST WITH CONTRAST  TECHNIQUE: Multidetector CT imaging of the chest was performed using the standard protocol during bolus administration of intravenous contrast. Multiplanar CT image reconstructions including MIPs were obtained to evaluate the vascular anatomy.  CONTRAST:  OMNIPAQUE IOHEXOL 350 MG/ML SOLN  COMPARISON:  Chest radiograph, 09/08/2013  FINDINGS: There are multiple segmental pulmonary emboli. On the right, there are segmental pulmonary emboli to the anterior and apical upper lobe segments. There are smaller segmental middle lobe emboli and there are segmental emboli to the right lower lobe, most prominent to the posterior-basilar segment. Less pulmonary emboli are noted on the left. There are segmental lower lobe branch emboli. No other convincing emboli. Overall embolus burden is moderate.  The right ventricular to left ventricular ratio is 0.973 which can be indicative of right heart strain.  The heart is normal in size. The great vessels are normal  in caliber. There is shotty mild mediastinal adenopathy. A 13 mm short axis subcarinal node is noted. There is an 11 mm short axis right peritracheal node. These are likely reactive. No mediastinal masses. No hilar masses or enlarged lymph nodes. There arm prominent hilar lymph nodes, however.  There are no areas of lung consolidation to suggest infarction. Subsegmental atelectasis is noted most evident in the left lower lobe. Reticular opacities are noted elsewhere, most prominent in the anteromedial right upper lobe and right middle lobe, also likely subsegmental atelectasis.  No pleural effusion.  No pneumothorax.  Limited evaluation of the upper abdomen is unremarkable.  Minimal degenerative changes are noted of the thoracic spine.  Review of the MIP images confirms the above findings.  IMPRESSION: 1. There are bilateral pulmonary emboli affecting segmental and smaller branches, more notable on the right. Overall clot burden is moderate. 2. RV/LV ratio is mildly increased, which can be indicative of right heart strain and sub massive pulmonary embolus. Clinical correlation is advised. 3. Lungs show areas of subsegmental atelectasis, but no evidence of infarction. No pleural effusion.   Electronically Signed   By: Amie Portland M.D.   On: 09/10/2013 16:38    PATHOLOGY:  n/a  ASSESSMENT/PLAN:   Acute pulmonary embolism (HCC) LLE DVT   Unprovoked pulmonary embolism in the setting of a left lower extremity DVT in November 2014.  Anticoagulated with Vitamin K antagonist since 2014 without recurrence of VTE. Then switched to Xarelto in October 2017. Patient has been on xarelto since then for lifelong anticoagulation.  She is provided recent information regarding the growing data surrounding unprovoked VTE: Unprovoked proximal DVT and symptomatic PE-The decision regarding the use of indefinite anticoagulation in patients with a first episode of unprovoked  proximal deep vein thrombosis (DVT), unprovoked symptomatic pulmonary embolism (PE), or patients with active cancer is dependent upon patient-specific bleeding and thrombotic risks as well as the patient's values and preferences. For those with a low to moderate bleeding risk, we suggest indefinite treatment rather than treatment for three to six months. For patients with a high bleeding risk, the benefits of indefinite anticoagulation are likely outweighed by the high risk of bleeding such that indefinite anticoagulation is not generally performed.  The rationale for indefinite anticoagulation in patients with an unprovoked proximal DVT or symptomatic PE is based upon the high estimated lifetime risk of recurrent VTE, which can be dramatically reduced by extending anticoagulation beyond the conventional  three to six months. Importantly, most studies report a risk reduction in the rate of VTE recurrence at the expense of an increased rate of bleeding and without mortality benefit.  The estimated risk of recurrence following cessation of anticoagulation in patients with a first unprovoked episode of VTE is 10 percent at one year and 30 percent at five years (approximately 5 percent per year after the first year). Prior duration of therapy does not affect the risk of recurrence once anticoagulant therapy is discontinued. Full anticoagulation is associated with an over 90 percent reduction in the rate of recurrence compared with low-intensity anticoagulant regimens and aspirin which are less effective (approximately 60 and 30 percent rate reduction in recurrence respectively). This decrease in recurrence outweighs the rate of bleeding with full anticoagulation in most patients who are estimated to have a low (0.8 percent per year) or intermediate (1.6 percent per year) bleeding risk.  -Continue xarelto. Patient has been tolerating it well. -Patient currently does not have a PCP. I have given her a list of PCPs in  the [place] area and advised her to get established with one ASAP.  -I have refilled her albuterol inhaler. -I have placed a stat referral to GI for evaluation and treatment of her ulcerative colitis. -I have ordered a bilateral screening mammogram since patient has not had one in over a year. -RTC in one year for follow up.     MEDICATIONS PRESCRIBED THIS ENCOUNTER: Meds ordered this encounter  Medications  . albuterol (PROVENTIL HFA;VENTOLIN HFA) 108 (90 Base) MCG/ACT inhaler    Sig: Inhale 2 puffs into the lungs every 4 (four) hours as needed for wheezing or shortness of breath.    Dispense:  3 Inhaler    Refill:  3    All questions were answered. The patient knows to call the clinic with any problems, questions or concerns. We can certainly see the patient much sooner if necessary..  This note is electronically signed [email or web address] Janyth Contes, MD  06/24/2017 8:40 PM

## 2017-06-25 ENCOUNTER — Encounter (INDEPENDENT_AMBULATORY_CARE_PROVIDER_SITE_OTHER): Payer: Self-pay | Admitting: Internal Medicine

## 2017-06-26 ENCOUNTER — Ambulatory Visit (INDEPENDENT_AMBULATORY_CARE_PROVIDER_SITE_OTHER): Payer: Medicaid Other | Admitting: Internal Medicine

## 2017-06-26 ENCOUNTER — Encounter (INDEPENDENT_AMBULATORY_CARE_PROVIDER_SITE_OTHER): Payer: Self-pay | Admitting: Internal Medicine

## 2017-07-02 ENCOUNTER — Ambulatory Visit (HOSPITAL_COMMUNITY): Payer: Medicaid Other

## 2017-07-10 ENCOUNTER — Encounter (INDEPENDENT_AMBULATORY_CARE_PROVIDER_SITE_OTHER): Payer: Self-pay | Admitting: Internal Medicine

## 2017-08-07 ENCOUNTER — Ambulatory Visit (INDEPENDENT_AMBULATORY_CARE_PROVIDER_SITE_OTHER): Payer: Medicaid Other | Admitting: Internal Medicine

## 2017-08-07 ENCOUNTER — Encounter (INDEPENDENT_AMBULATORY_CARE_PROVIDER_SITE_OTHER): Payer: Self-pay | Admitting: Internal Medicine

## 2017-10-07 IMAGING — DX DG CHEST 2V
2 series · 2 of 2 positions shown · non-contrast
Comparison: 04/11/2016

CLINICAL DATA: Worsening dyspnea over the past 30 minutes. No
relief from breathing treatments.

EXAM:
CHEST  2 VIEW

[chest pa]
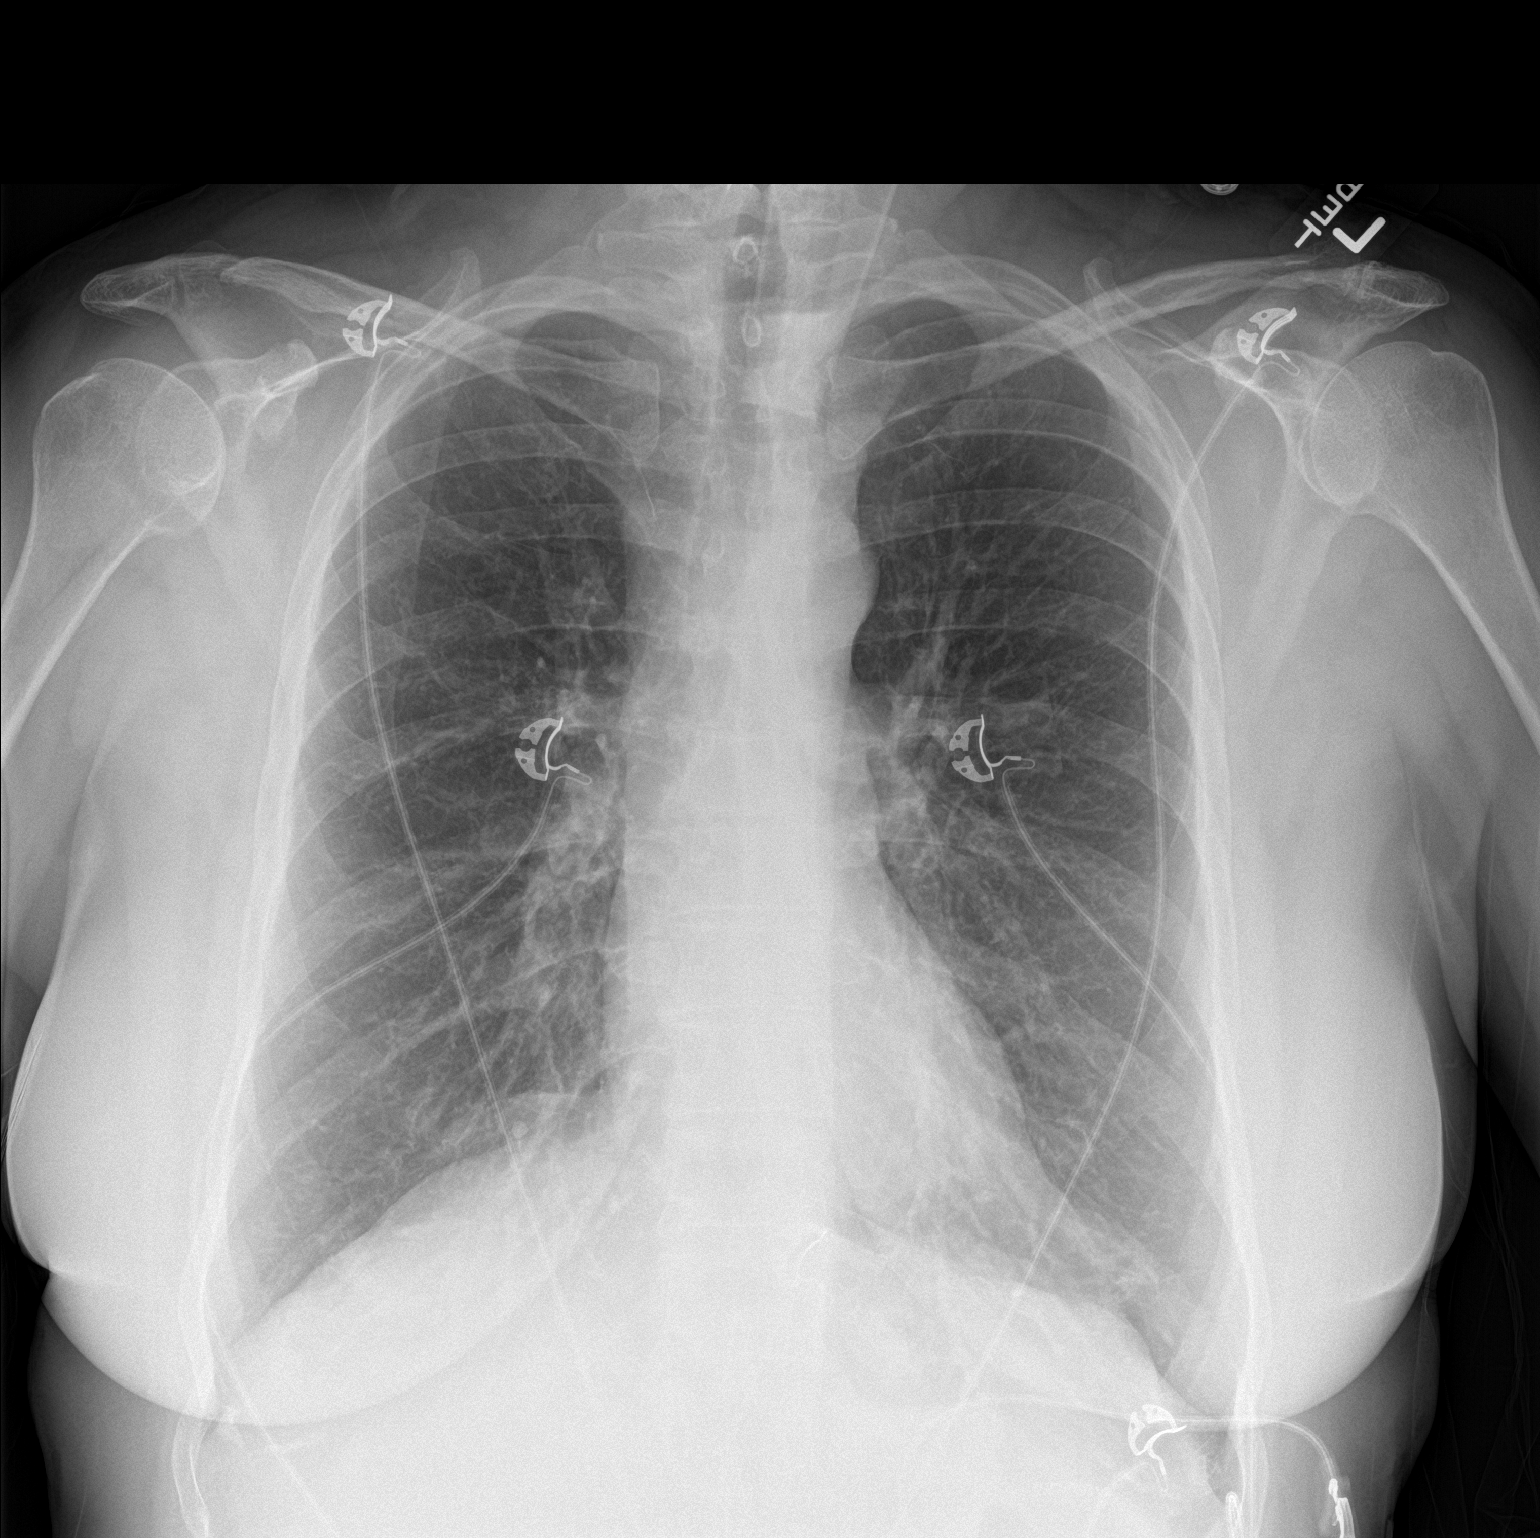

[chest lat]
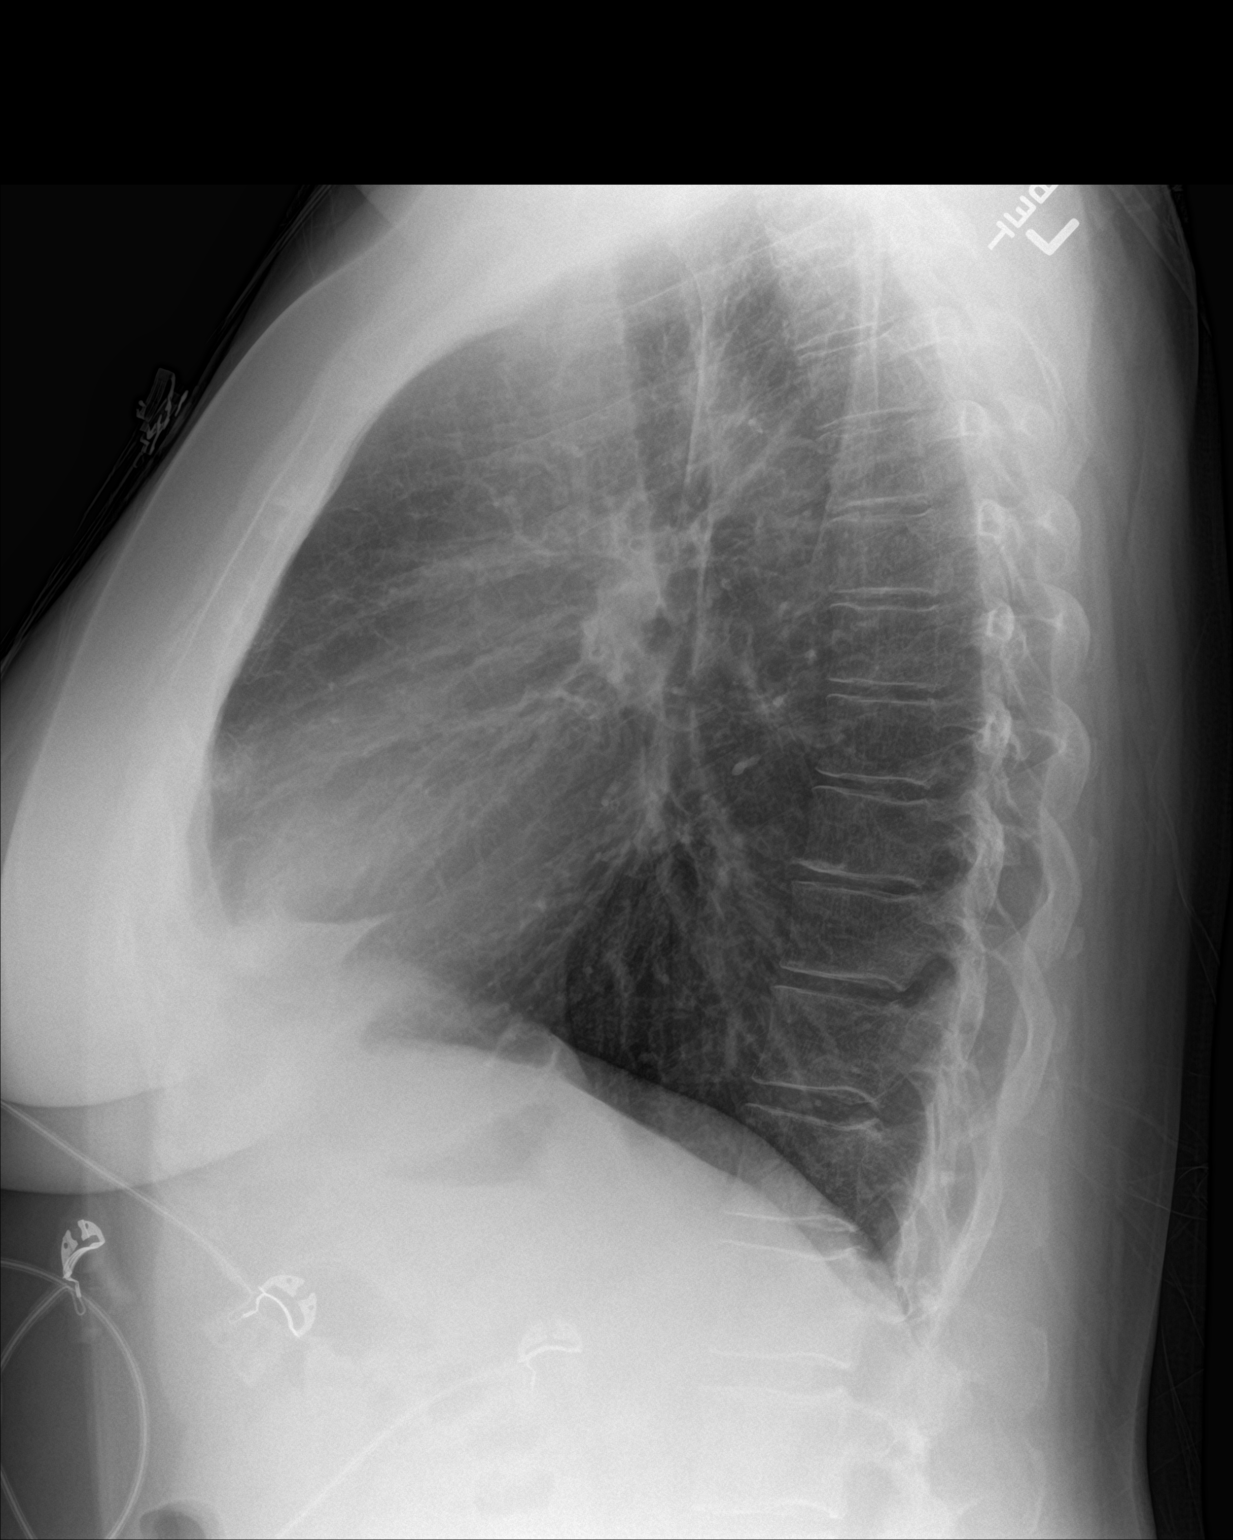

[2 of 2 positions shown; findings below may reference images not displayed]

FINDINGS: Mild hyperinflation. No airspace consolidation. No effusions. Normal
pulmonary vasculature. Normal hilar and mediastinal contours. Heart
size is normal, unchanged.
IMPRESSION: Mild hyperinflation.

## 2017-10-15 IMAGING — CR DG CHEST 1V PORT
1 series · 1 of 1 positions shown · non-contrast
Comparison: Chest radiograph performed 06/25/2016

CLINICAL DATA: Acute onset of shortness of breath and wheezing.
Initial encounter.

EXAM:
PORTABLE CHEST 1 VIEW

[portable]
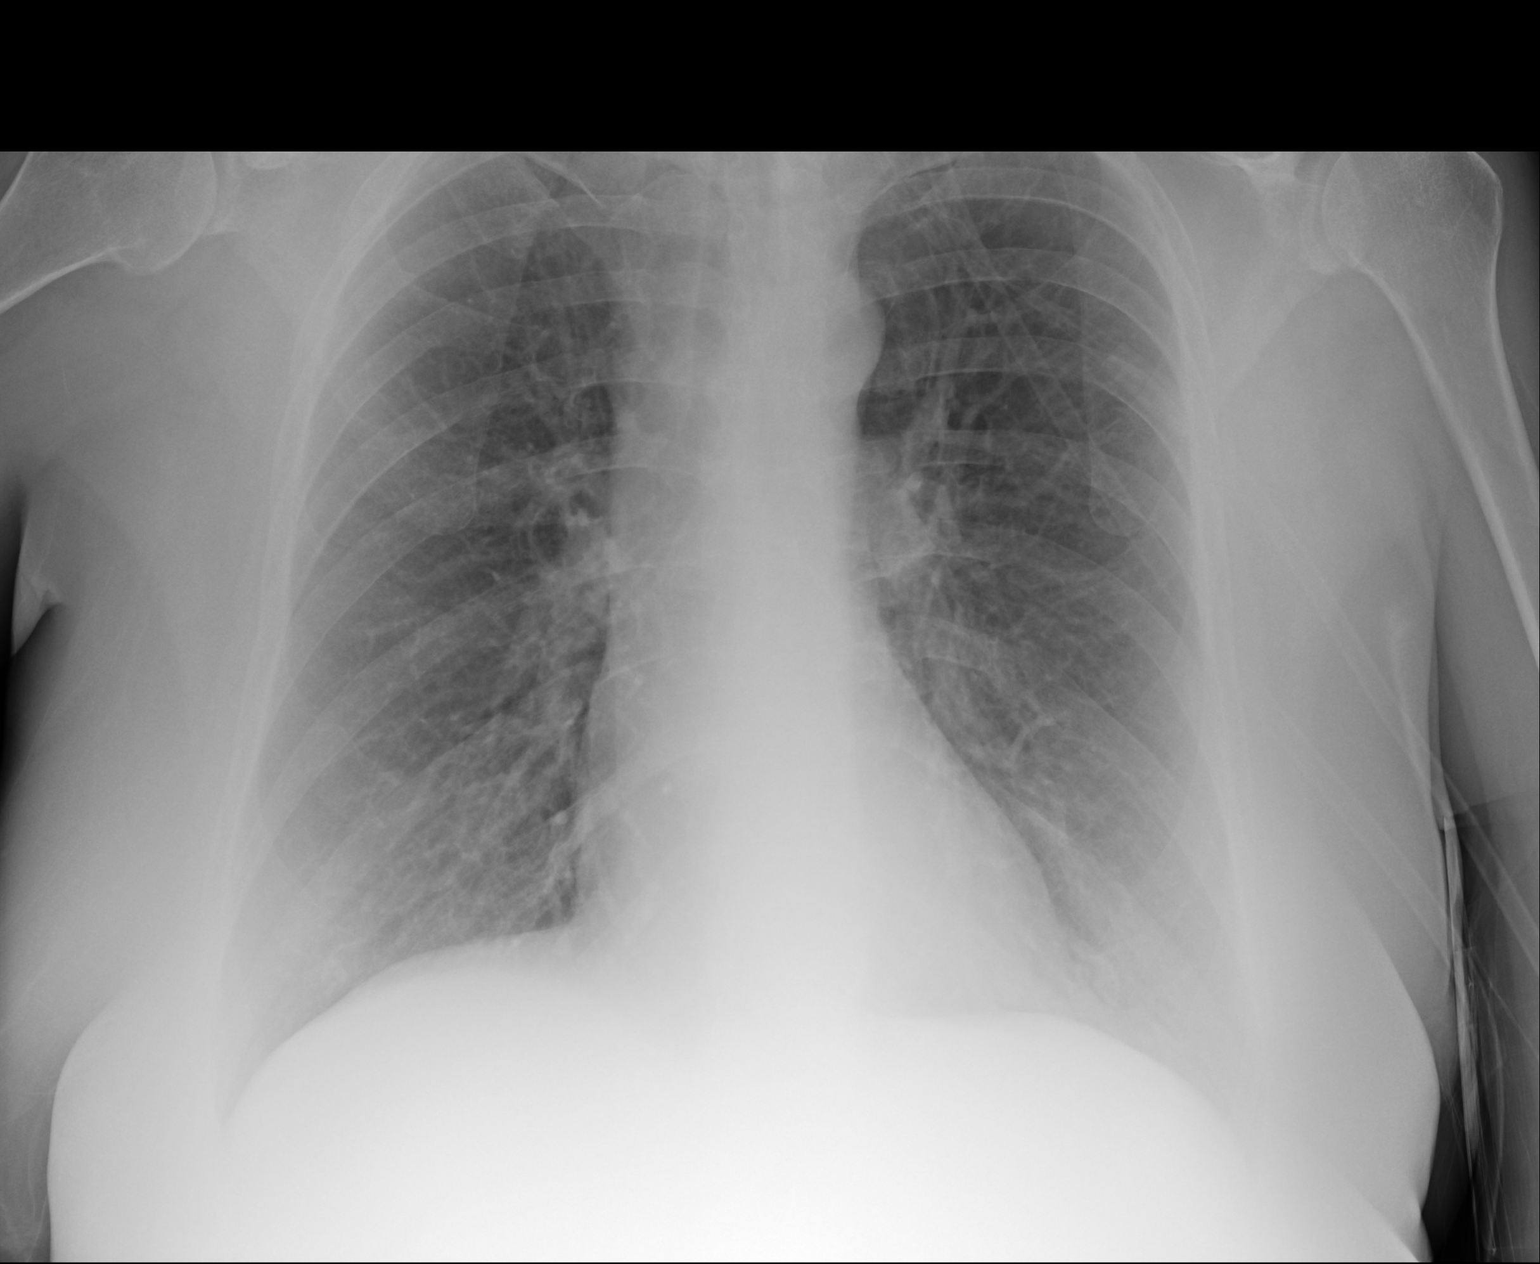

[1 of 1 positions shown; findings below may reference images not displayed]

FINDINGS: The lungs are well-aerated. Mild vascular congestion is noted. There
is no evidence of focal opacification, pleural effusion or
pneumothorax.

The cardiomediastinal silhouette is within normal limits. No acute
osseous abnormalities are seen.
IMPRESSION: Mild vascular congestion noted.  Lungs remain grossly clear.

## 2018-01-19 ENCOUNTER — Other Ambulatory Visit (HOSPITAL_COMMUNITY): Payer: Self-pay | Admitting: Oncology

## 2018-02-03 IMAGING — CT CT ABD-PELV W/ CM
2 of 5 series · 15 of 46 positions shown, 17 images · IV contrast (Isovue)
Comparison: 11/28/2004 CT abdomen and pelvis.  07/03/2016 chest CT.

CLINICAL DATA: 45-year-old female with diffuse abdominal pain and
distention for 2 days. History of ulcer colitis. Polysubstance
abuse. Ovarian cyst. Initial encounter.

EXAM:
CT ABDOMEN AND PELVIS WITH CONTRAST
TECHNIQUE: Multidetector CT imaging of the abdomen and pelvis was performed
using the standard protocol following bolus administration of
intravenous contrast.
CONTRAST:  15mL 8M548W-0LL IOPAMIDOL (8M548W-0LL) INJECTION 61%,
100mL 8M548W-0LL IOPAMIDOL (8M548W-0LL) INJECTION 61%

[Series 2: axial st · axial · 0.74mm/px · z∈[-477,-52]mm · 12 of 97 slices shown, 14 images]
[im 6/97  soft-tissue]
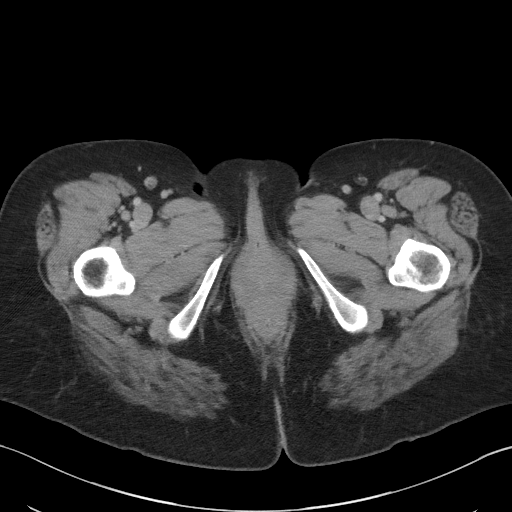
[im 6/97  bone]
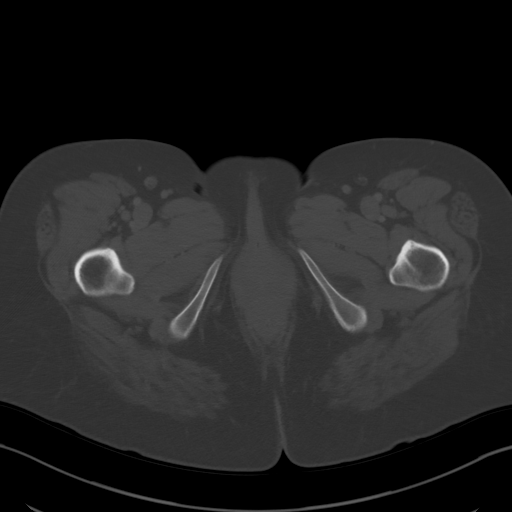
[im 17/97  soft-tissue]
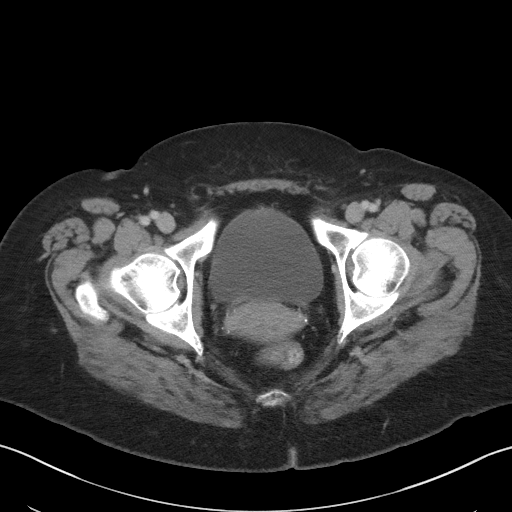
[im 23/97  soft-tissue]
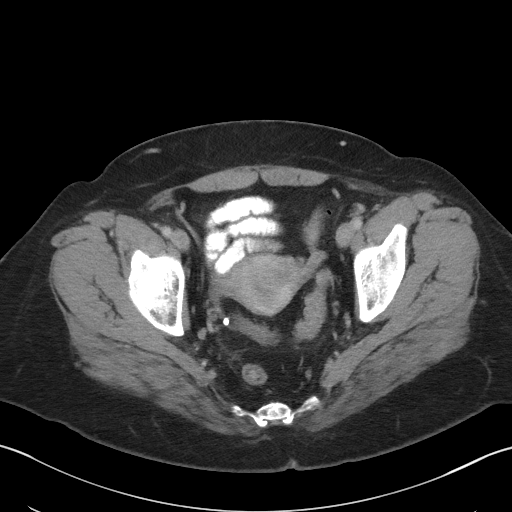
[im 29/97  soft-tissue]
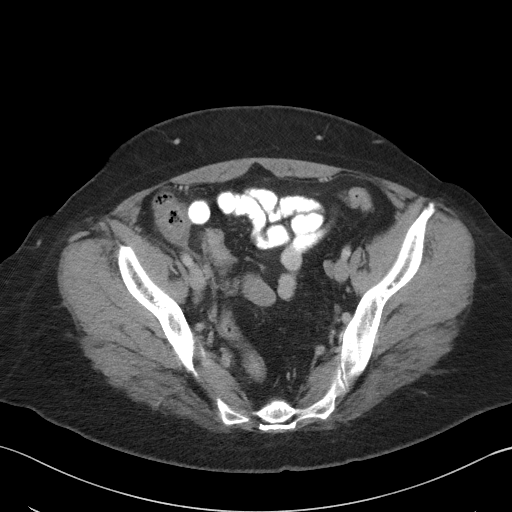
[im 40/97  soft-tissue]
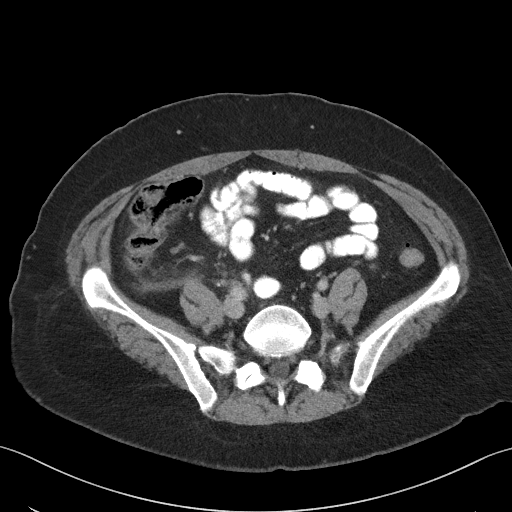
[im 46/97  soft-tissue]
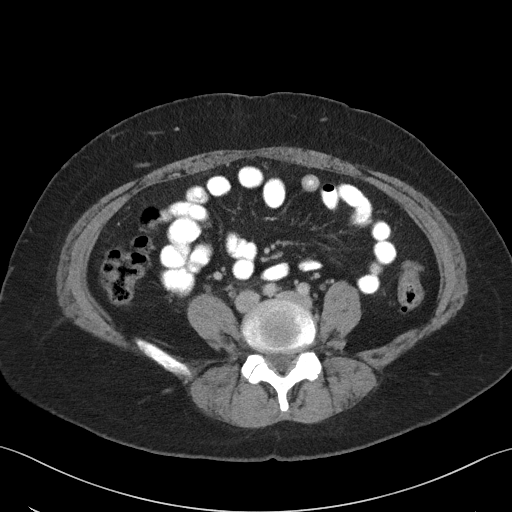
[im 51/97  soft-tissue]
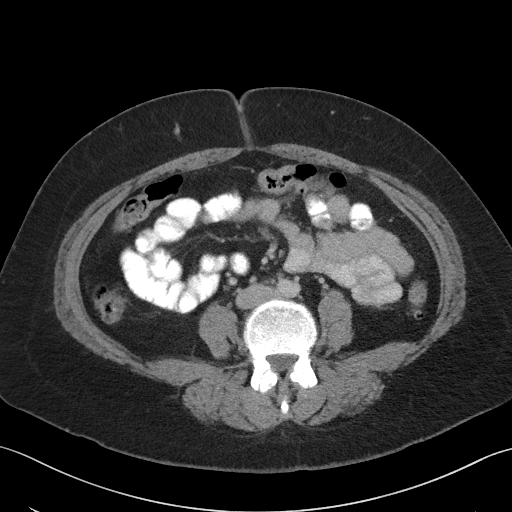
[im 63/97  soft-tissue]
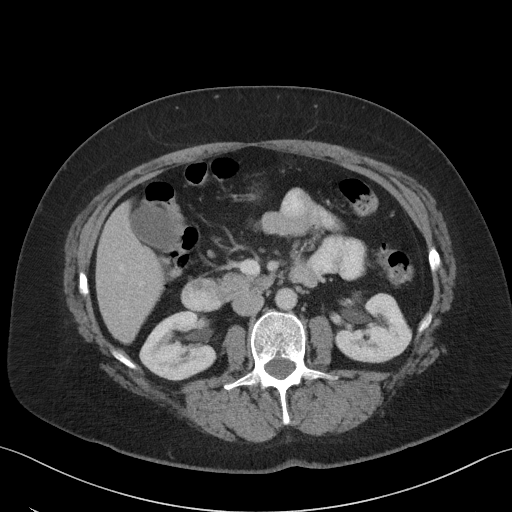
[im 68/97  soft-tissue]
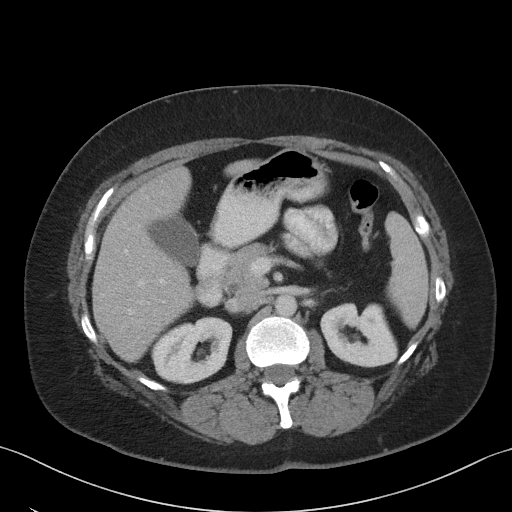
[im 68/97  bone]
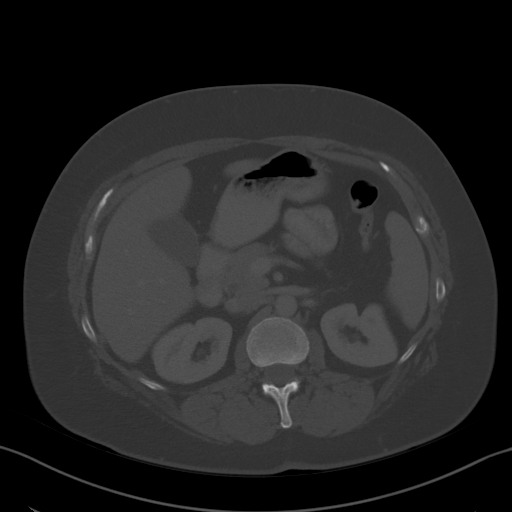
[im 74/97  soft-tissue]
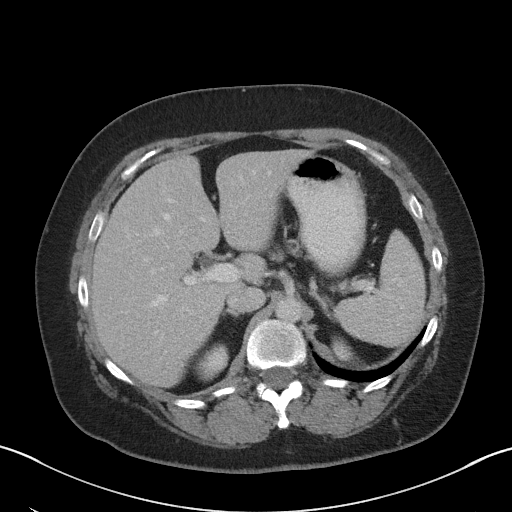
[im 85/97  soft-tissue]
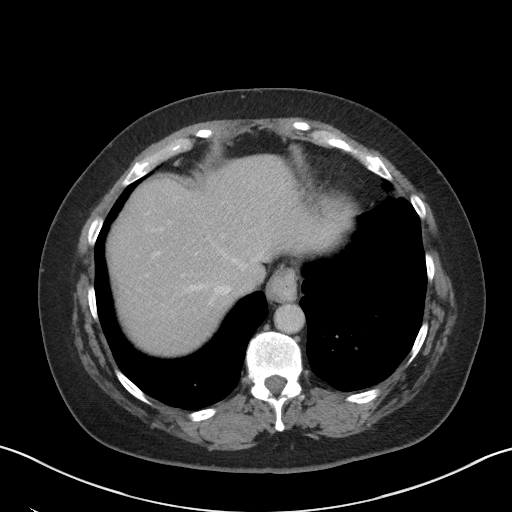
[im 91/97  soft-tissue]
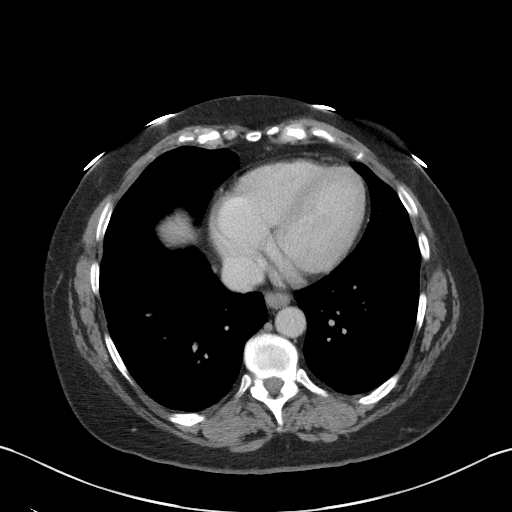

[Series 5: coronal st · coronal · 0.76mm/px · 3 of 93 slices shown]
[im 31/93  soft-tissue]
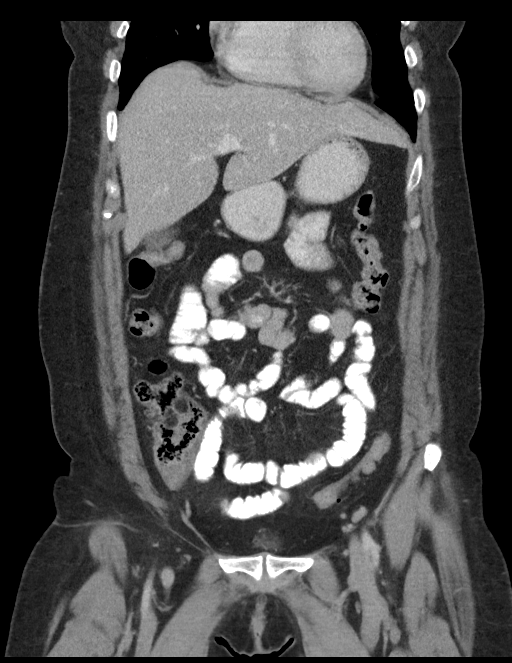
[im 41/93  soft-tissue]
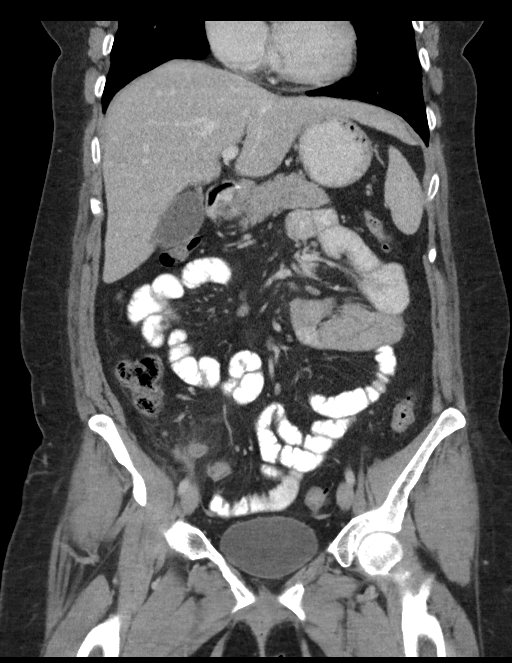
[im 52/93  soft-tissue]
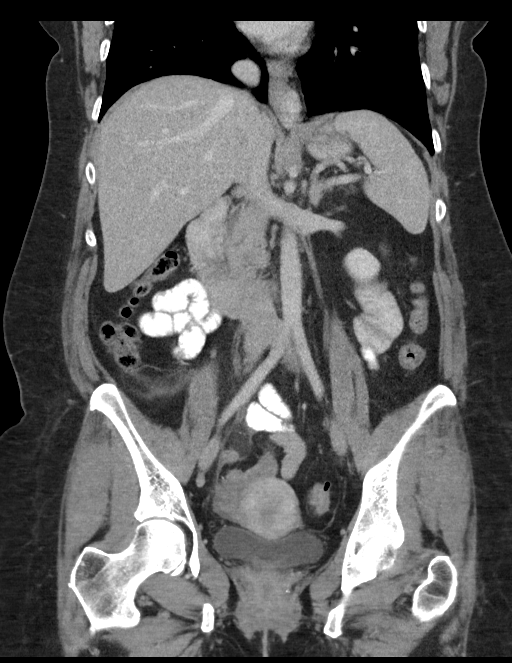

[15 of 46 positions shown; findings below may reference images not displayed]

FINDINGS: Lower chest: Minimal linear scarring/ atelectasis. Low-density
structure surrounds the inferior aspect of the right lower lobe
pulmonary vein. This is new compared to prior CT although
incompletely assessed and can be evaluated on follow-up chest CT.

Hepatobiliary: No hepatic lesion or calcified gallstone.

Pancreas: No pancreatic lesion or inflammation.

Spleen: No mass or enlargement.

Adrenals/Urinary Tract: No renal or adrenal mass. Lobulated contour
probably congenital rather than scarring. No renal or ureteral
obstructing stone or hydronephrosis.

Stomach/Bowel: Diffuse enlarged inflamed appendix without deep
drainable abscess. The appendix extends posteriorly and inferiorly.

The terminal ileum is slightly irregular however the inflammatory
process is centered at the level of the appendix.

Scattered colonic diverticula most notable descending colon sigmoid
colon without surrounding inflammation. Evaluation of bowel limited
by under distension. Fat within the wall [DATE] reflect changes of
prior colitis.

Vascular/Lymphatic: No abdominal aortic aneurysm or large vessel
occlusion.

Scattered small lymph nodes without adenopathy.

Reproductive: Slightly lobulated contour of the uterus suggestive of
small fibroids.

Other: No free air.

Musculoskeletal: Degenerative changes L3-4
IMPRESSION: Diffuse enlarged inflamed appendix without deep drainable abscess.
The appendix extends posteriorly and inferiorly.

Terminal ileum is slightly irregular however the inflammatory
process is centered at the level of the appendix.

Colonic diverticulosis.

Low-density structure surrounds the inferior aspect of the right
lower lobe pulmonary vein. This is new compared to prior CT although
incompletely assessed and can be evaluated on follow-up chest CT.

Slightly lobulated contour of the uterus suggestive of small
fibroids.

These results were called by telephone at the time of interpretation
on 10/22/2016 at [DATE] to Dr. GABRIELA DYE , who verbally
acknowledged these results.

## 2018-06-26 ENCOUNTER — Ambulatory Visit (HOSPITAL_COMMUNITY): Payer: Medicaid Other | Admitting: Internal Medicine

## 2018-09-10 ENCOUNTER — Other Ambulatory Visit: Payer: Self-pay

## 2018-09-10 ENCOUNTER — Emergency Department (HOSPITAL_COMMUNITY)
Admission: EM | Admit: 2018-09-10 | Discharge: 2018-09-10 | Disposition: A | Discharge status: Home / Self Care | Payer: Medicaid Other | Attending: Emergency Medicine | Admitting: Emergency Medicine

## 2018-09-10 ENCOUNTER — Encounter (HOSPITAL_COMMUNITY): Payer: Self-pay

## 2018-09-10 ENCOUNTER — Emergency Department (HOSPITAL_COMMUNITY): Payer: Medicaid Other

## 2018-09-10 DIAGNOSIS — J45909 Unspecified asthma, uncomplicated: Secondary | ICD-10-CM | POA: Diagnosis not present

## 2018-09-10 DIAGNOSIS — R7303 Prediabetes: Secondary | ICD-10-CM | POA: Diagnosis not present

## 2018-09-10 DIAGNOSIS — Z7901 Long term (current) use of anticoagulants: Secondary | ICD-10-CM | POA: Insufficient documentation

## 2018-09-10 DIAGNOSIS — J441 Chronic obstructive pulmonary disease with (acute) exacerbation: Secondary | ICD-10-CM | POA: Insufficient documentation

## 2018-09-10 DIAGNOSIS — R0602 Shortness of breath: Secondary | ICD-10-CM | POA: Diagnosis not present

## 2018-09-10 DIAGNOSIS — Z86711 Personal history of pulmonary embolism: Secondary | ICD-10-CM | POA: Diagnosis not present

## 2018-09-10 DIAGNOSIS — Z79899 Other long term (current) drug therapy: Secondary | ICD-10-CM | POA: Insufficient documentation

## 2018-09-10 DIAGNOSIS — R000 Tachycardia, unspecified: Secondary | ICD-10-CM | POA: Diagnosis not present

## 2018-09-10 DIAGNOSIS — J80 Acute respiratory distress syndrome: Secondary | ICD-10-CM | POA: Diagnosis not present

## 2018-09-10 DIAGNOSIS — R0689 Other abnormalities of breathing: Secondary | ICD-10-CM | POA: Diagnosis not present

## 2018-09-10 DIAGNOSIS — R0902 Hypoxemia: Secondary | ICD-10-CM | POA: Diagnosis not present

## 2018-09-10 LAB — POC URINE PREG, ED: Preg Test, Ur: NEGATIVE

## 2018-09-10 LAB — COMPREHENSIVE METABOLIC PANEL
ALBUMIN: 3.4 g/dL — AB (ref 3.5–5.0)
ALT: 17 U/L (ref 0–44)
AST: 20 U/L (ref 15–41)
Alkaline Phosphatase: 73 U/L (ref 38–126)
Anion gap: 7 (ref 5–15)
BILIRUBIN TOTAL: 0.5 mg/dL (ref 0.3–1.2)
BUN: 12 mg/dL (ref 6–20)
CHLORIDE: 97 mmol/L — AB (ref 98–111)
CO2: 33 mmol/L — ABNORMAL HIGH (ref 22–32)
Calcium: 8.7 mg/dL — ABNORMAL LOW (ref 8.9–10.3)
Creatinine, Ser: 0.7 mg/dL (ref 0.44–1.00)
GFR calc Af Amer: >60 mL/min (ref >60)
Glucose, Bld: 199 mg/dL — ABNORMAL HIGH (ref 70–99)
POTASSIUM: 4.6 mmol/L (ref 3.5–5.1)
Sodium: 137 mmol/L (ref 135–145)
TOTAL PROTEIN: 6.8 g/dL (ref 6.5–8.1)

## 2018-09-10 LAB — CBC WITH DIFFERENTIAL/PLATELET
Abs Immature Granulocytes: 0.01 10*3/uL (ref 0.00–0.07)
BASOS ABS: 0.1 10*3/uL (ref 0.0–0.1)
BASOS PCT: 1 %
EOS ABS: 0.3 10*3/uL (ref 0.0–0.5)
EOS PCT: 4 %
HEMATOCRIT: 39 % (ref 36.0–46.0)
Hemoglobin: 12.1 g/dL (ref 12.0–15.0)
IMMATURE GRANULOCYTES: 0 %
Lymphocytes Relative: 14 %
Lymphs Abs: 1.1 10*3/uL (ref 0.7–4.0)
MCH: 27.7 pg (ref 26.0–34.0)
MCHC: 31 g/dL (ref 30.0–36.0)
MCV: 89.2 fL (ref 80.0–100.0)
Monocytes Absolute: 0.6 10*3/uL (ref 0.1–1.0)
Monocytes Relative: 7 %
NEUTROS PCT: 74 %
NRBC: 0 % (ref 0.0–0.2)
Neutro Abs: 5.6 10*3/uL (ref 1.7–7.7)
PLATELETS: 384 10*3/uL (ref 150–400)
RBC: 4.37 MIL/uL (ref 3.87–5.11)
RDW: 13.8 % (ref 11.5–15.5)
WBC: 7.7 10*3/uL (ref 4.0–10.5)

## 2018-09-10 LAB — TROPONIN I

## 2018-09-10 LAB — D-DIMER, QUANTITATIVE (NOT AT ARMC): D DIMER QUANT: 0.5 ug{FEU}/mL (ref 0.00–0.50)

## 2018-09-10 MED ORDER — PREDNISONE 10 MG PO TABS: 20.0000 mg | ORAL_TABLET | Freq: Every day | ORAL | 0 refills | Status: DC

## 2018-09-10 MED ORDER — MAGNESIUM SULFATE 2 GM/50ML IV SOLN
2.0000 g | Freq: Once | INTRAVENOUS | Status: AC
  Administered 2018-09-10: 2 g via INTRAVENOUS
  Filled 2018-09-10: qty 50

## 2018-09-10 MED ORDER — DOXYCYCLINE HYCLATE 100 MG PO TABS
100.0000 mg | ORAL_TABLET | Freq: Once | ORAL | Status: AC
  Administered 2018-09-10: 100 mg via ORAL
  Filled 2018-09-10: qty 1

## 2018-09-10 MED ORDER — ALBUTEROL SULFATE (2.5 MG/3ML) 0.083% IN NEBU: 2.5000 mg | INHALATION_SOLUTION | Freq: Four times a day (QID) | RESPIRATORY_TRACT | 12 refills | Status: DC | PRN

## 2018-09-10 MED ORDER — DOXYCYCLINE HYCLATE 100 MG PO CAPS: 100.0000 mg | ORAL_CAPSULE | Freq: Two times a day (BID) | ORAL | 0 refills | Status: DC

## 2018-09-10 MED ORDER — ALBUTEROL SULFATE HFA 108 (90 BASE) MCG/ACT IN AERS
2.0000 | INHALATION_SPRAY | RESPIRATORY_TRACT | Status: DC | PRN
  Administered 2018-09-10: 2 via RESPIRATORY_TRACT
  Filled 2018-09-10 (×2): qty 6.7

## 2018-09-10 NOTE — Discharge Instructions (Addendum)
Follow-up with your doctor next week for recheck.  Return here if problems prior to that time

## 2018-09-10 NOTE — ED Triage Notes (Signed)
Pt reports sob for past few days and went to pcp.  Pt's sob worse today.  EMS administered 125solumedrol, duoneb, and 0.5 epi IM pta.  Pt says feels better but still wheezing.  EMS started 18g IV in left ac.  Reports pt's lung sounds diminished in lower lobes and wheezing.

## 2018-09-10 NOTE — ED Provider Notes (Signed)
[facility]ERGENCY DEPARTMENT Provider Note   CSN: [id number] Arrival date & time: 09/10/18  1057     History   Chief Complaint Chief Complaint  Patient presents with  . Shortness of Breath    HPI Melanie BeaverKaren D Mendoza is a 47 y.o. female.  Patient complains of cough and shortness of breath.  She has a history of a PE and presently is not taking any blood thinner.  The history is provided by the patient. No language interpreter was used.  Shortness of Breath  This is a new problem. The problem occurs continuously.The current episode started 2 days ago. The problem has not changed since onset.Associated symptoms include wheezing. Pertinent negatives include no fever, no headaches, no cough, no chest pain, no abdominal pain and no rash. Precipitated by: Unknown. Risk factors: COPD. She has tried ipratropium inhalers for the symptoms. The treatment provided no relief. She has had prior hospitalizations. She has had prior ED visits. Associated medical issues include PE.    Past Medical History:  Diagnosis Date  . Acute pulmonary embolism (HCC) 09/13/2013  . Asthma   . Bilateral ovarian cysts   . PE (pulmonary embolism) 09/2013  . Polysubstance abuse (HCC)   . Ulcerative colitis [facility](HCC)     Patient Active Problem List   Diagnosis Date Noted  . Acute appendicitis 10/22/2016  . Asthma exacerbation 07/03/2016  . Hyperlipidemia 07/01/2016  . Prediabetes 07/01/2016  . Long term (current) use of anticoagulants [Z79.01] 05/08/2016  . On anticoagulant therapy 04/16/2016  . Obesity, unspecified 04/16/2016  . Manipulative behavior 09/13/2013  . Acute pulmonary embolism (HCC) 09/13/2013  . Asthma with bronchitis 09/08/2013  . Polysubstance abuse (HCC) 09/08/2013  . Narcotic abuse (HCC) 09/08/2013  . Hypoxia 09/08/2013  . DVT of leg (deep venous thrombosis) (HCC) 09/08/2013  . Anemia 09/08/2013  . Overdose of benzodiazepine 08/20/2013  . Acute encephalopathy 08/20/2013  . Acute  respiratory failure (HCC) 08/20/2013  . Asthma 08/20/2013  . Leukocytosis 08/20/2013    Past Surgical History:  Procedure Laterality Date  . LAPAROSCOPIC APPENDECTOMY N/A 10/23/2016   Procedure: APPENDECTOMY LAPAROSCOPIC;  Surgeon: Franky MachoMark Jenkins, MD;  Location: [facility] ORS;  Service: General;  Laterality: N/A;     OB History    Gravida      Para      Term      Preterm      AB      Living  2     SAB      TAB      Ectopic      Multiple      Live Births               Home Medications    Prior to Admission medications   Medication Sig Start Date End Date Taking? Authorizing Provider  acetaminophen (TYLENOL) 500 MG tablet Take 1,000 mg by mouth every 6 (six) hours as needed for moderate pain.   Yes [provider]  buprenorphine-naloxone (SUBOXONE) 8-2 mg SUBL SL tablet Place 1 tablet under the tongue daily.   Yes [provider]  fluticasone (FLOVENT HFA) 110 MCG/ACT inhaler Inhale 2 puffs into the lungs daily. 07/04/16  Yes Philip AspenHernandez Acosta, Limmie PatriciaEstela Y, MD  gabapentin (NEURONTIN) 100 MG capsule Take 200 mg by mouth 4 (four) times daily.   Yes [provider]  QUEtiapine (SEROQUEL) 300 MG tablet Take 300 mg by mouth at bedtime.   Yes [provider]  rivaroxaban (XARELTO) 20 MG TABS tablet Take 1  tablet (20 mg total) by mouth daily with supper. 07/30/16  Yes Kefalas, Maurine Minister, PA-C  sulfaSALAzine (AZULFIDINE) 500 MG tablet Take 500 mg by mouth 2 (two) times daily.    Yes [provider]  albuterol (PROVENTIL) (2.5 MG/3ML) 0.083% nebulizer solution Take 3 mLs (2.5 mg total) by nebulization every 6 (six) hours as needed for wheezing or shortness of breath. 09/10/18   Bethann Berkshire, MD  doxycycline (VIBRAMYCIN) 100 MG capsule Take 1 capsule (100 mg total) by mouth 2 (two) times daily. One po bid x 7 days 09/10/18   Bethann Berkshire, MD  mometasone-formoterol [facility]) 100-5 MCG/ACT AERO Inhale 2 puffs into the lungs 2 (two) times  daily. Patient not taking: Reported on 09/10/2018 09/10/16   Jacquelin Hawking, PA-C  predniSONE (DELTASONE) 10 MG tablet Take 2 tablets (20 mg total) by mouth daily. 09/10/18   Bethann Berkshire, MD    Family History Family History  Problem Relation Age of Onset  . Lung cancer Mother   . Cancer Mother     Social History Social History   Tobacco Use  . Smoking status: Never Smoker  . Smokeless tobacco: Never Used  Substance Use Topics  . Alcohol use: No  . Drug use: No    Types: Marijuana, Benzodiazepines, Oxycodone    Comment: none since 2014     Allergies   Patient has no known allergies.   Review of Systems Review of Systems  Constitutional: Negative for appetite change, fatigue and fever.  HENT: Negative for congestion, ear discharge and sinus pressure.   Eyes: Negative for discharge.  Respiratory: Positive for shortness of breath and wheezing. Negative for cough.   Cardiovascular: Negative for chest pain.  Gastrointestinal: Negative for abdominal pain and diarrhea.  Genitourinary: Negative for frequency and hematuria.  Musculoskeletal: Negative for back pain.  Skin: Negative for rash.  Neurological: Negative for seizures and headaches.  Psychiatric/Behavioral: Negative for hallucinations.     Physical Exam Updated Vital Signs BP (!) 175/103   Pulse 78   Temp 97.9 F (36.6 C) (Oral)   Resp 19   Ht 5\' 7"  (1.702 m)   Wt 81.6 kg   LMP  (Approximate) Comment: 3mos ago  SpO2 94%   BMI 28.19 kg/m   Physical Exam  Constitutional: She is oriented to person, place, and time. She appears well-developed.  HENT:  Head: Normocephalic.  Eyes: Conjunctivae and EOM are normal. No scleral icterus.  Neck: Neck supple. No thyromegaly present.  Cardiovascular: Normal rate and regular rhythm. Exam reveals no gallop and no friction rub.  No murmur heard. Pulmonary/Chest: No stridor. She has wheezes. She has no rales. She exhibits no tenderness.  Abdominal: She exhibits  no distension. There is no tenderness. There is no rebound.  Musculoskeletal: Normal range of motion. She exhibits no edema.  Lymphadenopathy:    She has no cervical adenopathy.  Neurological: She is oriented to person, place, and time. She exhibits normal muscle tone. Coordination normal.  Skin: No rash noted. No erythema.  Psychiatric: She has a normal mood and affect. Her behavior is normal.     ED Treatments / Results  Labs (all labs ordered are listed, but only abnormal results are displayed) Labs Reviewed  COMPREHENSIVE METABOLIC PANEL - Abnormal; Notable for the following components:      Result Value   Chloride 97 (*)    CO2 33 (*)    Glucose, Bld 199 (*)    Calcium 8.7 (*)    Albumin 3.4 (*)  All other components within normal limits  CBC WITH DIFFERENTIAL/PLATELET  TROPONIN I  D-DIMER, QUANTITATIVE (NOT AT [facility])  POC URINE PREG, ED    EKG EKG Interpretation  Date/Time:  Thursday September 10 2018 11:02:15 EST Ventricular Rate:  81 PR Interval:    QRS Duration: 83 QT Interval:  375 QTC Calculation: 433 R Axis:   72 Text Interpretation:  Sinus rhythm ST elev, probable normal early repol pattern Confirmed by Bethann Berkshire [id number]) on 09/10/2018 12:35:32 PM   Radiology Dg Chest 2 View  Result Date: 09/10/2018 CLINICAL DATA:  Shortness of breath since last night, history of pulmonary embolism, asthma EXAM: CHEST - 2 VIEW COMPARISON:  07/03/2016 FINDINGS: Upper normal heart size with slight vascular congestion. Mediastinal contours normal. Mild peribronchial thickening without infiltrate, pleural effusion or pneumothorax. Bones demineralized. IMPRESSION: Bronchitic changes without infiltrate. Electronically Signed   By: Ulyses Southward M.D.   On: 09/10/2018 12:01    Procedures Procedures (including critical care time)  Medications Ordered in ED Medications  magnesium sulfate IVPB 2 g 50 mL (2 g Intravenous New Bag/Given 09/10/18 1143)  albuterol (PROVENTIL  HFA;VENTOLIN HFA) 108 (90 Base) MCG/ACT inhaler 2 puff (has no administration in time range)  doxycycline (VIBRA-TABS) tablet 100 mg (has no administration in time range)     Initial Impression / Assessment and Plan / ED Course  I have reviewed the triage vital signs and the nursing notes.  Pertinent labs & imaging results that were available during my care of the patient were reviewed by me and considered in my medical decision making (see chart for details).     Patient with exacerbation of COPD.  Patient improved with treatment in the emergency department.  She will be sent home with albuterol inhaler along with a prescription for her albuterol and her neb machine.  She was given prednisone and doxycycline  Final Clinical Impressions(s) / ED Diagnoses   Final diagnoses:  COPD exacerbation [facility])    ED Discharge Orders         Ordered    predniSONE (DELTASONE) 10 MG tablet  Daily     09/10/18 1241    doxycycline (VIBRAMYCIN) 100 MG capsule  2 times daily     09/10/18 1241    albuterol (PROVENTIL) (2.5 MG/3ML) 0.083% nebulizer solution  Every 6 hours PRN     09/10/18 1241           Bethann Berkshire, MD 09/10/18 1245

## 2018-10-28 DIAGNOSIS — R410 Disorientation, unspecified: Secondary | ICD-10-CM | POA: Diagnosis not present

## 2019-04-05 ENCOUNTER — Telehealth: Payer: Self-pay | Admitting: Adult Health

## 2019-04-05 ENCOUNTER — Telehealth: Payer: Self-pay | Admitting: *Deleted

## 2019-04-05 NOTE — Telephone Encounter (Signed)
Patient called, she stated that she has an infection, odor, discharge and itching x 1 week.  She would like something for it or to be seen.  [employer]  [phone]

## 2019-04-05 NOTE — Telephone Encounter (Signed)
Patient states she has had a discharge for the last several weeks. Itching, odor.  Informed patient the next available is not until next Tuesday for new gyn.  Pt Verbalized understanding.

## 2019-04-13 ENCOUNTER — Encounter: Payer: Medicaid Other | Admitting: Women's Health

## 2019-05-18 ENCOUNTER — Telehealth: Payer: Self-pay | Admitting: Adult Health

## 2019-05-18 NOTE — Telephone Encounter (Signed)

## 2019-05-19 ENCOUNTER — Ambulatory Visit (INDEPENDENT_AMBULATORY_CARE_PROVIDER_SITE_OTHER): Payer: Medicaid Other | Admitting: Adult Health

## 2019-05-19 ENCOUNTER — Encounter: Payer: Self-pay | Admitting: Adult Health

## 2019-05-19 ENCOUNTER — Other Ambulatory Visit: Payer: Self-pay

## 2019-05-19 ENCOUNTER — Other Ambulatory Visit (HOSPITAL_COMMUNITY)
Admission: RE | Admit: 2019-05-19 | Discharge: 2019-05-19 | Disposition: A | Discharge status: Home / Self Care | Payer: Medicaid Other | Source: Ambulatory Visit | Attending: Adult Health | Admitting: Adult Health

## 2019-05-19 VITALS — BP 127/74 | HR 77 | Ht 67.0 in | Wt 184.0 lb

## 2019-05-19 DIAGNOSIS — N6321 Unspecified lump in the left breast, upper outer quadrant: Secondary | ICD-10-CM | POA: Insufficient documentation

## 2019-05-19 DIAGNOSIS — Z01419 Encounter for gynecological examination (general) (routine) without abnormal findings: Secondary | ICD-10-CM | POA: Diagnosis not present

## 2019-05-19 DIAGNOSIS — Z0000 Encounter for general adult medical examination without abnormal findings: Secondary | ICD-10-CM

## 2019-05-19 DIAGNOSIS — N951 Menopausal and female climacteric states: Secondary | ICD-10-CM

## 2019-05-19 DIAGNOSIS — Z1211 Encounter for screening for malignant neoplasm of colon: Secondary | ICD-10-CM

## 2019-05-19 DIAGNOSIS — J4520 Mild intermittent asthma, uncomplicated: Secondary | ICD-10-CM

## 2019-05-19 DIAGNOSIS — Z1212 Encounter for screening for malignant neoplasm of rectum: Secondary | ICD-10-CM | POA: Diagnosis not present

## 2019-05-19 DIAGNOSIS — R062 Wheezing: Secondary | ICD-10-CM | POA: Insufficient documentation

## 2019-05-19 DIAGNOSIS — Z113 Encounter for screening for infections with a predominantly sexual mode of transmission: Secondary | ICD-10-CM | POA: Insufficient documentation

## 2019-05-19 LAB — HEMOCCULT GUIAC POC 1CARD (OFFICE): Fecal Occult Blood, POC: NEGATIVE

## 2019-05-19 NOTE — Progress Notes (Signed)
Patient ID: Melanie Mendoza, female   DOB: 1971/04/16, 48 y.o.   MRN: [id number] History of Present Illness: Melanie Mendoza is a 48 year old white female, single, G2P2 in for a well woman gyn exam and pap.She is having irregular periods, skipped 6 months then had one and has hot flashes. No PCP    Current Medications, Allergies, Past Medical History, Past Surgical History, Family History and Social History were reviewed in [employer] record.     Review of Systems: Patient denies any headaches, hearing loss, fatigue, blurred vision, chest pain, abdominal pain, problems with bowel movements, urination, or intercourse. No joint pain or mood swings.has noticed discharge with odor, but douched today +shortness of breath and wheezing has asthma, see HPV for positives    Physical Exam:BP 127/74 (BP Location: Right Arm, Patient Position: Sitting, Cuff Size: Normal)   Pulse 77   Ht 5\' 7"  (1.702 m)   Wt 184 lb (83.5 kg)   LMP 04/28/2019   BMI 28.82 kg/m  General:  Well developed, well nourished, no acute distress Skin:  Warm and dry Neck:  Midline trachea, normal thyroid, good ROM, no lymphadenopathy Lungs;+wheezing and decreased air flow. Breast:  No dominant palpable mass, retraction, or nipple discharge on right, on left no retraction or nipple discharge, but has thickness, ?mass at 2-4 o'clock, had ?fibroadenoma in 2017 at [facility] Cardiovascular: Regular rate and rhythm Abdomen:  Soft, non tender, no hepatosplenomegaly Pelvic:  External genitalia is normal in appearance, no lesions.  The vagina is normal in appearance. Urethra has no lesions or masses. The cervix is bulbous.Pap with GC/CHL and HPV 16/18 reflex genotyping performed.   Uterus is felt to be normal size, shape, and contour.  No adnexal masses or tenderness noted.Bladder is non tender, no masses felt. Rectal: Good sphincter tone, no polyps, or hemorrhoids felt.  Hemoccult negative. Extremities/musculoskeletal:  No  swelling or varicosities noted, no clubbing or cyanosis Psych:  No mood changes, alert and cooperative,seems happy PHQ 2 score is 0 Fall risk is low Examination chaperoned by [employer] FNP student, who also did breast exam with me  Impression: 1. Well woman exam with routine gynecological exam   2. Screening for colorectal cancer   3. Wheezing   4. Intermittent asthma without complication, unspecified asthma severity   5. Mass of upper outer quadrant of left breast   6. Peri-menopause       Plan: Pap with GC/CHL HPV 16/18 genotyping sent Chest xray today Diagnostic mammogram and [place] at [facility] 8/18 at 3 pm Physical in 1 year Pap in 3 if normal Don't douche Colonoscopy with Dr Laural Golden advised  Go back to [facility] for PCP  Discussed could not take HRT due to history of PE.

## 2019-05-24 LAB — CYTOLOGY - PAP
Chlamydia: NEGATIVE
Diagnosis: NEGATIVE
HPV: NOT DETECTED
Neisseria Gonorrhea: NEGATIVE

## 2019-06-01 ENCOUNTER — Inpatient Hospital Stay (HOSPITAL_COMMUNITY): Admission: RE | Admit: 2019-06-01 | Payer: Medicaid Other | Source: Ambulatory Visit

## 2019-06-01 ENCOUNTER — Ambulatory Visit (HOSPITAL_COMMUNITY): Payer: Medicaid Other | Attending: Adult Health

## 2019-06-01 ENCOUNTER — Encounter (HOSPITAL_COMMUNITY): Payer: Self-pay

## 2019-06-01 ENCOUNTER — Ambulatory Visit (HOSPITAL_COMMUNITY): Admission: RE | Admit: 2019-06-01 | Payer: Medicaid Other | Source: Ambulatory Visit

## 2019-08-11 ENCOUNTER — Encounter (HOSPITAL_COMMUNITY): Payer: Self-pay | Admitting: Emergency Medicine

## 2019-08-11 ENCOUNTER — Other Ambulatory Visit: Payer: Self-pay

## 2019-08-11 ENCOUNTER — Emergency Department (HOSPITAL_COMMUNITY)
Admission: EM | Admit: 2019-08-11 | Discharge: 2019-08-11 | Disposition: A | Discharge status: Home / Self Care | Payer: Medicaid Other | Attending: Emergency Medicine | Admitting: Emergency Medicine

## 2019-08-11 DIAGNOSIS — Z5321 Procedure and treatment not carried out due to patient leaving prior to being seen by health care provider: Secondary | ICD-10-CM | POA: Insufficient documentation

## 2019-08-11 DIAGNOSIS — M549 Dorsalgia, unspecified: Secondary | ICD-10-CM | POA: Insufficient documentation

## 2019-08-11 NOTE — ED Triage Notes (Signed)
Patient reports L sided back pain that started 2 weeks ago. Radiation of symptoms down L leg. No problems with bowel or bladder. No known injury.

## 2019-08-24 ENCOUNTER — Ambulatory Visit
Admission: EM | Admit: 2019-08-24 | Discharge: 2019-08-24 | Disposition: A | Discharge status: Home / Self Care | Payer: Medicaid Other | Attending: Emergency Medicine | Admitting: Emergency Medicine

## 2019-08-24 ENCOUNTER — Other Ambulatory Visit: Payer: Self-pay

## 2019-08-24 DIAGNOSIS — M5442 Lumbago with sciatica, left side: Secondary | ICD-10-CM

## 2019-08-24 MED ORDER — CYCLOBENZAPRINE HCL 10 MG PO TABS: 10.0000 mg | ORAL_TABLET | Freq: Every day | ORAL | 0 refills | Status: DC

## 2019-08-24 MED ORDER — PREDNISONE 20 MG PO TABS: 20.0000 mg | ORAL_TABLET | Freq: Two times a day (BID) | ORAL | 0 refills | Status: AC

## 2019-08-24 MED ORDER — METHYLPREDNISOLONE SODIUM SUCC 125 MG IJ SOLR
125.0000 mg | Freq: Once | INTRAMUSCULAR | Status: AC
  Administered 2019-08-24: 125 mg via INTRAMUSCULAR

## 2019-08-24 NOTE — ED Triage Notes (Signed)
Pt presents with c/o low back pain radiating to left leg, pt states that she has had for past 2 weeks and reports no injury

## 2019-08-24 NOTE — Discharge Instructions (Signed)
Steroid shot given in office Continue conservative management of rest, ice, heat, and gentle stretches Prednisone prescribed.  Take as directed and to completion Take cyclobenzaprine at nighttime for symptomatic relief. Avoid driving or operating heavy machinery while using medication. Follow up with Dr. Holly Bodily to establish care and for further evaluation and management as needed Go to the ER if you have any new or worsening symptoms (fever, chills, chest pain, abdominal pain, changes in bowel or bladder habits, worsening symptoms despite medications, etc...)

## 2019-08-24 NOTE — ED Provider Notes (Signed)
[facility]   [id number] 08/24/19 Arrival Time: 1602  CC: Low back pain  SUBJECTIVE: History from: patient. Melanie Mendoza a 48 y.o. female hx significant for PE, asthma, polysubstance abuse, currently on suboxone, complains of low back pain that began 2 weeks ago.  Denies a precipitating event or specific injury, but does admit to raking leaves the other day.  Localizes the pain to the low back and radiates in left leg.  Describes the pain as constant and burning in character.  Rates pain as "10"/10, worse than "child birth."  Has tried OTC medications without relief.  Symptoms are made worse with Xx.  Denies similar symptoms in the past.  Denies fever, chills, erythema, ecchymosis, effusion, weakness, numbness and tingling, saddle paresthesias, loss of bowel or bladder function.      ROS: As per HPI.  All other pertinent ROS negative.     Past Medical History:  Diagnosis Date  . Acute pulmonary embolism (HCC) 09/13/2013  . Asthma   . Bilateral ovarian cysts   . PE (pulmonary embolism) 09/2013  . Polysubstance abuse (HCC)   . Ulcerative colitis [facility])    Past Surgical History:  Procedure Laterality Date  . LAPAROSCOPIC APPENDECTOMY N/A 10/23/2016   Procedure: APPENDECTOMY LAPAROSCOPIC;  Surgeon: Franky Macho, MD;  Location: [facility];  Service: General;  Laterality: N/A;   No Known Allergies No current facility-administered medications on file prior to encounter.    Current Outpatient Medications on File Prior to Encounter  Medication Sig Dispense Refill  . acetaminophen (TYLENOL) 500 MG tablet Take 1,000 mg by mouth every 6 (six) hours as needed for moderate pain.    Marland Kitchen albuterol (PROVENTIL) (2.5 MG/3ML) 0.083% nebulizer solution Take 3 mLs (2.5 mg total) by nebulization every 6 (six) hours as needed for wheezing or shortness of breath. 75 mL 12  . buprenorphine-naloxone (SUBOXONE) 8-2 mg SUBL SL tablet Place 1 tablet under the tongue daily.    . fluticasone (FLOVENT HFA) 110  MCG/ACT inhaler Inhale 2 puffs into the lungs daily. 1 Inhaler 12  . mometasone-formoterol (DULERA) 100-5 MCG/ACT AERO Inhale 2 puffs into the lungs 2 (two) times daily. 3 Inhaler 2  . QUEtiapine (SEROQUEL) 300 MG tablet Take 300 mg by mouth at bedtime.    . sulfaSALAzine (AZULFIDINE) 500 MG tablet Take 500 mg by mouth 2 (two) times daily.      Social History   Socioeconomic History  . Marital status: Single    Spouse name: Not on file  . Number of children: Not on file  . Years of education: Not on file  . Highest education level: Not on file  Occupational History  . Not on file  Social Needs  . Financial resource strain: Not on file  . Food insecurity    Worry: Not on file    Inability: Not on file  . Transportation needs    Medical: Not on file    Non-medical: Not on file  Tobacco Use  . Smoking status: Never Smoker  . Smokeless tobacco: Never Used  Substance and Sexual Activity  . Alcohol use: No  . Drug use: No    Types: Marijuana, Benzodiazepines, Oxycodone    Comment: none since 2014  . Sexual activity: Yes    Birth control/protection: None  Lifestyle  . Physical activity    Days per week: Not on file    Minutes per session: Not on file  . Stress: Not on file  Relationships  . Social connections  Talks on phone: Not on file    Gets together: Not on file    Attends religious service: Not on file    Active member of club or organization: Not on file    Attends meetings of clubs or organizations: Not on file    Relationship status: Not on file  . Intimate partner violence    Fear of current or ex partner: Not on file    Emotionally abused: Not on file    Physically abused: Not on file    Forced sexual activity: Not on file  Other Topics Concern  . Not on file  Social History Narrative  . Not on file   Family History  Problem Relation Age of Onset  . Lung cancer Mother   . Cancer Mother     OBJECTIVE:  Vitals:   08/24/19 1612  Pulse: (!) 105   Resp: 20  Temp: 98.1 F (36.7 C)  SpO2: 98%    General appearance: ALERT; appears uncomfortable, crying while walking back to exam room; tearful during examination, laying on RT side on exam table upon entering the room Head: NCAT ENT: Pupils fixed and constricted, EOMI grossly; nares patent with clear rhinorrhea; oropharynx clear Lungs: Normal respiratory effort; CTAB CV: RRR Musculoskeletal: Back Inspection: Skin warm, dry, clear and intact without obvious erythema, effusion, or ecchymosis.  Palpation: TTP over left lateral paravertebral muscles ROM: FROM active and passive Strength: 5/5 shoulder abduction/ adduction, 5/5 elbow flexion/ extension, 5/5 grip strength, 5/5 hip flexion, 5/5 knee abduction, 5/5 knee adduction, 5/5 knee flexion, 5/5 knee extension Skin: warm and dry Neurologic: Ambulates with minimal difficulty Psychological: alert and cooperative; tearful mood and affect  ASSESSMENT & PLAN:  1. Acute left-sided low back pain with left-sided sciatica     Meds ordered this encounter  Medications  . methylPREDNISolone sodium succinate (SOLU-MEDROL) 125 mg/2 mL injection 125 mg  . predniSONE (DELTASONE) 20 MG tablet    Sig: Take 1 tablet (20 mg total) by mouth 2 (two) times daily with a meal for 5 days.    Dispense:  10 tablet    Refill:  0    Order Specific Question:   Supervising Provider    Answer:   Raylene Everts [[id number]]  . cyclobenzaprine (FLEXERIL) 10 MG tablet    Sig: Take 1 tablet (10 mg total) by mouth at bedtime.    Dispense:  15 tablet    Refill:  0    Order Specific Question:   Supervising Provider    Answer:   Raylene Everts [[id number]]   Steroid shot given in office Continue conservative management of rest, ice, heat, and gentle stretches Prednisone prescribed.  Take as directed and to completion Take cyclobenzaprine at nighttime for symptomatic relief. Avoid driving or operating heavy machinery while using medication. Follow up with Dr.  Holly Bodily to establish care and for further evaluation and management as needed Go to the ER if you have any new or worsening symptoms (fever, chills, chest pain, abdominal pain, changes in bowel or bladder habits, worsening symptoms despite medications, etc...)   Reviewed expectations re: course of current medical issues. Questions answered. Outlined signs and symptoms indicating need for more acute intervention. Patient verbalized understanding. After Visit Summary given.    Lestine Box, PA-C 08/24/19 1629

## 2019-09-10 ENCOUNTER — Emergency Department (HOSPITAL_COMMUNITY)
Admission: EM | Admit: 2019-09-10 | Discharge: 2019-09-10 | Disposition: A | Discharge status: Home / Self Care | Payer: Medicaid Other | Attending: Emergency Medicine | Admitting: Emergency Medicine

## 2019-09-10 ENCOUNTER — Other Ambulatory Visit: Payer: Self-pay

## 2019-09-10 ENCOUNTER — Emergency Department (HOSPITAL_COMMUNITY): Payer: Medicaid Other

## 2019-09-10 ENCOUNTER — Encounter (HOSPITAL_COMMUNITY): Payer: Self-pay

## 2019-09-10 DIAGNOSIS — J45901 Unspecified asthma with (acute) exacerbation: Secondary | ICD-10-CM | POA: Insufficient documentation

## 2019-09-10 DIAGNOSIS — R4020 Unspecified coma: Secondary | ICD-10-CM | POA: Diagnosis not present

## 2019-09-10 DIAGNOSIS — R404 Transient alteration of awareness: Secondary | ICD-10-CM | POA: Diagnosis not present

## 2019-09-10 DIAGNOSIS — T50904A Poisoning by unspecified drugs, medicaments and biological substances, undetermined, initial encounter: Secondary | ICD-10-CM | POA: Diagnosis not present

## 2019-09-10 DIAGNOSIS — R0602 Shortness of breath: Secondary | ICD-10-CM | POA: Diagnosis present

## 2019-09-10 DIAGNOSIS — R000 Tachycardia, unspecified: Secondary | ICD-10-CM | POA: Diagnosis not present

## 2019-09-10 DIAGNOSIS — R0689 Other abnormalities of breathing: Secondary | ICD-10-CM | POA: Diagnosis not present

## 2019-09-10 DIAGNOSIS — Z79899 Other long term (current) drug therapy: Secondary | ICD-10-CM | POA: Insufficient documentation

## 2019-09-10 DIAGNOSIS — R092 Respiratory arrest: Secondary | ICD-10-CM | POA: Diagnosis not present

## 2019-09-10 DIAGNOSIS — T887XXA Unspecified adverse effect of drug or medicament, initial encounter: Secondary | ICD-10-CM | POA: Diagnosis not present

## 2019-09-10 LAB — CBC WITH DIFFERENTIAL/PLATELET
Abs Immature Granulocytes: 0.05 10*3/uL (ref 0.00–0.07)
Basophils Absolute: 0.1 10*3/uL (ref 0.0–0.1)
Basophils Relative: 0 %
Eosinophils Absolute: 0.2 10*3/uL (ref 0.0–0.5)
Eosinophils Relative: 2 %
HCT: 37.9 % (ref 36.0–46.0)
Hemoglobin: 11.9 g/dL — ABNORMAL LOW (ref 12.0–15.0)
Immature Granulocytes: 0 %
Lymphocytes Relative: 3 %
Lymphs Abs: 0.5 10*3/uL — ABNORMAL LOW (ref 0.7–4.0)
MCH: 29.6 pg (ref 26.0–34.0)
MCHC: 31.4 g/dL (ref 30.0–36.0)
MCV: 94.3 fL (ref 80.0–100.0)
Monocytes Absolute: 0.4 10*3/uL (ref 0.1–1.0)
Monocytes Relative: 3 %
Neutro Abs: 14.3 10*3/uL — ABNORMAL HIGH (ref 1.7–7.7)
Neutrophils Relative %: 92 %
Platelets: 471 10*3/uL — ABNORMAL HIGH (ref 150–400)
RBC: 4.02 MIL/uL (ref 3.87–5.11)
RDW: 13.5 % (ref 11.5–15.5)
WBC: 15.6 10*3/uL — ABNORMAL HIGH (ref 4.0–10.5)
nRBC: 0 % (ref 0.0–0.2)

## 2019-09-10 LAB — BASIC METABOLIC PANEL
Anion gap: 9 (ref 5–15)
BUN: 9 mg/dL (ref 6–20)
CO2: 29 mmol/L (ref 22–32)
Calcium: 8.5 mg/dL — ABNORMAL LOW (ref 8.9–10.3)
Chloride: 99 mmol/L (ref 98–111)
Creatinine, Ser: 0.56 mg/dL (ref 0.44–1.00)
GFR calc Af Amer: >60 mL/min (ref >60)
GFR calc non Af Amer: >60 mL/min (ref >60)
Glucose, Bld: 191 mg/dL — ABNORMAL HIGH (ref 70–99)
Potassium: 4 mmol/L (ref 3.5–5.1)
Sodium: 137 mmol/L (ref 135–145)

## 2019-09-10 LAB — CBG MONITORING, ED: Glucose-Capillary: 169 mg/dL — ABNORMAL HIGH (ref 70–99)

## 2019-09-10 MED ORDER — PREDNISONE 20 MG PO TABS: 20.0000 mg | ORAL_TABLET | Freq: Two times a day (BID) | ORAL | 0 refills | Status: DC

## 2019-09-10 MED ORDER — ALBUTEROL SULFATE HFA 108 (90 BASE) MCG/ACT IN AERS
8.0000 | INHALATION_SPRAY | Freq: Once | RESPIRATORY_TRACT | Status: AC
  Administered 2019-09-10: 8 via RESPIRATORY_TRACT
  Filled 2019-09-10: qty 6.7

## 2019-09-10 MED ORDER — ALBUTEROL SULFATE HFA 108 (90 BASE) MCG/ACT IN AERS: 1.0000 | INHALATION_SPRAY | Freq: Four times a day (QID) | RESPIRATORY_TRACT | 2 refills | Status: DC | PRN

## 2019-09-10 MED ORDER — ALBUTEROL SULFATE (2.5 MG/3ML) 0.083% IN NEBU: 2.5000 mg | INHALATION_SOLUTION | RESPIRATORY_TRACT | 2 refills | Status: DC | PRN

## 2019-09-10 MED ORDER — MAGNESIUM SULFATE 2 GM/50ML IV SOLN
2.0000 g | Freq: Once | INTRAVENOUS | Status: AC
  Administered 2019-09-10: 2 g via INTRAVENOUS
  Filled 2019-09-10: qty 50

## 2019-09-10 MED ORDER — DOXYCYCLINE HYCLATE 100 MG PO CAPS: 100.0000 mg | ORAL_CAPSULE | Freq: Two times a day (BID) | ORAL | 0 refills | Status: DC

## 2019-09-10 NOTE — ED Triage Notes (Addendum)
Pt brought in by EMS. Per EMS pt was resp arrest when they got on scene. They bagged her and gave narcan and responded after 4-5 mins. Then was wheezing and tight. Reports using crystal meth at 0740 then denied to staff when arrived that she used yesterday only. Took subutex this am. Given neb tx albuterol x 2 and 1 atrovent.and solumedrol  Pt reports sleeping prior to going into distress and had problems with breathing throughout night

## 2019-09-10 NOTE — Discharge Instructions (Addendum)
See the doctor of your choice if not better in 3 to 4 days.

## 2019-09-10 NOTE — ED Provider Notes (Signed)
[facility] EMERGENCY DEPARTMENT Provider Note   CSN: [id number] Arrival date & time: 09/10/19  0844     History   Chief Complaint Chief Complaint  Patient presents with   Respiratory Distress    HPI Melanie Mendoza is a 48 y.o. female.     HPI   She presents for evaluation of shortness of breath with cough occasionally productive of sputum.  She reports ongoing shortness of breath for several days.  She denies fever, chills, nausea or vomiting.  This morning she was having trouble breathing, and felt like she would pass out.  EMS arrived and found her with decreased responsiveness and treated with Narcan with improvement of her status.  I also treated her with nebulizers and Solu-Medrol, during transport.  Patient denies known sick contacts including COVID-19.  No other recent illnesses.  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  She does not smoke cigarettes.  She has history of narcotic addiction, and is on Suboxone.  There are no other known modifying factors.  Past Medical History:  Diagnosis Date   Acute pulmonary embolism (HCC) 09/13/2013   Asthma    Bilateral ovarian cysts    PE (pulmonary embolism) 09/2013   Polysubstance abuse (HCC)    Ulcerative colitis (HCC)     Patient Active Problem List   Diagnosis Date Noted   Screening for colorectal cancer 05/19/2019   Screening examination for STD (sexually transmitted disease) 05/19/2019   Well woman exam with routine gynecological exam 05/19/2019   Wheezing 05/19/2019   Mass of upper outer quadrant of left breast 05/19/2019   Peri-menopause 05/19/2019   Acute appendicitis 10/22/2016   Asthma exacerbation 07/03/2016   Hyperlipidemia 07/01/2016   Prediabetes 07/01/2016   Long term (current) use of anticoagulants [Z79.01] 05/08/2016   On anticoagulant therapy 04/16/2016   Obesity, unspecified 04/16/2016   Manipulative behavior 09/13/2013   Acute pulmonary embolism (HCC) 09/13/2013   Asthma  with bronchitis 09/08/2013   Polysubstance abuse (HCC) 09/08/2013   Narcotic abuse (HCC) 09/08/2013   Hypoxia 09/08/2013   DVT of leg (deep venous thrombosis) (HCC) 09/08/2013   Anemia 09/08/2013   Overdose of benzodiazepine 08/20/2013   Acute encephalopathy 08/20/2013   Acute respiratory failure (HCC) 08/20/2013   Asthma 08/20/2013   Leukocytosis 08/20/2013    Past Surgical History:  Procedure Laterality Date   LAPAROSCOPIC APPENDECTOMY N/A 10/23/2016   Procedure: APPENDECTOMY LAPAROSCOPIC;  Surgeon: Franky Macho, MD;  Location: [facility];  Service: General;  Laterality: N/A;     OB History    Gravida  2   Para  2   Term      Preterm      AB      Living  2     SAB      TAB      Ectopic      Multiple      Live Births               Home Medications    Prior to Admission medications   Medication Sig Start Date End Date Taking? Authorizing Provider  acetaminophen (TYLENOL) 500 MG tablet Take 1,000 mg by mouth every 6 (six) hours as needed for moderate pain.    [provider]  albuterol (PROVENTIL) (2.5 MG/3ML) 0.083% nebulizer solution Take 3 mLs (2.5 mg total) by nebulization every 6 (six) hours as needed for wheezing or shortness of breath. 09/10/19   Mancel Bale, MD  albuterol (PROVENTIL) (2.5 MG/3ML) 0.083% nebulizer solution Take  3 mLs (2.5 mg total) by nebulization every 4 (four) hours as needed for wheezing or shortness of breath. 09/10/19   Mancel Bale, MD  albuterol (VENTOLIN HFA) 108 (90 Base) MCG/ACT inhaler Inhale 1-2 puffs into the lungs every 6 (six) hours as needed for wheezing or shortness of breath. 09/10/19   Mancel Bale, MD  buprenorphine-naloxone (SUBOXONE) 8-2 mg SUBL SL tablet Place 1 tablet under the tongue daily.    [provider]  cyclobenzaprine (FLEXERIL) 10 MG tablet Take 1 tablet (10 mg total) by mouth at bedtime. 08/24/19   Wurst, [place], PA-C  doxycycline (VIBRAMYCIN) 100 MG capsule Take 1  capsule (100 mg total) by mouth 2 (two) times daily. 09/10/19   Mancel Bale, MD  fluticasone (FLOVENT HFA) 110 MCG/ACT inhaler Inhale 2 puffs into the lungs daily. 07/04/16   Philip Aspen, Limmie Patricia, MD  mometasone-formoterol (DULERA) 100-5 MCG/ACT AERO Inhale 2 puffs into the lungs 2 (two) times daily. 09/10/16   Jacquelin Hawking, PA-C  predniSONE (DELTASONE) 20 MG tablet Take 1 tablet (20 mg total) by mouth 2 (two) times daily. 09/10/19   Mancel Bale, MD  QUEtiapine (SEROQUEL) 300 MG tablet Take 300 mg by mouth at bedtime.    [provider]  sulfaSALAzine (AZULFIDINE) 500 MG tablet Take 500 mg by mouth 2 (two) times daily.     [provider]    Family History Family History  Problem Relation Age of Onset   Lung cancer Mother    Cancer Mother     Social History Social History   Tobacco Use   Smoking status: Never Smoker   Smokeless tobacco: Never Used  Substance Use Topics   Alcohol use: No   Drug use: Yes    Types: Benzodiazepines, Oxycodone    Comment: crystal meth     Allergies   Patient has no known allergies.   Review of Systems Review of Systems  All other systems reviewed and are negative.    Physical Exam Updated Vital Signs BP 113/66 (BP Location: Right Arm)    Pulse 94    Temp 97.9 F (36.6 C) (Oral)    Resp 18    Ht  (1.702 m)    Wt 81.6 kg    SpO2 94%    BMI 28.19 kg/m   Physical Exam Vitals signs and nursing note reviewed.  Constitutional:      Appearance: She is well-developed.  HENT:     Head: Normocephalic and atraumatic.     Right Ear: External ear normal.     Left Ear: External ear normal.     Nose: No congestion or rhinorrhea.     Mouth/Throat:     Mouth: Mucous membranes are moist.  Eyes:     Conjunctiva/sclera: Conjunctivae normal.     Pupils: Pupils are equal, round, and reactive to light.  Neck:     Musculoskeletal: Normal range of motion and neck supple.     Trachea: Phonation normal.    Cardiovascular:     Rate and Rhythm: Regular rhythm. Tachycardia present.  Pulmonary:     Effort: Pulmonary effort is normal.     Comments: Increased respiratory rate, audible wheezes. Abdominal:     General: There is no distension.     Palpations: Abdomen is soft.     Tenderness: There is no abdominal tenderness.  Musculoskeletal: Normal range of motion.  Skin:    General: Skin is warm and dry.  Neurological:     Mental Status: She is  alert and oriented to person, place, and time.     Cranial Nerves: No cranial nerve deficit.     Sensory: No sensory deficit.     Motor: No abnormal muscle tone.     Coordination: Coordination normal.  Psychiatric:        Mood and Affect: Mood normal.        Behavior: Behavior normal.        Thought Content: Thought content normal.        Judgment: Judgment normal.      ED Treatments / Results  Labs (all labs ordered are listed, but only abnormal results are displayed) Labs Reviewed  BASIC METABOLIC PANEL - Abnormal; Notable for the following components:      Result Value   Glucose, Bld 191 (*)    Calcium 8.5 (*)    All other components within normal limits  CBC WITH DIFFERENTIAL/PLATELET - Abnormal; Notable for the following components:   WBC 15.6 (*)    Hemoglobin 11.9 (*)    Platelets 471 (*)    Neutro Abs 14.3 (*)    Lymphs Abs 0.5 (*)    All other components within normal limits  CBG MONITORING, ED - Abnormal; Notable for the following components:   Glucose-Capillary 169 (*)    All other components within normal limits    EKG EKG Interpretation  Date/Time:  Friday September 10 2019 09:27:24 EST Ventricular Rate:  108 PR Interval:    QRS Duration: 85 QT Interval:  341 QTC Calculation: 457 R Axis:   77 Text Interpretation: Sinus tachycardia Probable left atrial enlargement Since last tracing rate faster Confirmed by Daleen Bo [id number]) on 09/10/2019 10:21:18 AM   Radiology Dg Chest Port 1 View  Result Date:  09/10/2019 CLINICAL DATA:  Respiratory arrest, crystal meth use, responded after Narcan, problems with breathing throughout the night EXAM: PORTABLE CHEST 1 VIEW COMPARISON:  Portable exam 0932 hours compared to 09/10/2018 FINDINGS: Normal heart size, mediastinal contours, and pulmonary vascularity. Lungs hyperinflated but clear. No pulmonary infiltrate, pleural effusion or pneumothorax. Osseous structures unremarkable. IMPRESSION: Mild hyperinflation; otherwise negative exam. Electronically Signed   By: Lavonia Dana M.D.   On: 09/10/2019 09:43    Procedures .Critical Care Performed by: Daleen Bo, MD Authorized by: Daleen Bo, MD   Critical care provider statement:    Critical care time (minutes):  35   Critical care start time:  09/10/2019 9:00 AM   Critical care end time:  09/10/2019 12:40 PM   Critical care time was exclusive of:  Separately billable procedures and treating other patients   Critical care was necessary to treat or prevent imminent or life-threatening deterioration of the following conditions:  Respiratory failure   Critical care was time spent personally by me on the following activities:  Blood draw for specimens, development of treatment plan with patient or surrogate, discussions with consultants, evaluation of patient's response to treatment, examination of patient, obtaining history from patient or surrogate, ordering and performing treatments and interventions, ordering and review of laboratory studies, pulse oximetry, re-evaluation of patient's condition, review of old charts and ordering and review of radiographic studies   (including critical care time)  Medications Ordered in ED Medications  magnesium sulfate IVPB 2 g 50 mL (0 g Intravenous Stopped 09/10/19 1126)  albuterol (VENTOLIN HFA) 108 (90 Base) MCG/ACT inhaler 8 puff (8 puffs Inhalation Given 09/10/19 1026)     Initial Impression / Assessment and Plan / ED Course  I have reviewed the triage  vital signs and the nursing notes.  Pertinent labs & imaging results that were available during my care of the patient were reviewed by me and considered in my medical decision making (see chart for details).  Clinical Course as of Sep 09 1236  Fri Sep 10, 2019  1102 Normal except glucose high, calcium low  Basic metabolic panel(!) [EW]  1102 Normal except white count high, platelets high  CBC with Differential(!) [EW]  1103 No infiltrate or CHF, image interpreted by me  DG Chest [place] 1 View [EW]    Clinical Course User Index [EW] Mancel Bale, MD        Patient Vitals for the past 24 hrs:  BP Temp Temp src Pulse Resp SpO2 Height Weight  09/10/19 1110 113/66 -- -- 94 18 94 % -- --  09/10/19 1100 -- -- -- (!) 101 13 94 % -- --  09/10/19 1030 -- -- -- (!) 107 14 95 % -- --  09/10/19 1000 -- -- -- (!) 104 19 97 % -- --  09/10/19 0900 132/88 -- -- (!) 113 17 95 % -- --  09/10/19 0856 (!) 146/100 97.9 F (36.6 C) Oral (!) 111 17 96 % -- --  09/10/19 0854 -- -- -- -- -- -- 5\' 7"  (1.702 m) 81.6 kg  09/10/19 0853 -- -- -- -- -- 96 % -- --    12:26 PM Reevaluation with update and discussion. After initial assessment and treatment, an updated evaluation reveals oxygen saturation 90% on room air, she is not in respiratory distress.  At this time she has mild wheezing.  She will be discharged with inhaler and requests refills on her medications.  Findings discussed with patient and her husband, all questions answered. 09/12/19   Medical Decision Making: Ongoing shortness of breath with cough and history of COPD.  She possibly had respiratory arrest this morning associated with taking Subutex.  Treated in the field by EMS with Narcan.  No recurrence of lethargy or altered mental status in the ED.  Melanie Mendoza was evaluated in Emergency Department on 09/11/2019 for the symptoms described in the history of present illness. She was evaluated in the context of the global COVID-19  pandemic, which necessitated consideration that the patient might be at risk for infection with the SARS-CoV-2 virus that causes COVID-19. Institutional protocols and algorithms that pertain to the evaluation of patients at risk for COVID-19 are in a state of rapid change based on information released by regulatory bodies including the CDC and federal and state organizations. These policies and algorithms were followed during the patient's care in the ED.  CRITICAL CARE- yes Performed by: 09/13/2019  Nursing Notes Reviewed/ Care Coordinated Applicable Imaging Reviewed Interpretation of Laboratory Data incorporated into ED treatment  The patient appears reasonably screened and/or stabilized for discharge and I doubt any other medical condition or other [facility] requiring further screening, evaluation, or treatment in the ED at this time prior to discharge.  Plan: Home Medications-continue usual; Home Treatments-rest, fluids; return here if the recommended treatment, does not improve the symptoms; Recommended follow up-PCP checkup 1 week and as needed.   Final Clinical Impressions(s) / ED Diagnoses   Final diagnoses:  Moderate asthma with exacerbation, unspecified whether persistent    ED Discharge Orders         Ordered    albuterol (VENTOLIN HFA) 108 (90 Base) MCG/ACT inhaler  Every 6 hours PRN     09/10/19 1231    doxycycline (  VIBRAMYCIN) 100 MG capsule  2 times daily     09/10/19 1231    predniSONE (DELTASONE) 20 MG tablet  2 times daily,   Status:  Discontinued     09/10/19 1231    albuterol (PROVENTIL) (2.5 MG/3ML) 0.083% nebulizer solution  Every 4 hours PRN     09/10/19 1235    predniSONE (DELTASONE) 20 MG tablet  2 times daily     09/10/19 1235           Mancel BaleWentz, Marisal Swarey, MD 09/11/19 [phone]

## 2019-10-11 ENCOUNTER — Ambulatory Visit: Payer: Medicaid Other | Admitting: Family Medicine

## 2020-01-19 ENCOUNTER — Encounter: Payer: Self-pay | Admitting: Emergency Medicine

## 2020-01-19 ENCOUNTER — Other Ambulatory Visit: Payer: Self-pay

## 2020-01-19 ENCOUNTER — Ambulatory Visit
Admission: EM | Admit: 2020-01-19 | Discharge: 2020-01-19 | Disposition: A | Discharge status: Home / Self Care | Payer: Medicaid Other | Attending: Emergency Medicine | Admitting: Emergency Medicine

## 2020-01-19 DIAGNOSIS — J4541 Moderate persistent asthma with (acute) exacerbation: Secondary | ICD-10-CM

## 2020-01-19 DIAGNOSIS — R0602 Shortness of breath: Secondary | ICD-10-CM

## 2020-01-19 DIAGNOSIS — R062 Wheezing: Secondary | ICD-10-CM

## 2020-01-19 MED ORDER — ALBUTEROL SULFATE HFA 108 (90 BASE) MCG/ACT IN AERS
2.0000 | INHALATION_SPRAY | Freq: Once | RESPIRATORY_TRACT | Status: AC
  Administered 2020-01-19: 2 via RESPIRATORY_TRACT

## 2020-01-19 MED ORDER — PREDNISONE 10 MG (21) PO TBPK: ORAL_TABLET | Freq: Every day | ORAL | 0 refills | Status: DC

## 2020-01-19 MED ORDER — ALBUTEROL SULFATE (2.5 MG/3ML) 0.083% IN NEBU: 2.5000 mg | INHALATION_SOLUTION | Freq: Four times a day (QID) | RESPIRATORY_TRACT | 1 refills | Status: AC | PRN

## 2020-01-19 MED ORDER — ALBUTEROL SULFATE HFA 108 (90 BASE) MCG/ACT IN AERS: 1.0000 | INHALATION_SPRAY | Freq: Four times a day (QID) | RESPIRATORY_TRACT | 2 refills | Status: AC | PRN

## 2020-01-19 MED ORDER — DEXAMETHASONE SODIUM PHOSPHATE 10 MG/ML IJ SOLN
10.0000 mg | Freq: Once | INTRAMUSCULAR | Status: AC
  Administered 2020-01-19: 19:00:00 10 mg via INTRAMUSCULAR

## 2020-01-19 NOTE — Discharge Instructions (Signed)
Declines further evaluation and management in the ED at this time.    Steroid shot given in office Proair inhaler prescribed.  Use as directed Take steroid as prescribed and to completion Albuterol solution prescribed.  Use as directed Return here or go to ER if you have any new or worsening symptoms such as shortness of breath, difficulty breathing, accessory muscle use, rib retraction, or if symptoms do not improve with medication

## 2020-01-19 NOTE — ED Provider Notes (Signed)
[place]   [id number] 01/19/20 Arrival Time: 1853  Cc: Asthma  SUBJECTIVE:  Melanie Mendoza is a 49 y.o. female who presents with SOB, wheezing, and difficulty breathing x 1 day.  Denies positive sick exposure or precipitating event.   Has tried breathing treatment with temporary relief.  Symptoms are made worse with talking.  Reports previous symptoms in the past with asthma flare improved with steroids and albuterol inhaler.  Denies fever, chills, fatigue, sinus pain, rhinorrhea, sore throat, chest pain, nausea, changes in bowel or bladder habits.    ROS: As per HPI.  All other pertinent ROS negative.     Past Medical History:  Diagnosis Date  . Acute pulmonary embolism ([place]) 09/13/2013  . Asthma   . Bilateral ovarian cysts   . PE (pulmonary embolism) 09/2013  . Polysubstance abuse ([address])   . Ulcerative colitis [facility])    Past Surgical History:  Procedure Laterality Date  . LAPAROSCOPIC APPENDECTOMY N/A 10/23/2016   Procedure: APPENDECTOMY LAPAROSCOPIC;  Surgeon: Aviva Signs, MD;  Location: [facility];  Service: General;  Laterality: N/A;   No Known Allergies No current facility-administered medications on file prior to encounter.   Current Outpatient Medications on File Prior to Encounter  Medication Sig Dispense Refill  . buprenorphine-naloxone (SUBOXONE) 8-2 mg SUBL SL tablet Place 1 tablet under the tongue daily.    Marland Kitchen acetaminophen (TYLENOL) 500 MG tablet Take 1,000 mg by mouth every 6 (six) hours as needed for moderate pain.    Marland Kitchen sulfaSALAzine (AZULFIDINE) 500 MG tablet Take 500 mg by mouth 2 (two) times daily.     . [DISCONTINUED] fluticasone (FLOVENT HFA) 110 MCG/ACT inhaler Inhale 2 puffs into the lungs daily. 1 Inhaler 12  . [DISCONTINUED] mometasone-formoterol (DULERA) 100-5 MCG/ACT AERO Inhale 2 puffs into the lungs 2 (two) times daily. 3 Inhaler 2    Social History   Socioeconomic History  . Marital status: Single    Spouse name: Not on file  . Number of  children: Not on file  . Years of education: Not on file  . Highest education level: Not on file  Occupational History  . Not on file  Tobacco Use  . Smoking status: Never Smoker  . Smokeless tobacco: Never Used  Substance and Sexual Activity  . Alcohol use: No  . Drug use: Yes    Types: Benzodiazepines, Oxycodone    Comment: crystal meth  . Sexual activity: Yes    Birth control/protection: None  Other Topics Concern  . Not on file  Social History Narrative  . Not on file   Social Determinants of Health   Financial Resource Strain:   . Difficulty of Paying Living Expenses:   Food Insecurity:   . Worried About Charity fundraiser in the Last Year:   . Arboriculturist in the Last Year:   Transportation Needs:   . Film/video editor (Medical):   Marland Kitchen Lack of Transportation (Non-Medical):   Physical Activity:   . Days of Exercise per Week:   . Minutes of Exercise per Session:   Stress:   . Feeling of Stress :   Social Connections:   . Frequency of Communication with Friends and Family:   . Frequency of Social Gatherings with Friends and Family:   . Attends Religious Services:   . Active Member of Clubs or Organizations:   . Attends Archivist Meetings:   Marland Kitchen Marital Status:   Intimate Partner Violence:   . Fear of  Current or Ex-Partner:   . Emotionally Abused:   Marland Kitchen Physically Abused:   . Sexually Abused:    Family History  Problem Relation Age of Onset  . Lung cancer Mother   . Cancer Mother      OBJECTIVE:  Vitals:   01/19/20 1904  BP: 134/82  Pulse: (!) 107  Resp: (!) 24  Temp: 97.9 F (36.6 C)  TempSrc: Oral  SpO2: 90%     General appearance: Alert, appears fatigued, but nontoxic; respirations labored, speaking in fractured sentences HEENT:NCAT; Ears: EACs clear, Eyes: PERRL.  EOM grossly intact. Neck: supple without LAD Lungs: wheezes throughout bilateral lung fields to anterior and posterior chest Heart: regular rate and rhythm.     Skin: warm and dry Psychological: alert and cooperative; normal mood and affect  ASSESSMENT & PLAN:  1. Moderate persistent asthma with exacerbation   2. SOB (shortness of breath)   3. Wheezing     Meds ordered this encounter  Medications  . dexamethasone (DECADRON) injection 10 mg  . albuterol (VENTOLIN HFA) 108 (90 Base) MCG/ACT inhaler 2 puff  . predniSONE (STERAPRED UNI-PAK 21 TAB) 10 MG (21) TBPK tablet    Sig: Take by mouth daily. Take 6 tabs by mouth daily  for 2 days, then 5 tabs for 2 days, then 4 tabs for 2 days, then 3 tabs for 2 days, 2 tabs for 2 days, then 1 tab by mouth daily for 2 days    Dispense:  42 tablet    Refill:  0    Order Specific Question:   Supervising Provider    Answer:   Eustace Moore [[id number]]  . albuterol (VENTOLIN HFA) 108 (90 Base) MCG/ACT inhaler    Sig: Inhale 1-2 puffs into the lungs every 6 (six) hours as needed for wheezing or shortness of breath.    Dispense:  18 g    Refill:  2    Order Specific Question:   Supervising Provider    Answer:   Eustace Moore [[id number]]  . albuterol (PROVENTIL) (2.5 MG/3ML) 0.083% nebulizer solution    Sig: Take 3 mLs (2.5 mg total) by nebulization every 6 (six) hours as needed for wheezing or shortness of breath.    Dispense:  75 mL    Refill:  1    Order Specific Question:   Supervising Provider    Answer:   Eustace Moore [[id number]]   Declines further evaluation and management in the ED at this time.    Steroid shot given in office Proair inhaler prescribed.  Use as directed Take steroid as prescribed and to completion Albuterol solution prescribed.  Use as directed Return here or go to ER if you have any new or worsening symptoms such as shortness of breath, difficulty breathing, accessory muscle use, rib retraction, or if symptoms do not improve with medication   Reviewed expectations re: course of current medical issues. Questions answered. Outlined signs and symptoms indicating need  for more acute intervention. Patient verbalized understanding. After Visit Summary given.          Rennis Harding, PA-C 01/19/20 1936

## 2020-01-19 NOTE — ED Triage Notes (Signed)
seen by provider  Audible wheezing, speaking in complete sentences

## 2020-03-20 ENCOUNTER — Telehealth: Payer: Self-pay | Admitting: Adult Health

## 2020-03-20 NOTE — Telephone Encounter (Signed)
Was unable to reach patient or voicemail to notify pt of upcoming appointment on 03/21/20 will screen patient upon check in for appointment.

## 2020-03-21 ENCOUNTER — Ambulatory Visit: Payer: Medicaid Other | Admitting: Adult Health

## 2020-05-25 ENCOUNTER — Encounter: Payer: Self-pay | Admitting: Adult Health

## 2020-05-25 ENCOUNTER — Ambulatory Visit (INDEPENDENT_AMBULATORY_CARE_PROVIDER_SITE_OTHER): Payer: Medicaid Other | Admitting: Adult Health

## 2020-05-25 VITALS — BP 136/83 | HR 85 | Ht 67.0 in | Wt 178.5 lb

## 2020-05-25 DIAGNOSIS — R4586 Emotional lability: Secondary | ICD-10-CM | POA: Insufficient documentation

## 2020-05-25 DIAGNOSIS — Z1211 Encounter for screening for malignant neoplasm of colon: Secondary | ICD-10-CM

## 2020-05-25 DIAGNOSIS — N6321 Unspecified lump in the left breast, upper outer quadrant: Secondary | ICD-10-CM | POA: Diagnosis not present

## 2020-05-25 DIAGNOSIS — R102 Pelvic and perineal pain: Secondary | ICD-10-CM | POA: Insufficient documentation

## 2020-05-25 DIAGNOSIS — Z1212 Encounter for screening for malignant neoplasm of rectum: Secondary | ICD-10-CM

## 2020-05-25 DIAGNOSIS — R232 Flushing: Secondary | ICD-10-CM | POA: Diagnosis not present

## 2020-05-25 NOTE — Progress Notes (Signed)
  Subjective:     Patient ID: Melanie Mendoza, female   DOB: 03-Jul-1971, 49 y.o.   MRN: [id number]  HPI Emmalie is a 49 year old white female, PM in complaining of pelvic pressure. No period in over a year, having hot flashes and moody. She still has left breast mass, did not get mammogram last year as ordered.   Review of Systems +pelvic pressure +hot flashes and moody No period in a year Left breast mass still there Denies pain with sex or any trouble with peeing or BMs Reviewed past medical,surgical, social and family history. Reviewed medications and allergies.       Objective:   Physical Exam BP 136/83 (BP Location: Left Arm, Patient Position: Sitting, Cuff Size: Normal)   Pulse 85   Ht 5\' 7"  (1.702 m)   Wt 178 lb 8 oz (81 kg)   LMP 04/28/2019   BMI 27.96 kg/m  Skin warm and dry,  Breasts:no dominate palpable mass, retraction or nipple discharge, on the right, on the left breast still has mass at about 2 0' clock, tender and oval, no retraction or nipple discharge Pelvic: external genitalia is normal in appearance no lesions, vagina: pink, fair moisture, urethra has no lesions or masses noted, cervix:smooth, uterus: normal size, shape and contour, non tender, no masses felt, adnexa: no masses, some RLQ tenderness noted. Bladder is non tender and no masses felt.    Examination chaperoned by 04/30/2019 LPN.  Assessment:     1. Mass of upper outer quadrant of left breast Get diagnostic bilateral mammogram and right and left Faith Rogue at [facility] 06/13/20 a 2:40 pm  2. Pelvic pressure in female Return in 1 week for GYN 06/15/20 to assess   3. Screening for colorectal cancer Referred to Dr [place] for colonoscopy   4. Hot flashes  5. Mood swings     Plan:     Will talk when results back May try SSRI for hot flashes and mood swings, not HRT candidate had PE and DVT

## 2020-05-30 ENCOUNTER — Encounter (INDEPENDENT_AMBULATORY_CARE_PROVIDER_SITE_OTHER): Payer: Self-pay | Admitting: Gastroenterology

## 2020-06-02 DIAGNOSIS — Z23 Encounter for immunization: Secondary | ICD-10-CM | POA: Diagnosis not present

## 2020-06-07 ENCOUNTER — Other Ambulatory Visit: Payer: Medicaid Other

## 2020-06-07 ENCOUNTER — Ambulatory Visit (HOSPITAL_COMMUNITY): Admission: RE | Admit: 2020-06-07 | Payer: Medicaid Other | Source: Ambulatory Visit

## 2020-06-07 ENCOUNTER — Ambulatory Visit (HOSPITAL_COMMUNITY): Payer: Medicaid Other

## 2020-06-07 ENCOUNTER — Encounter (HOSPITAL_COMMUNITY): Payer: Medicaid Other

## 2020-06-13 ENCOUNTER — Other Ambulatory Visit (HOSPITAL_COMMUNITY): Payer: Medicaid Other

## 2020-06-13 ENCOUNTER — Encounter (HOSPITAL_COMMUNITY): Payer: Medicaid Other

## 2020-06-29 ENCOUNTER — Ambulatory Visit (INDEPENDENT_AMBULATORY_CARE_PROVIDER_SITE_OTHER): Payer: Medicaid Other | Admitting: Gastroenterology

## 2020-07-27 ENCOUNTER — Encounter (INDEPENDENT_AMBULATORY_CARE_PROVIDER_SITE_OTHER): Payer: Self-pay | Admitting: Gastroenterology

## 2020-07-27 ENCOUNTER — Telehealth (INDEPENDENT_AMBULATORY_CARE_PROVIDER_SITE_OTHER): Payer: Self-pay | Admitting: *Deleted

## 2020-07-27 ENCOUNTER — Other Ambulatory Visit: Payer: Self-pay

## 2020-07-27 ENCOUNTER — Encounter (INDEPENDENT_AMBULATORY_CARE_PROVIDER_SITE_OTHER): Payer: Self-pay | Admitting: *Deleted

## 2020-07-27 ENCOUNTER — Ambulatory Visit (INDEPENDENT_AMBULATORY_CARE_PROVIDER_SITE_OTHER): Payer: Medicaid Other | Admitting: Gastroenterology

## 2020-07-27 ENCOUNTER — Other Ambulatory Visit (INDEPENDENT_AMBULATORY_CARE_PROVIDER_SITE_OTHER): Payer: Self-pay | Admitting: *Deleted

## 2020-07-27 DIAGNOSIS — K5190 Ulcerative colitis, unspecified, without complications: Secondary | ICD-10-CM

## 2020-07-27 MED ORDER — SUPREP BOWEL PREP KIT 17.5-3.13-1.6 GM/177ML PO SOLN: 1.0000 | Freq: Once | ORAL | 0 refills | Status: AC

## 2020-07-27 NOTE — Progress Notes (Signed)
Melanie Mendoza, M.D. Gastroenterology & Hepatology [facility] [address] [place], [place] [address] Primary Care Physician: Patient, No Pcp Per No address on file  Referring MD: Adline Potter, NP  I will communicate my assessment and recommendations to the referring MD via EMR. Note: Occasional unusual wording and randomly placed punctuation marks may result from the use of speech recognition technology to transcribe this document"  Chief Complaint: Ulcerative colitis  History of Present Illness: Melanie Mendoza is a 49 y.o. female with PMH UC, history of PE of unknown etiology off AC (possible thrombophilia), who presents for evaluation of ulcerative colitis  Patient was diagnosed with ulcerative colitis close to 22 years ago after presenting rectal bleeding episodes and abdominal pain.  States that she has not been taking medication frequently.  She states she took prednisone on and off through the years (believes has taken prednisone 6-7 times in her lifetime). Her most recent flare was while she was at [facility] while incarcerated, in 2017.  At that time she reports that she underwent a colonoscopy at [facility] - none of these results are available.  She reported that she has severe inflammation.  She was put on sulfazalasine in 2017, but she has been taking it on and off since then.  She does not know how extensive her colitis has been.   She states that for the last couple of months she feels some lower abdominal pressure along with lower abdominal pain, worse in the left side of the abdomen.  She has a BM every day, but recently she has been having a BM every other day. Stool has been normal in consistency.  The patient denies having any nausea, vomiting, fever, chills, hematochezia, melena, hematemesis, abdominal distention, abdominal pain, diarrhea, jaundice, pruritus or weight loss.  Denies any EIM.  Last flu shot:last year Last  pneumonia shot: Never Last DEXA scan: never COVID-19 shot: has received one shot, will receive next in one week  Last DXA:JOINO Last Colonoscopy: 2017 - inflammation  FHx: cousin had Crohn's disease, other far family members had CD, neg for any gastrointestinal/liver disease, lung cancer mother Social: neg smoking, quit alcohol in past, used to do multiple drugs including IV (heroin and cocaine), now on suboxone last OD was 08/2019 Surgical: cholecystectomy 2017  Past Medical History: Past Medical History:  Diagnosis Date  . Acute pulmonary embolism (HCC) 09/13/2013  . Asthma   . Bilateral ovarian cysts   . PE (pulmonary embolism) 09/2013  . Polysubstance abuse (HCC)   . Ulcerative colitis [facility])     Past Surgical History: Past Surgical History:  Procedure Laterality Date  . LAPAROSCOPIC APPENDECTOMY N/A 10/23/2016   Procedure: APPENDECTOMY LAPAROSCOPIC;  Surgeon: Franky Macho, MD;  Location: [facility];  Service: General;  Laterality: N/A;    Family History: Family History  Problem Relation Age of Onset  . Lung cancer Mother   . Cancer Mother     Social History: Social History   Tobacco Use  Smoking Status Never Smoker  Smokeless Tobacco Never Used   Social History   Substance and Sexual Activity  Alcohol Use No   Social History   Substance and Sexual Activity  Drug Use Yes  . Types: Benzodiazepines, Oxycodone   Comment: on suboxone    Allergies: No Known Allergies  Medications: Current Outpatient Medications  Medication Sig Dispense Refill  . acetaminophen (TYLENOL) 500 MG tablet Take 1,000 mg by mouth every 6 (six) hours as needed for  moderate pain.    Marland Kitchen albuterol (PROVENTIL) (2.5 MG/3ML) 0.083% nebulizer solution Take 3 mLs (2.5 mg total) by nebulization every 6 (six) hours as needed for wheezing or shortness of breath. 75 mL 1  . albuterol (VENTOLIN HFA) 108 (90 Base) MCG/ACT inhaler Inhale 1-2 puffs into the lungs every 6 (six) hours as needed for  wheezing or shortness of breath. 18 g 2  . buprenorphine-naloxone (SUBOXONE) 8-2 mg SUBL SL tablet Place 1 tablet under the tongue daily.    . cloNIDine (CATAPRES) 0.1 MG tablet Take 0.1 mg by mouth at bedtime.    Marland Kitchen QUEtiapine (SEROQUEL) 100 MG tablet Take 100 mg by mouth at bedtime.     No current facility-administered medications for this visit.    Review of Systems: GENERAL: negative for malaise, night sweats HEENT: No changes in hearing or vision, no nose bleeds or other nasal problems. NECK: Negative for lumps, goiter, pain and significant neck swelling RESPIRATORY: Negative for cough, wheezing CARDIOVASCULAR: Negative for chest pain, leg swelling, palpitations, orthopnea GI: SEE HPI MUSCULOSKELETAL: Negative for joint pain or swelling, back pain, and muscle pain. SKIN: Negative for lesions, rash PSYCH: Negative for sleep disturbance, mood disorder and recent psychosocial stressors. HEMATOLOGY Negative for prolonged bleeding, bruising easily, and swollen nodes. ENDOCRINE: Negative for cold or heat intolerance, polyuria, polydipsia and goiter. NEURO: negative for tremor, gait imbalance, syncope and seizures. The remainder of the review of systems is noncontributory.   Physical Exam: BP (!) 144/89 (BP Location: Right Arm, Patient Position: Sitting, Cuff Size: Normal)   Pulse 87   Temp 99 F (37.2 C) (Oral)   Ht 5\' 7"  (1.702 m)   Wt 183 lb 9.6 oz (83.3 kg)   LMP 04/28/2019   BMI 28.76 kg/m  GENERAL: The patient is AO x3, in no acute distress. HEENT: Head is normocephalic and atraumatic. EOMI are intact. Mouth is well hydrated and without lesions. NECK: Supple. No masses LUNGS: Clear to auscultation. No presence of rhonchi/wheezing/rales. Adequate chest expansion HEART: RRR, normal s1 and s2. ABDOMEN: Mildly distended in lower abdomen and mildly tender in the hypogastric area, no guarding, no peritoneal signs. BS +. No masses. EXTREMITIES: Without any cyanosis, clubbing,  rash, lesions or edema. NEUROLOGIC: AOx3, no focal motor deficit. SKIN: no jaundice, no rashes   Imaging/Labs: as above  I personally reviewed and interpreted the available labs, imaging and endoscopic files.  Impression and Plan: Melanie Mendoza is a 49 y.o. female with PMH UC, history of PE of unknown etiology off AC (possible thrombophilia), who presents for evaluation of ulcerative colitis.  The patient has not been on any medication to control her disease consistently.  It is unclear how active her disease is at this moment, as this could cause some of the abdominal pain and bloating she is presenting.  It is also unclear how extensive her disease has been in the past.  We will obtain baseline labs at this moment and we will schedule for a colonoscopy.  As she has been taking prednisone intermittently several times, a DEXA scan will be ordered to evaluate for osteoporosis.  Patient was also encouraged to finish her Covid vaccination and to obtain the flu and pneumonia shot.  The patient understood and agreed  - Schedule colonoscopy, may need biopsies every 10 cm depending on extension of inflammation - Schedule DEXA scan - Check CBC, CMP, B12, Vitamin D, CRP - Patient to ask PCP about pneumonia and flu shot - Complete COVID shot series - RTC  6 months  All questions were answered.      Melanie Blazing, MD Gastroenterology and Hepatology [facility]

## 2020-07-27 NOTE — Patient Instructions (Signed)
Schedule colonoscopy Schedule DEXA scan Perform blood workup Ask PCP about pneumonia and flu shot Complete COVID shot series

## 2020-07-27 NOTE — Telephone Encounter (Signed)
Patient needs suprep 

## 2020-07-27 NOTE — H&P (View-Only) (Signed)
Katrinka Blazing, M.D. Gastroenterology & Hepatology [facility] [address] [place], [place] [address] Primary Care Physician: Patient, No Pcp Per No address on file  Referring MD: Adline Potter, NP  I will communicate my assessment and recommendations to the referring MD via EMR. Note: Occasional unusual wording and randomly placed punctuation marks may result from the use of speech recognition technology to transcribe this document"  Chief Complaint: Ulcerative colitis  History of Present Illness: Melanie Mendoza is a 49 y.o. female with PMH UC, history of PE of unknown etiology off AC (possible thrombophilia), who presents for evaluation of ulcerative colitis  Patient was diagnosed with ulcerative colitis close to 22 years ago after presenting rectal bleeding episodes and abdominal pain.  States that she has not been taking medication frequently.  She states she took prednisone on and off through the years (believes has taken prednisone 6-7 times in her lifetime). Her most recent flare was while she was at [facility] while incarcerated, in 2017.  At that time she reports that she underwent a colonoscopy at [facility] - none of these results are available.  She reported that she has severe inflammation.  She was put on sulfazalasine in 2017, but she has been taking it on and off since then.  She does not know how extensive her colitis has been.   She states that for the last couple of months she feels some lower abdominal pressure along with lower abdominal pain, worse in the left side of the abdomen.  She has a BM every day, but recently she has been having a BM every other day. Stool has been normal in consistency.  The patient denies having any nausea, vomiting, fever, chills, hematochezia, melena, hematemesis, abdominal distention, abdominal pain, diarrhea, jaundice, pruritus or weight loss.  Denies any EIM.  Last flu shot:last year Last  pneumonia shot: Never Last DEXA scan: never COVID-19 shot: has received one shot, will receive next in one week  Last DXA:JOINO Last Colonoscopy: 2017 - inflammation  FHx: cousin had Crohn's disease, other far family members had CD, neg for any gastrointestinal/liver disease, lung cancer mother Social: neg smoking, quit alcohol in past, used to do multiple drugs including IV (heroin and cocaine), now on suboxone last OD was 08/2019 Surgical: cholecystectomy 2017  Past Medical History: Past Medical History:  Diagnosis Date  . Acute pulmonary embolism (HCC) 09/13/2013  . Asthma   . Bilateral ovarian cysts   . PE (pulmonary embolism) 09/2013  . Polysubstance abuse (HCC)   . Ulcerative colitis [facility])     Past Surgical History: Past Surgical History:  Procedure Laterality Date  . LAPAROSCOPIC APPENDECTOMY N/A 10/23/2016   Procedure: APPENDECTOMY LAPAROSCOPIC;  Surgeon: Franky Macho, MD;  Location: [facility];  Service: General;  Laterality: N/A;    Family History: Family History  Problem Relation Age of Onset  . Lung cancer Mother   . Cancer Mother     Social History: Social History   Tobacco Use  Smoking Status Never Smoker  Smokeless Tobacco Never Used   Social History   Substance and Sexual Activity  Alcohol Use No   Social History   Substance and Sexual Activity  Drug Use Yes  . Types: Benzodiazepines, Oxycodone   Comment: on suboxone    Allergies: No Known Allergies  Medications: Current Outpatient Medications  Medication Sig Dispense Refill  . acetaminophen (TYLENOL) 500 MG tablet Take 1,000 mg by mouth every 6 (six) hours as needed for  moderate pain.    Marland Kitchen albuterol (PROVENTIL) (2.5 MG/3ML) 0.083% nebulizer solution Take 3 mLs (2.5 mg total) by nebulization every 6 (six) hours as needed for wheezing or shortness of breath. 75 mL 1  . albuterol (VENTOLIN HFA) 108 (90 Base) MCG/ACT inhaler Inhale 1-2 puffs into the lungs every 6 (six) hours as needed for  wheezing or shortness of breath. 18 g 2  . buprenorphine-naloxone (SUBOXONE) 8-2 mg SUBL SL tablet Place 1 tablet under the tongue daily.    . cloNIDine (CATAPRES) 0.1 MG tablet Take 0.1 mg by mouth at bedtime.    Marland Kitchen QUEtiapine (SEROQUEL) 100 MG tablet Take 100 mg by mouth at bedtime.     No current facility-administered medications for this visit.    Review of Systems: GENERAL: negative for malaise, night sweats HEENT: No changes in hearing or vision, no nose bleeds or other nasal problems. NECK: Negative for lumps, goiter, pain and significant neck swelling RESPIRATORY: Negative for cough, wheezing CARDIOVASCULAR: Negative for chest pain, leg swelling, palpitations, orthopnea GI: SEE HPI MUSCULOSKELETAL: Negative for joint pain or swelling, back pain, and muscle pain. SKIN: Negative for lesions, rash PSYCH: Negative for sleep disturbance, mood disorder and recent psychosocial stressors. HEMATOLOGY Negative for prolonged bleeding, bruising easily, and swollen nodes. ENDOCRINE: Negative for cold or heat intolerance, polyuria, polydipsia and goiter. NEURO: negative for tremor, gait imbalance, syncope and seizures. The remainder of the review of systems is noncontributory.   Physical Exam: BP (!) 144/89 (BP Location: Right Arm, Patient Position: Sitting, Cuff Size: Normal)   Pulse 87   Temp 99 F (37.2 C) (Oral)   Ht 5\' 7"  (1.702 m)   Wt 183 lb 9.6 oz (83.3 kg)   LMP 04/28/2019   BMI 28.76 kg/m  GENERAL: The patient is AO x3, in no acute distress. HEENT: Head is normocephalic and atraumatic. EOMI are intact. Mouth is well hydrated and without lesions. NECK: Supple. No masses LUNGS: Clear to auscultation. No presence of rhonchi/wheezing/rales. Adequate chest expansion HEART: RRR, normal s1 and s2. ABDOMEN: Mildly distended in lower abdomen and mildly tender in the hypogastric area, no guarding, no peritoneal signs. BS +. No masses. EXTREMITIES: Without any cyanosis, clubbing,  rash, lesions or edema. NEUROLOGIC: AOx3, no focal motor deficit. SKIN: no jaundice, no rashes   Imaging/Labs: as above  I personally reviewed and interpreted the available labs, imaging and endoscopic files.  Impression and Plan: Melanie Mendoza is a 49 y.o. female with PMH UC, history of PE of unknown etiology off AC (possible thrombophilia), who presents for evaluation of ulcerative colitis.  The patient has not been on any medication to control her disease consistently.  It is unclear how active her disease is at this moment, as this could cause some of the abdominal pain and bloating she is presenting.  It is also unclear how extensive her disease has been in the past.  We will obtain baseline labs at this moment and we will schedule for a colonoscopy.  As she has been taking prednisone intermittently several times, a DEXA scan will be ordered to evaluate for osteoporosis.  Patient was also encouraged to finish her Covid vaccination and to obtain the flu and pneumonia shot.  The patient understood and agreed  - Schedule colonoscopy, may need biopsies every 10 cm depending on extension of inflammation - Schedule DEXA scan - Check CBC, CMP, B12, Vitamin D, CRP - Patient to ask PCP about pneumonia and flu shot - Complete COVID shot series - RTC  6 months  All questions were answered.      Katrinka Blazing, MD Gastroenterology and Hepatology [facility]

## 2020-07-28 ENCOUNTER — Other Ambulatory Visit (INDEPENDENT_AMBULATORY_CARE_PROVIDER_SITE_OTHER): Payer: Self-pay | Admitting: Gastroenterology

## 2020-07-28 DIAGNOSIS — R7989 Other specified abnormal findings of blood chemistry: Secondary | ICD-10-CM

## 2020-07-28 LAB — CBC WITH DIFFERENTIAL/PLATELET
Absolute Monocytes: 330 cells/uL (ref 200–950)
Basophils Absolute: 50 cells/uL (ref 0–200)
Basophils Relative: 1 %
Eosinophils Absolute: 200 cells/uL (ref 15–500)
Eosinophils Relative: 4 %
HCT: 39.9 % (ref 35.0–45.0)
Hemoglobin: 12.7 g/dL (ref 11.7–15.5)
Lymphs Abs: 1560 cells/uL (ref 850–3900)
MCH: 28.5 pg (ref 27.0–33.0)
MCHC: 31.8 g/dL — ABNORMAL LOW (ref 32.0–36.0)
MCV: 89.5 fL (ref 80.0–100.0)
MPV: 9.6 fL (ref 7.5–12.5)
Monocytes Relative: 6.6 %
Neutro Abs: 2860 cells/uL (ref 1500–7800)
Neutrophils Relative %: 57.2 %
Platelets: 373 10*3/uL (ref 140–400)
RBC: 4.46 10*6/uL (ref 3.80–5.10)
RDW: 12.7 % (ref 11.0–15.0)
Total Lymphocyte: 31.2 %
WBC: 5 10*3/uL (ref 3.8–10.8)

## 2020-07-28 LAB — COMPREHENSIVE METABOLIC PANEL
AG Ratio: 1.6 (calc) (ref 1.0–2.5)
ALT: 15 U/L (ref 6–29)
AST: 17 U/L (ref 10–35)
Albumin: 4.1 g/dL (ref 3.6–5.1)
Alkaline phosphatase (APISO): 84 U/L (ref 37–153)
BUN: 12 mg/dL (ref 7–25)
CO2: 32 mmol/L (ref 20–32)
Calcium: 9.3 mg/dL (ref 8.6–10.4)
Chloride: 102 mmol/L (ref 98–110)
Creat: 0.6 mg/dL (ref 0.50–1.05)
Globulin: 2.6 g/dL (calc) (ref 1.9–3.7)
Glucose, Bld: 100 mg/dL (ref 65–139)
Potassium: 4.6 mmol/L (ref 3.5–5.3)
Sodium: 142 mmol/L (ref 135–146)
Total Bilirubin: 0.4 mg/dL (ref 0.2–1.2)
Total Protein: 6.7 g/dL (ref 6.1–8.1)

## 2020-07-28 LAB — VITAMIN D 25 HYDROXY (VIT D DEFICIENCY, FRACTURES): Vit D, 25-Hydroxy: 29 ng/mL — ABNORMAL LOW (ref 30–100)

## 2020-07-28 LAB — SEDIMENTATION RATE: Sed Rate: 6 mm/h (ref 0–20)

## 2020-07-28 LAB — C-REACTIVE PROTEIN: CRP: 2.6 mg/L (ref <8.0)

## 2020-07-28 LAB — VITAMIN B12: Vitamin B-12: 371 pg/mL (ref 200–1100)

## 2020-07-28 MED ORDER — ERGOCALCIFEROL 50 MCG (2000 UT) PO CAPS: 1.0000 | ORAL_CAPSULE | Freq: Every day | ORAL | 11 refills | Status: DC

## 2020-07-28 NOTE — Progress Notes (Signed)
v

## 2020-08-01 ENCOUNTER — Ambulatory Visit (HOSPITAL_COMMUNITY): Payer: Medicaid Other

## 2020-08-01 ENCOUNTER — Encounter (HOSPITAL_COMMUNITY): Payer: Medicaid Other

## 2020-08-03 ENCOUNTER — Inpatient Hospital Stay (HOSPITAL_COMMUNITY): Admission: RE | Admit: 2020-08-03 | Payer: Medicaid Other | Source: Ambulatory Visit

## 2020-08-08 ENCOUNTER — Other Ambulatory Visit (INDEPENDENT_AMBULATORY_CARE_PROVIDER_SITE_OTHER): Payer: Self-pay | Admitting: *Deleted

## 2020-08-08 DIAGNOSIS — K5190 Ulcerative colitis, unspecified, without complications: Secondary | ICD-10-CM

## 2020-08-08 NOTE — Progress Notes (Signed)
cancel

## 2020-08-10 ENCOUNTER — Encounter (HOSPITAL_COMMUNITY): Payer: Self-pay | Admitting: Gastroenterology

## 2020-08-14 ENCOUNTER — Other Ambulatory Visit (HOSPITAL_COMMUNITY)
Admission: RE | Admit: 2020-08-14 | Discharge: 2020-08-14 | Disposition: A | Discharge status: Home / Self Care | Payer: Medicaid Other | Source: Ambulatory Visit | Attending: Gastroenterology | Admitting: Gastroenterology

## 2020-08-14 ENCOUNTER — Other Ambulatory Visit: Payer: Self-pay

## 2020-08-14 DIAGNOSIS — Z20822 Contact with and (suspected) exposure to covid-19: Secondary | ICD-10-CM | POA: Insufficient documentation

## 2020-08-14 DIAGNOSIS — Z01812 Encounter for preprocedural laboratory examination: Secondary | ICD-10-CM | POA: Insufficient documentation

## 2020-08-14 LAB — SARS CORONAVIRUS 2 (TAT 6-24 HRS): SARS Coronavirus 2: NEGATIVE

## 2020-08-15 ENCOUNTER — Encounter (HOSPITAL_COMMUNITY)
Admission: RE | Disposition: A | Discharge status: Home / Self Care | Payer: Self-pay | Source: Home / Self Care | Attending: Gastroenterology

## 2020-08-15 ENCOUNTER — Other Ambulatory Visit: Payer: Self-pay

## 2020-08-15 ENCOUNTER — Ambulatory Visit (HOSPITAL_COMMUNITY)
Admission: RE | Admit: 2020-08-15 | Discharge: 2020-08-15 | Disposition: A | Discharge status: Home / Self Care | Payer: Medicaid Other | Attending: Gastroenterology | Admitting: Gastroenterology

## 2020-08-15 ENCOUNTER — Encounter (HOSPITAL_COMMUNITY): Payer: Self-pay | Admitting: Gastroenterology

## 2020-08-15 ENCOUNTER — Ambulatory Visit (HOSPITAL_COMMUNITY): Payer: Medicaid Other

## 2020-08-15 DIAGNOSIS — R7989 Other specified abnormal findings of blood chemistry: Secondary | ICD-10-CM

## 2020-08-15 DIAGNOSIS — K648 Other hemorrhoids: Secondary | ICD-10-CM | POA: Diagnosis not present

## 2020-08-15 DIAGNOSIS — Z9049 Acquired absence of other specified parts of digestive tract: Secondary | ICD-10-CM | POA: Insufficient documentation

## 2020-08-15 DIAGNOSIS — Z79899 Other long term (current) drug therapy: Secondary | ICD-10-CM | POA: Diagnosis not present

## 2020-08-15 DIAGNOSIS — K51911 Ulcerative colitis, unspecified with rectal bleeding: Secondary | ICD-10-CM | POA: Insufficient documentation

## 2020-08-15 DIAGNOSIS — J45909 Unspecified asthma, uncomplicated: Secondary | ICD-10-CM | POA: Diagnosis not present

## 2020-08-15 DIAGNOSIS — I10 Essential (primary) hypertension: Secondary | ICD-10-CM | POA: Diagnosis not present

## 2020-08-15 DIAGNOSIS — K5190 Ulcerative colitis, unspecified, without complications: Secondary | ICD-10-CM | POA: Diagnosis not present

## 2020-08-15 HISTORY — PX: BIOPSY: SHX5522

## 2020-08-15 HISTORY — DX: Essential (primary) hypertension: I10

## 2020-08-15 HISTORY — PX: COLONOSCOPY WITH PROPOFOL: SHX5780

## 2020-08-15 LAB — HM COLONOSCOPY

## 2020-08-15 SURGERY — COLONOSCOPY WITH PROPOFOL
Anesthesia: General

## 2020-08-15 MED ORDER — ERGOCALCIFEROL 50 MCG (2000 UT) PO CAPS: 1.0000 | ORAL_CAPSULE | Freq: Every day | ORAL | 11 refills | Status: DC

## 2020-08-15 MED ORDER — LACTATED RINGERS IV SOLN: INTRAVENOUS | Status: DC

## 2020-08-15 MED ORDER — MESALAMINE 1.2 G PO TBEC: 2.4000 g | DELAYED_RELEASE_TABLET | Freq: Every day | ORAL | 5 refills | Status: DC

## 2020-08-15 MED ORDER — PROPOFOL 10 MG/ML IV BOLUS
INTRAVENOUS | Status: DC | PRN
  Administered 2020-08-15: 20 mg via INTRAVENOUS
  Administered 2020-08-15: 50 mg via INTRAVENOUS
  Administered 2020-08-15: 30 mg via INTRAVENOUS
  Administered 2020-08-15: 50 mg via INTRAVENOUS
  Administered 2020-08-15 (×2): 30 mg via INTRAVENOUS
  Administered 2020-08-15: 20 mg via INTRAVENOUS
  Administered 2020-08-15: 30 mg via INTRAVENOUS

## 2020-08-15 MED ORDER — PROPOFOL 500 MG/50ML IV EMUL
INTRAVENOUS | Status: DC | PRN
  Administered 2020-08-15: 150 ug/kg/min via INTRAVENOUS

## 2020-08-15 NOTE — Op Note (Addendum)
[facility] Patient Name: Melanie Mendoza Procedure Date: 08/15/2020 11:15 AM MRN: [id number] Date of Birth: 11-Feb-1971 Attending MD: Katrinka Blazing ,  CSN: [id number] Age: 49 Admit Type: Outpatient Procedure:                Colonoscopy Indications:              Ulcerative colitis Providers:                Katrinka Blazing, Criselda Peaches. Patsy Lager, RN, Lafonda Mosses, Technician, Dyann Ruddle Referring MD:              Medicines:                Monitored Anesthesia Care Complications:            No immediate complications. Estimated Blood Loss:     Estimated blood loss: none. Procedure:                Pre-Anesthesia Assessment:                           - Prior to the procedure, a History and Physical                            was performed, and patient medications, allergies                            and sensitivities were reviewed. The patient's                            tolerance of previous anesthesia was reviewed.                           - The risks and benefits of the procedure and the                            sedation options and risks were discussed with the                            patient. All questions were answered and informed                            consent was obtained.                           - ASA Grade Assessment: II - A patient with mild                            systemic disease.                           After obtaining informed consent, the colonoscope                            was passed under direct vision. Throughout the  procedure, the patient's blood pressure, pulse, and                            oxygen saturations were monitored continuously. The                            [id number]-HQ190L ([id number]) scope was introduced through                            the anus and advanced to the the cecum, identified                            by appendiceal orifice and ileocecal valve. The                             colonoscopy was performed without difficulty. The                            patient tolerated the procedure well. The quality                            of the bowel preparation was good. Scope withdrawal                            time was 20 minutes. Scope In: 11:30:00 AM Scope Out: 12:06:42 PM Scope Withdrawal Time: 0 hours 24 minutes 37 seconds  Total Procedure Duration: 0 hours 36 minutes 42 seconds  Findings:      The perianal and digital rectal examinations were normal.      The terminal ileum appeared normal.      The colon (entire examined portion) appeared normal. These findings       correspond to a Mayo 0 throughout the colon. Untargeted biopsies were       taken with a cold forceps for histology every 10 cm - surveillance       protocol for UC.      Non-bleeding internal hemorrhoids were found during retroflexion. The       hemorrhoids were medium-sized. Impression:               - The examined portion of the ileum was normal.                           - The entire examined colon is normal. Biopsied.                           - Non-bleeding internal hemorrhoids. Moderate Sedation:      Per Anesthesia Care Recommendation:           - Discharge patient to home (ambulatory).                           - Resume previous diet.                           - Await pathology results.                           -  Repeat colonoscopy for surveillance based on                            pathology results.                           - Start Lialda 2.4 g qday. Procedure Code(s):        --- Professional ---                           [phone], GC, Colonoscopy, flexible; with biopsy,                            single or multiple Diagnosis Code(s):        --- Professional ---                           K64.8, Other hemorrhoids                           K51.90, Ulcerative colitis, unspecified, without                            complications CPT copyright 2019 American Medical  Association. All rights reserved. The codes documented in this report are preliminary and upon coder review may  be revised to meet current compliance requirements. Katrinka Blazing, MD Katrinka Blazing,  08/15/2020 12:14:49 PM This report has been signed electronically. Number of Addenda: 0

## 2020-08-15 NOTE — Interval H&P Note (Signed)
History and Physical Interval Note:  08/15/2020 10:26 AM Melanie Mendoza is a 49 y.o. female with PMH UC, history of PE of unknown etiology off AC (possible thrombophilia), who comes to the hospital for endoscopic evaluation of ulcerative colitis.  The patient was diagnosed ulcerative colitis 22 years ago. This unknown the extension of her disease but she has been on and off prednisone throughout her life. She was on sulfasalazine in 2017 but is not currently taking any medication. Has had intermittent episodes of lower abdominal pain but is only having one bowel movement every day without any blood, consistency is adequate. Denies any other complaint at the moment.  Most recent labs showed normal CRP, ESR, CBC and CMP. Was noted to have mildly decreased vitamin D for which she was prescribed vitamin D supplementation.  BP 133/83   Pulse 91   Temp 98.5 F (36.9 C) (Oral)   Resp (!) 22   Ht '5\' 7"'  (1.702 m)   Wt 83.3 kg   LMP 04/28/2019   SpO2 95%   BMI 28.76 kg/m  GENERAL: The patient is AO x3, in no acute distress. HEENT: Head is normocephalic and atraumatic. EOMI are intact. Mouth is well hydrated and without lesions. NECK: Supple. No masses LUNGS: Clear to auscultation. No presence of rhonchi/wheezing/rales. Adequate chest expansion HEART: RRR, normal s1 and s2. ABDOMEN: Soft, nontender, no guarding, no peritoneal signs, and nondistended. BS +. No masses. EXTREMITIES: Without any cyanosis, clubbing, rash, lesions or edema. NEUROLOGIC: AOx3, no focal motor deficit. SKIN: no jaundice, no rashes   Melanie Mendoza  has presented today for surgery, with the diagnosis of ulerctive colitis.  The various methods of treatment have been discussed with the patient and family. After consideration of risks, benefits and other options for treatment, the patient has consented to  Procedure(s) with comments: COLONOSCOPY WITH PROPOFOL (N/A) - 1030 as a surgical intervention.  The patient's history has  been reviewed, patient examined, no change in status, stable for surgery.  I have reviewed the patient's chart and labs.  Questions were answered to the patient's satisfaction.     Maylon Peppers Mayorga

## 2020-08-15 NOTE — Anesthesia Postprocedure Evaluation (Signed)
Anesthesia Post Note  Patient: Melanie Mendoza  Procedure(s) Performed: COLONOSCOPY WITH PROPOFOL (N/A ) BIOPSY  Patient location during evaluation: PACU Anesthesia Type: General Level of consciousness: awake and alert and oriented Pain management: pain level controlled Vital Signs Assessment: post-procedure vital signs reviewed and stable Respiratory status: spontaneous breathing Cardiovascular status: blood pressure returned to baseline and stable Postop Assessment: no apparent nausea or vomiting Anesthetic complications: no   No complications documented.   Last Vitals:  Vitals:   08/15/20 0927 08/15/20 1215  BP: 133/83   Pulse: 91 77  Resp: (!) 22 15  Temp: 36.9 C 36.4 C  SpO2: 95% 98%    Last Pain:  Vitals:   08/15/20 1215  TempSrc: Oral  PainSc: 0-No pain                 Riyan Gavina,Timica

## 2020-08-15 NOTE — Discharge Instructions (Signed)
Colonoscopy, Adult, Care After This sheet gives you information about how to care for yourself after your procedure. Your doctor may also give you more specific instructions. If you have problems or questions, call your doctor. What can I expect after the procedure? After the procedure, it is common to have:  A small amount of blood in your poop (stool) for 24 hours.  Some gas.  Mild cramping or bloating in your belly (abdomen). Follow these instructions at home: Eating and drinking   Drink enough fluid to keep your pee (urine) pale yellow.  Follow instructions from your doctor about what you cannot eat or drink.  Return to your normal diet as told by your doctor. Avoid heavy or fried foods that are hard to digest. Activity  Rest as told by your doctor.  Do not sit for a long time without moving. Get up to take short walks every 1-2 hours. This is important. Ask for help if you feel weak or unsteady.  Return to your normal activities as told by your doctor. Ask your doctor what activities are safe for you. To help cramping and bloating:   Try walking around.  Put heat on your belly as told by your doctor. Use the heat source that your doctor recommends, such as a moist heat pack or a heating pad. ? Put a towel between your skin and the heat source. ? Leave the heat on for 20-30 minutes. ? Remove the heat if your skin turns bright red. This is very important if you are unable to feel pain, heat, or cold. You may have a greater risk of getting burned. General instructions  For the first 24 hours after the procedure: ? Do not drive or use machinery. ? Do not sign important documents. ? Do not drink alcohol. ? Do your daily activities more slowly than normal. ? Eat foods that are soft and easy to digest.  Take over-the-counter or prescription medicines only as told by your doctor.  Keep all follow-up visits as told by your doctor. This is important. Contact a doctor  if:  You have blood in your poop 2-3 days after the procedure. Get help right away if:  You have more than a small amount of blood in your poop.  You see large clumps of tissue (blood clots) in your poop.  Your belly is swollen.  You feel like you may vomit (nauseous).  You vomit.  You have a fever.  You have belly pain that gets worse, and medicine does not help your pain. Summary  After the procedure, it is common to have a small amount of blood in your poop. You may also have mild cramping and bloating in your belly.  For the first 24 hours after the procedure, do not drive or use machinery, do not sign important documents, and do not drink alcohol.  Get help right away if you have a lot of blood in your poop, feel like you may vomit, have a fever, or have more belly pain. This information is not intended to replace advice given to you by your health care provider. Make sure you discuss any questions you have with your health care provider. Document Revised: 04/26/2019 Document Reviewed: 04/26/2019 Elsevier Patient Education  2020 [employer] Hemorrhoids Hemorrhoids are swollen veins that may develop:  In the butt (rectum). These are called internal hemorrhoids.  Around the opening of the butt (anus). These are called external hemorrhoids. Hemorrhoids can cause pain, itching, or bleeding. Most of  the time, they do not cause serious problems. They usually get better with diet changes, lifestyle changes, and other home treatments. What are the causes? This condition may be caused by:  Having trouble pooping (constipation).  Pushing hard (straining) to poop.  Watery poop (diarrhea).  Pregnancy.  Being very overweight (obese).  Sitting for long periods of time.  Heavy lifting or other activity that causes you to strain.  Anal sex.  Riding a bike for a long period of time. What are the signs or symptoms? Symptoms of this condition include:  Pain.  Itching  or soreness in the butt.  Bleeding from the butt.  Leaking poop.  Swelling in the area.  One or more lumps around the opening of your butt. How is this diagnosed? A doctor can often diagnose this condition by looking at the affected area. The doctor may also:  Do an exam that involves feeling the area with a gloved hand (digital rectal exam).  Examine the area inside your butt using a small tube (anoscope).  Order blood tests. This may be done if you have lost a lot of blood.  Have you get a test that involves looking inside the colon using a flexible tube with a camera on the end (sigmoidoscopy or colonoscopy). How is this treated? This condition can usually be treated at home. Your doctor may tell you to change what you eat, make lifestyle changes, or try home treatments. If these do not help, procedures can be done to remove the hemorrhoids or make them smaller. These may involve:  Placing rubber bands at the base of the hemorrhoids to cut off their blood supply.  Injecting medicine into the hemorrhoids to shrink them.  Shining a type of light energy onto the hemorrhoids to cause them to fall off.  Doing surgery to remove the hemorrhoids or cut off their blood supply. Follow these instructions at home: Eating and drinking   Eat foods that have a lot of fiber in them. These include whole grains, beans, nuts, fruits, and vegetables.  Ask your doctor about taking products that have added fiber (fibersupplements).  Reduce the amount of fat in your diet. You can do this by: ? Eating low-fat dairy products. ? Eating less red meat. ? Avoiding processed foods.  Drink enough fluid to keep your pee (urine) pale yellow. Managing pain and swelling   Take a warm-water bath (sitz bath) for 20 minutes to ease pain. Do this 3-4 times a day. You may do this in a bathtub or using a portable sitz bath that fits over the toilet.  If told, put ice on the painful area. It may be helpful  to use ice between your warm baths. ? Put ice in a plastic bag. ? Place a towel between your skin and the bag. ? Leave the ice on for 20 minutes, 2-3 times a day. General instructions  Take over-the-counter and prescription medicines only as told by your doctor. ? Medicated creams and medicines may be used as told.  Exercise often. Ask your doctor how much and what kind of exercise is best for you.  Go to the bathroom when you have the urge to poop. Do not wait.  Avoid pushing too hard when you poop.  Keep your butt dry and clean. Use wet toilet paper or moist towelettes after pooping.  Do not sit on the toilet for a long time.  Keep all follow-up visits as told by your doctor. This is important. Contact a  doctor if you:  Have pain and swelling that do not get better with treatment or medicine.  Have trouble pooping.  Cannot poop.  Have pain or swelling outside the area of the hemorrhoids. Get help right away if you have:  Bleeding that will not stop. Summary  Hemorrhoids are swollen veins in the butt or around the opening of the butt.  They can cause pain, itching, or bleeding.  Eat foods that have a lot of fiber in them. These include whole grains, beans, nuts, fruits, and vegetables.  Take a warm-water bath (sitz bath) for 20 minutes to ease pain. Do this 3-4 times a day. This information is not intended to replace advice given to you by your health care provider. Make sure you discuss any questions you have with your health care provider. Document Revised: 10/08/2018 Document Reviewed: 02/19/2018 Elsevier Patient Education  2020 [employer]. Diverticulosis  Diverticulosis is a condition that develops when small pouches (diverticula) form in the wall of the large intestine (colon). The colon is where water is absorbed and stool (feces) is formed. The pouches form when the inside layer of the colon pushes through weak spots in the outer layers of the colon. You may  have a few pouches or many of them. The pouches usually do not cause problems unless they become inflamed or infected. When this happens, the condition is called diverticulitis. What are the causes? The cause of this condition is not known. What increases the risk? The following factors may make you more likely to develop this condition:  Being older than age 42. Your risk for this condition increases with age. Diverticulosis is rare among people younger than age 29. By age 60, many people have it.  Eating a low-fiber diet.  Having frequent constipation.  Being overweight.  Not getting enough exercise.  Smoking.  Taking over-the-counter pain medicines, like aspirin and ibuprofen.  Having a family history of diverticulosis. What are the signs or symptoms? In most people, there are no symptoms of this condition. If you do have symptoms, they may include:  Bloating.  Cramps in the abdomen.  Constipation or diarrhea.  Pain in the lower left side of the abdomen. How is this diagnosed? Because diverticulosis usually has no symptoms, it is most often diagnosed during an exam for other colon problems. The condition may be diagnosed by:  Using a flexible scope to examine the colon (colonoscopy).  Taking an X-ray of the colon after dye has been put into the colon (barium enema).  Having a CT scan. How is this treated? You may not need treatment for this condition. Your health care provider may recommend treatment to prevent problems. You may need treatment if you have symptoms or if you previously had diverticulitis. Treatment may include:  Eating a high-fiber diet.  Taking a fiber supplement.  Taking a live bacteria supplement (probiotic).  Taking medicine to relax your colon. Follow these instructions at home: Medicines  Take over-the-counter and prescription medicines only as told by your health care provider.  If told by your health care provider, take a fiber  supplement or probiotic. Constipation prevention Your condition may cause constipation. To prevent or treat constipation, you may need to:  Drink enough fluid to keep your urine pale yellow.  Take over-the-counter or prescription medicines.  Eat foods that are high in fiber, such as beans, whole grains, and fresh fruits and vegetables.  Limit foods that are high in fat and processed sugars, such as fried or  sweet foods.  General instructions  Try not to strain when you have a bowel movement.  Keep all follow-up visits as told by your health care provider. This is important. Contact a health care provider if you:  Have pain in your abdomen.  Have bloating.  Have cramps.  Have not had a bowel movement in 3 days. Get help right away if:  Your pain gets worse.  Your bloating becomes very bad.  You have a fever or chills, and your symptoms suddenly get worse.  You vomit.  You have bowel movements that are bloody or black.  You have bleeding from your rectum. Summary  Diverticulosis is a condition that develops when small pouches (diverticula) form in the wall of the large intestine (colon).  You may have a few pouches or many of them.  This condition is most often diagnosed during an exam for other colon problems.  Treatment may include increasing the fiber in your diet, taking supplements, or taking medicines. This information is not intended to replace advice given to you by your health care provider. Make sure you discuss any questions you have with your health care provider. Document Revised: 04/29/2019 Document Reviewed: 04/29/2019 Elsevier Patient Education  2020 [employer].   You are being discharged to home.  Resume your previous diet.  We are waiting for your pathology results.  Your physician has recommended a repeat colonoscopy for surveillance based on pathology results.  Start Lialda 2.4 g qday.

## 2020-08-15 NOTE — Anesthesia Preprocedure Evaluation (Signed)
Anesthesia Evaluation  Patient identified by MRN, date of birth, ID band Patient awake    Reviewed: Allergy & Precautions, H&P , NPO status , Patient's Chart, lab work & pertinent test results, reviewed documented beta blocker date and time   Airway Mallampati: II  TM Distance: >3 FB Neck ROM: full    Dental no notable dental hx. (+) Teeth Intact   Pulmonary asthma ,    Pulmonary exam normal breath sounds clear to auscultation       Cardiovascular Exercise Tolerance: Good hypertension, negative cardio ROS   Rhythm:regular Rate:Normal     Neuro/Psych negative neurological ROS  negative psych ROS   GI/Hepatic Neg liver ROS, PUD,   Endo/Other  negative endocrine ROS  Renal/GU negative Renal ROS  negative genitourinary   Musculoskeletal   Abdominal   Peds  Hematology  (+) Blood dyscrasia, anemia ,   Anesthesia Other Findings   Reproductive/Obstetrics negative OB ROS                             Anesthesia Physical Anesthesia Plan  ASA: III  Anesthesia Plan: General   Post-op Pain Management:    Induction:   PONV Risk Score and Plan: Propofol infusion  Airway Management Planned:   Additional Equipment:   Intra-op Plan:   Post-operative Plan:   Informed Consent: I have reviewed the patients History and Physical, chart, labs and discussed the procedure including the risks, benefits and alternatives for the proposed anesthesia with the patient or authorized representative who has indicated his/her understanding and acceptance.     Dental Advisory Given  Plan Discussed with: CRNA  Anesthesia Plan Comments:         Anesthesia Quick Evaluation

## 2020-08-15 NOTE — Transfer of Care (Signed)
Immediate Anesthesia Transfer of Care Note  Patient: Melanie Mendoza  Procedure(s) Performed: COLONOSCOPY WITH PROPOFOL (N/A ) BIOPSY  Patient Location: PACU  Anesthesia Type:General  Level of Consciousness: awake  Airway & Oxygen Therapy: Patient Spontanous Breathing  Post-op Assessment: Report given to RN  Post vital signs: Reviewed  Last Vitals:  Vitals Value Taken Time  BP    Temp    Pulse    Resp    SpO2      Last Pain:  Vitals:   08/15/20 1122  TempSrc:   PainSc: 0-No pain      Patients Stated Pain Goal: 6 (08/15/20 0927)  Complications: No complications documented.

## 2020-08-15 NOTE — Addendum Note (Signed)
Addendum  created 08/15/20 1342 by Lorin Glass, CRNA   Charge Capture section accepted

## 2020-08-16 LAB — SURGICAL PATHOLOGY

## 2020-08-21 ENCOUNTER — Encounter (HOSPITAL_COMMUNITY): Payer: Self-pay | Admitting: Gastroenterology

## 2020-08-22 ENCOUNTER — Other Ambulatory Visit (INDEPENDENT_AMBULATORY_CARE_PROVIDER_SITE_OTHER): Payer: Self-pay | Admitting: Gastroenterology

## 2020-08-22 ENCOUNTER — Ambulatory Visit (HOSPITAL_COMMUNITY)
Admission: RE | Admit: 2020-08-22 | Discharge: 2020-08-22 | Disposition: A | Discharge status: Home / Self Care | Payer: Medicaid Other | Source: Ambulatory Visit | Attending: Adult Health | Admitting: Adult Health

## 2020-08-22 ENCOUNTER — Other Ambulatory Visit (HOSPITAL_COMMUNITY): Payer: Medicaid Other

## 2020-08-22 ENCOUNTER — Ambulatory Visit (HOSPITAL_COMMUNITY)
Admission: RE | Admit: 2020-08-22 | Discharge: 2020-08-22 | Disposition: A | Discharge status: Home / Self Care | Payer: Medicaid Other | Source: Ambulatory Visit | Attending: Gastroenterology | Admitting: Gastroenterology

## 2020-08-22 ENCOUNTER — Other Ambulatory Visit: Payer: Self-pay

## 2020-08-22 DIAGNOSIS — M810 Age-related osteoporosis without current pathological fracture: Secondary | ICD-10-CM

## 2020-08-22 DIAGNOSIS — Z8739 Personal history of other diseases of the musculoskeletal system and connective tissue: Secondary | ICD-10-CM | POA: Diagnosis not present

## 2020-08-22 DIAGNOSIS — N6321 Unspecified lump in the left breast, upper outer quadrant: Secondary | ICD-10-CM

## 2020-08-22 DIAGNOSIS — K529 Noninfective gastroenteritis and colitis, unspecified: Secondary | ICD-10-CM | POA: Diagnosis not present

## 2020-08-22 DIAGNOSIS — K5190 Ulcerative colitis, unspecified, without complications: Secondary | ICD-10-CM | POA: Insufficient documentation

## 2020-08-22 DIAGNOSIS — Z780 Asymptomatic menopausal state: Secondary | ICD-10-CM | POA: Diagnosis not present

## 2020-08-22 DIAGNOSIS — R928 Other abnormal and inconclusive findings on diagnostic imaging of breast: Secondary | ICD-10-CM | POA: Diagnosis not present

## 2020-08-22 MED ORDER — ALENDRONATE SODIUM 70 MG PO TABS: 70.0000 mg | ORAL_TABLET | ORAL | 0 refills | Status: DC

## 2020-08-22 MED ORDER — CALCIUM 250 MG PO CAPS: 1.0000 | ORAL_CAPSULE | Freq: Every day | ORAL | 0 refills | Status: DC

## 2020-08-28 ENCOUNTER — Other Ambulatory Visit: Payer: Medicaid Other

## 2020-09-12 ENCOUNTER — Other Ambulatory Visit (INDEPENDENT_AMBULATORY_CARE_PROVIDER_SITE_OTHER): Payer: Self-pay | Admitting: Gastroenterology

## 2020-09-12 DIAGNOSIS — M810 Age-related osteoporosis without current pathological fracture: Secondary | ICD-10-CM

## 2020-09-12 NOTE — Telephone Encounter (Signed)
Please ask patient to follow up with PCP for further refills, I prescribed the initial therapy but further refills need to be given by PCP  Thanks,  Katrinka Blazing, MD Gastroenterology and Hepatology [facility]

## 2020-09-13 NOTE — Telephone Encounter (Signed)
Thanks

## 2020-09-13 NOTE — Telephone Encounter (Signed)
I left a detailed message explaining recommendation to follow up with her PCP for refills

## 2020-09-19 ENCOUNTER — Encounter (HOSPITAL_COMMUNITY): Payer: Medicaid Other

## 2020-09-19 ENCOUNTER — Other Ambulatory Visit: Payer: Self-pay

## 2020-09-19 ENCOUNTER — Encounter: Payer: Self-pay | Admitting: Internal Medicine

## 2020-09-19 ENCOUNTER — Ambulatory Visit
Admission: EM | Admit: 2020-09-19 | Discharge: 2020-09-19 | Disposition: A | Discharge status: Home / Self Care | Payer: Medicaid Other | Attending: Internal Medicine | Admitting: Internal Medicine

## 2020-09-19 DIAGNOSIS — H81311 Aural vertigo, right ear: Secondary | ICD-10-CM | POA: Diagnosis not present

## 2020-09-19 MED ORDER — MECLIZINE HCL 12.5 MG PO TABS: 12.5000 mg | ORAL_TABLET | Freq: Three times a day (TID) | ORAL | 0 refills | Status: DC | PRN

## 2020-09-19 MED ORDER — PREDNISONE 20 MG PO TABS: 20.0000 mg | ORAL_TABLET | Freq: Every day | ORAL | 0 refills | Status: AC

## 2020-09-19 NOTE — ED Provider Notes (Addendum)
[facility]    CSN: [id number] Arrival date & time: 09/19/20  1614      History   Chief Complaint Dizziness and feeling of the room spinning around her over the past 3 days  HPI Melanie Mendoza is a 49 y.o. female comes to the urgent care with complaints of dizziness which started 3 to 4 days ago.  Onset was fairly sudden and has been persistent.  Patient describes dizziness as feeling of the room spinning around her.  She has some right ear fullness.  No hearing loss.  No ringing in the right yes.  This is the first episode of uncertain event.  No drainage from the ear.  No trauma to the head.  Patient has tried vertigo maneuvres without any improvement.  HPI  Past Medical History:  Diagnosis Date  . Acute pulmonary embolism (HCC) 09/13/2013  . Asthma   . Bilateral ovarian cysts   . Hypertension   . PE (pulmonary embolism) 09/2013  . Polysubstance abuse (HCC)   . Ulcerative colitis [facility])     Patient Active Problem List   Diagnosis Date Noted  . Ulcerative colitis (HCC) 07/27/2020  . Pelvic pressure in female 05/25/2020  . Hot flashes 05/25/2020  . Mood swings 05/25/2020  . Screening for colorectal cancer 05/19/2019  . Screening examination for STD (sexually transmitted disease) 05/19/2019  . Well woman exam with routine gynecological exam 05/19/2019  . Wheezing 05/19/2019  . Mass of upper outer quadrant of left breast 05/19/2019  . Peri-menopause 05/19/2019  . Acute appendicitis 10/22/2016  . Asthma exacerbation 07/03/2016  . Hyperlipidemia 07/01/2016  . Prediabetes 07/01/2016  . Long term (current) use of anticoagulants [Z79.01] 05/08/2016  . On anticoagulant therapy 04/16/2016  . Obesity, unspecified 04/16/2016  . Manipulative behavior 09/13/2013  . Acute pulmonary embolism (HCC) 09/13/2013  . Asthma with bronchitis 09/08/2013  . Polysubstance abuse (HCC) 09/08/2013  . Narcotic abuse (HCC) 09/08/2013  . Hypoxia 09/08/2013  . DVT of leg (deep venous  thrombosis) (HCC) 09/08/2013  . Anemia 09/08/2013  . Overdose of benzodiazepine 08/20/2013  . Acute encephalopathy 08/20/2013  . Acute respiratory failure (HCC) 08/20/2013  . Asthma 08/20/2013  . Leukocytosis 08/20/2013    Past Surgical History:  Procedure Laterality Date  . BIOPSY  08/15/2020   Procedure: BIOPSY;  Surgeon: Dolores Frame, MD;  Location: [facility];  Service: Gastroenterology;;  colon  . COLONOSCOPY WITH PROPOFOL N/A 08/15/2020   Procedure: COLONOSCOPY WITH PROPOFOL;  Surgeon: Dolores Frame, MD;  Location: [facility];  Service: Gastroenterology;  Laterality: N/A;  1030  . LAPAROSCOPIC APPENDECTOMY N/A 10/23/2016   Procedure: APPENDECTOMY LAPAROSCOPIC;  Surgeon: Franky Macho, MD;  Location: [facility];  Service: General;  Laterality: N/A;    OB History    Gravida  2   Para  2   Term      Preterm      AB      Living  2     SAB      TAB      Ectopic      Multiple      Live Births               Home Medications    Prior to Admission medications   Medication Sig Start Date End Date Taking? Authorizing Provider  alendronate (FOSAMAX) 70 MG tablet TAKE (1) TABLET BY MOUTH ONCE WEEKLY (TAKE WITH FULL GLASS OF WATER ON A EMPTY STOMACH) 09/12/20   Levon Hedger  Alisia Ferrari, MD  albuterol (PROVENTIL) (2.5 MG/3ML) 0.083% nebulizer solution Take 3 mLs (2.5 mg total) by nebulization every 6 (six) hours as needed for wheezing or shortness of breath. Patient taking differently: Take 2.5 mg by nebulization daily.  01/19/20   Wurst, [place], PA-C  albuterol (VENTOLIN HFA) 108 (90 Base) MCG/ACT inhaler Inhale 1-2 puffs into the lungs every 6 (six) hours as needed for wheezing or shortness of breath. 01/19/20   Wurst, [place], PA-C  baclofen (LIORESAL) 10 MG tablet Take 10 mg by mouth 3 (three) times daily as needed for pain. 07/12/20   [provider]  Buprenorphine HCl-Naloxone HCl 8-2 MG FILM Place 1 tablet under the tongue in the  morning and at bedtime.     [provider]  Calcium 250 MG CAPS Take 1 tablet by mouth daily. 08/22/20   Dolores Frame, MD  cetirizine (ZYRTEC) 10 MG tablet Take 10 mg by mouth daily.    [provider]  cloNIDine (CATAPRES) 0.1 MG tablet Take 0.1 mg by mouth at bedtime.    [provider]  Ergocalciferol 50 MCG (2000 UT) CAPS Take 1 each by mouth daily. 08/15/20   Dolores Frame, MD  meclizine (ANTIVERT) 12.5 MG tablet Take 1 tablet (12.5 mg total) by mouth 3 (three) times daily as needed for dizziness. 09/19/20   Merrilee Jansky, MD  mesalamine (LIALDA) 1.2 g EC tablet Take 2 tablets (2.4 g total) by mouth daily with breakfast. 08/15/20   Marguerita Merles, Reuel Boom, MD  Omega-3 1000 MG CAPS Take 1,000 mg by mouth daily.    [provider]  predniSONE (DELTASONE) 20 MG tablet Take 1 tablet (20 mg total) by mouth daily for 5 days. 09/19/20 09/24/20  Merrilee Jansky, MD  QUEtiapine (SEROQUEL) 100 MG tablet Take 100 mg by mouth at bedtime.    [provider]  fluticasone (FLOVENT HFA) 110 MCG/ACT inhaler Inhale 2 puffs into the lungs daily. 07/04/16 01/19/20  Kerrick Miler Aspen, Limmie Patricia, MD  mometasone-formoterol (DULERA) 100-5 MCG/ACT AERO Inhale 2 puffs into the lungs 2 (two) times daily. 09/10/16 01/19/20  Jacquelin Hawking, PA-C  sulfaSALAzine (AZULFIDINE) 500 MG tablet Take 500 mg by mouth 2 (two) times daily.   01/19/20  [provider]    Family History Family History  Problem Relation Age of Onset  . Lung cancer Mother   . Cancer Mother     Social History Social History   Tobacco Use  . Smoking status: Never Smoker  . Smokeless tobacco: Never Used  Vaping Use  . Vaping Use: Never used  Substance Use Topics  . Alcohol use: No  . Drug use: Yes    Types: Benzodiazepines, Oxycodone    Comment: on suboxone     Allergies   Patient has no known allergies.   Review of Systems Review of Systems   Constitutional: Negative.   HENT: Negative for congestion, ear discharge, ear pain, sore throat and tinnitus.   Eyes: Negative.   Respiratory: Negative.   Gastrointestinal: Negative for diarrhea, nausea and vomiting.     Physical Exam Triage Vital Signs ED Triage Vitals [09/19/20 1823]  Enc Vitals Group     BP (!) 151/92     Pulse Rate 77     Resp 18     Temp 98.1 F (36.7 C)     Temp src      SpO2 96 %     Weight      Height  Head Circumference      Peak Flow      Pain Score      Pain Loc      Pain Edu?      Excl. in GC?    Orthostatic VS for the past 24 hrs:  BP- Lying Pulse- Lying BP- Sitting Pulse- Sitting BP- Standing at 0 minutes Pulse- Standing at 0 minutes  09/19/20 1826 (!) 151/92 70 (!) 151/99 78 (!) 148/93 83    Updated Vital Signs BP (!) 151/92   Pulse 77   Temp 98.1 F (36.7 C)   Resp 18   LMP 04/28/2019   SpO2 96%   Visual Acuity Right Eye Distance:   Left Eye Distance:   Bilateral Distance:    Right Eye Near:   Left Eye Near:    Bilateral Near:     Physical Exam Vitals and nursing note reviewed.  Constitutional:      General: She is not in acute distress.    Appearance: She is not ill-appearing.  HENT:     Right Ear: Tympanic membrane normal.     Left Ear: Tympanic membrane normal.     Nose: Nose normal.  Cardiovascular:     Rate and Rhythm: Normal rate and regular rhythm.     Pulses: Normal pulses.     Heart sounds: Normal heart sounds.  Abdominal:     General: Bowel sounds are normal.  Musculoskeletal:        General: Normal range of motion.     Cervical back: Normal range of motion and neck supple.  Skin:    General: Skin is warm.     Capillary Refill: Capillary refill takes less than 2 seconds.  Neurological:     General: No focal deficit present.     Mental Status: She is alert and oriented to person, place, and time.     Cranial Nerves: No cranial nerve deficit.     Sensory: No sensory deficit.     Motor: No  weakness.      UC Treatments / Results  Labs (all labs ordered are listed, but only abnormal results are displayed) Labs Reviewed - No data to display  EKG   Radiology No results found.  Procedures Procedures (including critical care time)  Medications Ordered in UC Medications - No data to display  Initial Impression / Assessment and Plan / UC Course  I have reviewed the triage vital signs and the nursing notes.  Pertinent labs & imaging results that were available during my care of the patient were reviewed by me and considered in my medical decision making (see chart for details).     1. 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 Final Clinical Impressions(s) / UC Diagnoses   Final diagnoses:  Auditory vertigo involving right ear   Discharge Instructions   None    ED Prescriptions    Medication Sig Dispense Auth. Provider   meclizine (ANTIVERT) 12.5 MG tablet Take 1 tablet (12.5 mg total) by mouth 3 (three) times daily as needed for dizziness. 15 tablet Jen Eppinger, Britta Mccreedy, MD   predniSONE (DELTASONE) 20 MG tablet Take 1 tablet (20 mg total) by mouth daily for 5 days. 5 tablet Jeptha Hinnenkamp, Britta Mccreedy, MD     PDMP not reviewed this encounter.   Merrilee Jansky, MD 09/20/20 1241    Merrilee Jansky, MD 09/20/20 1242

## 2020-09-19 NOTE — ED Triage Notes (Signed)
Pt presents with dizziness that began on Friday, states it is worse with movement. Began taking fosamax on Friday as well

## 2020-09-25 ENCOUNTER — Encounter (INDEPENDENT_AMBULATORY_CARE_PROVIDER_SITE_OTHER): Payer: Self-pay | Admitting: *Deleted

## 2020-09-28 ENCOUNTER — Ambulatory Visit: Payer: Medicaid Other | Admitting: Adult Health

## 2020-10-03 DIAGNOSIS — I10 Essential (primary) hypertension: Secondary | ICD-10-CM | POA: Diagnosis not present

## 2020-10-03 DIAGNOSIS — Z6830 Body mass index (BMI) 30.0-30.9, adult: Secondary | ICD-10-CM | POA: Diagnosis not present

## 2020-10-03 DIAGNOSIS — H81399 Other peripheral vertigo, unspecified ear: Secondary | ICD-10-CM | POA: Diagnosis not present

## 2020-10-03 DIAGNOSIS — J45909 Unspecified asthma, uncomplicated: Secondary | ICD-10-CM | POA: Diagnosis not present

## 2020-10-03 DIAGNOSIS — M25512 Pain in left shoulder: Secondary | ICD-10-CM | POA: Diagnosis not present

## 2020-11-02 DIAGNOSIS — S46812A Strain of other muscles, fascia and tendons at shoulder and upper arm level, left arm, initial encounter: Secondary | ICD-10-CM | POA: Diagnosis not present

## 2020-11-02 DIAGNOSIS — Z6831 Body mass index (BMI) 31.0-31.9, adult: Secondary | ICD-10-CM | POA: Diagnosis not present

## 2021-01-02 ENCOUNTER — Ambulatory Visit (INDEPENDENT_AMBULATORY_CARE_PROVIDER_SITE_OTHER): Payer: Medicaid Other | Admitting: Adult Health

## 2021-01-02 ENCOUNTER — Other Ambulatory Visit: Payer: Self-pay

## 2021-01-02 ENCOUNTER — Encounter: Payer: Self-pay | Admitting: Adult Health

## 2021-01-02 VITALS — BP 136/81 | HR 81 | Ht 67.0 in | Wt 203.0 lb

## 2021-01-02 DIAGNOSIS — N898 Other specified noninflammatory disorders of vagina: Secondary | ICD-10-CM

## 2021-01-02 DIAGNOSIS — R232 Flushing: Secondary | ICD-10-CM

## 2021-01-02 DIAGNOSIS — B9689 Other specified bacterial agents as the cause of diseases classified elsewhere: Secondary | ICD-10-CM

## 2021-01-02 DIAGNOSIS — R4589 Other symptoms and signs involving emotional state: Secondary | ICD-10-CM

## 2021-01-02 DIAGNOSIS — N760 Acute vaginitis: Secondary | ICD-10-CM | POA: Insufficient documentation

## 2021-01-02 LAB — POCT WET PREP (WET MOUNT)
Clue Cells Wet Prep Whiff POC: NEGATIVE
WBC, Wet Prep HPF POC: POSITIVE

## 2021-01-02 MED ORDER — METRONIDAZOLE 500 MG PO TABS: 500.0000 mg | ORAL_TABLET | Freq: Two times a day (BID) | ORAL | 0 refills | Status: DC

## 2021-01-02 MED ORDER — PAROXETINE HCL 10 MG PO TABS: 10.0000 mg | ORAL_TABLET | Freq: Every day | ORAL | 2 refills | Status: DC

## 2021-01-02 NOTE — Progress Notes (Signed)
Patient ID: MCKAYLIE VASEY, female   DOB: [birth year], 50 y.o.   MRN: [id number] History of Present Illness: Darlena is a 50 year old white female,single,PM, in complaining of vaginal odor, vaginal discharge esp after sex, and she douched. Has hot flashes, and is moody. She has history of PE. Last pap 05/19/2019 normal with negative HPV. PCP is Dr Olena Leatherwood.    Current Medications, Allergies, Past Medical History, Past Surgical History, Family History and Social History were reviewed in [employer] record.     Review of Systems: +vaginal odor, esp after sex +vaginal discharge +hot flashes +moody     Physical Exam:BP 136/81 (BP Location: Right Arm, Patient Position: Sitting, Cuff Size: Normal)   Pulse 81   Ht 5\' 7"  (1.702 m)   Wt 203 lb (92.1 kg)   LMP 04/28/2019   BMI 31.79 kg/m  General:  Well developed, well nourished, no acute distress Skin:  Warm and dry Neck:  Midline trachea, normal thyroid, good ROM, no lymphadenopathy Lungs; Clear to auscultation bilaterally Cardiovascular: Regular rate and rhythm  Pelvic:  External genitalia is normal in appearance, no lesions.  The vagina is normal in appearance,has white creamy discharge with no odor today. Urethra has no lesions or masses. The cervix is bulbous.  Uterus is felt to be normal size, shape, and contour.  No adnexal masses or tenderness noted.Bladder is non tender, no masses felt. Wet prep:+WBCs and clue cells  Psych:  alert and cooperative,seems happy  Upstream - 01/02/21 1016      Pregnancy Intention Screening   Does the patient want to become pregnant in the next year? No    Does the patient's partner want to become pregnant in the next year? No    Would the patient like to discuss contraceptive options today? No      Contraception Wrap Up   Current Method No Method - Other Reason   PM   End Method No Method - Other Reason   PM   Contraception Counseling Provided No         Co exam with 01/04/21 NP student   Impression and Plan: 1. Vaginal odor Will rx flagyl  Try withdrawal too  2. Vaginal discharge Will rx flagyl   3. BV (bacterial vaginosis) Will rx flagyl Meds ordered this encounter  Medications  . metroNIDAZOLE (FLAGYL) 500 MG tablet    Sig: Take 1 tablet (500 mg total) by mouth 2 (two) times daily.    Dispense:  14 tablet    Refill:  0    Order Specific Question:   Supervising Provider    Answer:   [place] Charon, LUTHER H [[id number]]  . PARoxetine (PAXIL) 10 MG tablet    Sig: Take 1 tablet (10 mg total) by mouth daily.    Dispense:  30 tablet    Refill:  2    Order Specific Question:   Supervising Provider    Answer:   Despina Hidden, LUTHER H [[id number]]    4. Hot flashes Will try paxil, since had PE, HRT not an option  5. Despina Hidden

## 2021-01-25 ENCOUNTER — Encounter (INDEPENDENT_AMBULATORY_CARE_PROVIDER_SITE_OTHER): Payer: Self-pay | Admitting: Gastroenterology

## 2021-01-25 ENCOUNTER — Telehealth (INDEPENDENT_AMBULATORY_CARE_PROVIDER_SITE_OTHER): Payer: Self-pay

## 2021-01-25 ENCOUNTER — Ambulatory Visit (INDEPENDENT_AMBULATORY_CARE_PROVIDER_SITE_OTHER): Payer: Medicaid Other | Admitting: Gastroenterology

## 2021-01-25 NOTE — Telephone Encounter (Signed)
Patient no showed for appt with Dr. Katrinka Blazing 01/25/2021. Referred by Dr. Polly Cobia.

## 2021-01-31 DIAGNOSIS — H5213 Myopia, bilateral: Secondary | ICD-10-CM | POA: Diagnosis not present

## 2021-02-08 DIAGNOSIS — Z6830 Body mass index (BMI) 30.0-30.9, adult: Secondary | ICD-10-CM | POA: Diagnosis not present

## 2021-02-08 DIAGNOSIS — Z0000 Encounter for general adult medical examination without abnormal findings: Secondary | ICD-10-CM | POA: Diagnosis not present

## 2021-02-09 DIAGNOSIS — I10 Essential (primary) hypertension: Secondary | ICD-10-CM | POA: Diagnosis not present

## 2021-02-09 DIAGNOSIS — J449 Chronic obstructive pulmonary disease, unspecified: Secondary | ICD-10-CM | POA: Diagnosis not present

## 2021-02-28 ENCOUNTER — Ambulatory Visit: Payer: Medicaid Other | Admitting: Adult Health

## 2021-04-12 ENCOUNTER — Ambulatory Visit: Payer: Medicaid Other | Admitting: Adult Health

## 2021-04-12 DIAGNOSIS — S46812A Strain of other muscles, fascia and tendons at shoulder and upper arm level, left arm, initial encounter: Secondary | ICD-10-CM | POA: Diagnosis not present

## 2021-04-12 DIAGNOSIS — J45909 Unspecified asthma, uncomplicated: Secondary | ICD-10-CM | POA: Diagnosis not present

## 2021-04-12 DIAGNOSIS — I10 Essential (primary) hypertension: Secondary | ICD-10-CM | POA: Diagnosis not present

## 2021-04-12 DIAGNOSIS — M818 Other osteoporosis without current pathological fracture: Secondary | ICD-10-CM | POA: Diagnosis not present

## 2021-04-12 DIAGNOSIS — Z6830 Body mass index (BMI) 30.0-30.9, adult: Secondary | ICD-10-CM | POA: Diagnosis not present

## 2021-05-03 ENCOUNTER — Other Ambulatory Visit: Payer: Self-pay | Admitting: Adult Health

## 2021-05-18 ENCOUNTER — Ambulatory Visit
Admission: EM | Admit: 2021-05-18 | Discharge: 2021-05-18 | Disposition: A | Discharge status: Home / Self Care | Payer: Medicaid Other | Attending: Emergency Medicine | Admitting: Emergency Medicine

## 2021-05-18 ENCOUNTER — Encounter: Payer: Self-pay | Admitting: Emergency Medicine

## 2021-05-18 DIAGNOSIS — R059 Cough, unspecified: Secondary | ICD-10-CM

## 2021-05-18 MED ORDER — BENZONATATE 100 MG PO CAPS: 100.0000 mg | ORAL_CAPSULE | Freq: Three times a day (TID) | ORAL | 0 refills | Status: DC

## 2021-05-18 MED ORDER — PREDNISONE 20 MG PO TABS: 20.0000 mg | ORAL_TABLET | Freq: Two times a day (BID) | ORAL | 0 refills | Status: AC

## 2021-05-18 NOTE — ED Triage Notes (Signed)
Shortness of breath , cough and congestion x 1 week.

## 2021-05-18 NOTE — ED Provider Notes (Signed)
[facility]   [id number] 05/18/21 Arrival Time: 1722   CC: COVID symptoms  SUBJECTIVE: History from: patient.  Melanie Mendoza is a 50 y.o. female who presents with shortness of breath, cough, and congestion x 1 week.  Reports sick contacts.  Denies covid exposure.  Reports relief with breathing treatment.  Denies fever, wheezing, chest pain, nausea, changes in bowel or bladder habits.    ROS: As per HPI.  All other pertinent ROS negative.     Past Medical History:  Diagnosis Date   Acute pulmonary embolism (HCC) 09/13/2013   Asthma    Bilateral ovarian cysts    Hypertension    PE (pulmonary embolism) 09/2013   Polysubstance abuse (HCC)    Ulcerative colitis (HCC)    Past Surgical History:  Procedure Laterality Date   BIOPSY  08/15/2020   Procedure: BIOPSY;  Surgeon: Dolores Frame, MD;  Location: [facility];  Service: Gastroenterology;;  colon   COLONOSCOPY WITH PROPOFOL N/A 08/15/2020   Procedure: COLONOSCOPY WITH PROPOFOL;  Surgeon: Dolores Frame, MD;  Location: [facility];  Service: Gastroenterology;  Laterality: N/A;  1030   LAPAROSCOPIC APPENDECTOMY N/A 10/23/2016   Procedure: APPENDECTOMY LAPAROSCOPIC;  Surgeon: Franky Macho, MD;  Location: [facility];  Service: General;  Laterality: N/A;   No Known Allergies No current facility-administered medications on file prior to encounter.   Current Outpatient Medications on File Prior to Encounter  Medication Sig Dispense Refill   ADVAIR DISKUS 250-50 MCG/DOSE AEPB Inhale 1 puff into the lungs 2 (two) times daily.     albuterol (PROVENTIL) (2.5 MG/3ML) 0.083% nebulizer solution Take 3 mLs (2.5 mg total) by nebulization every 6 (six) hours as needed for wheezing or shortness of breath. (Patient taking differently: Take 2.5 mg by nebulization daily.) 75 mL 1   albuterol (VENTOLIN HFA) 108 (90 Base) MCG/ACT inhaler Inhale 1-2 puffs into the lungs every 6 (six) hours as needed for wheezing or shortness  of breath. 18 g 2   alendronate (FOSAMAX) 70 MG tablet TAKE (1) TABLET BY MOUTH ONCE WEEKLY (TAKE WITH FULL GLASS OF WATER ON A EMPTY STOMACH) 4 tablet 0   amLODipine (NORVASC) 2.5 MG tablet Take 2.5 mg by mouth daily.     baclofen (LIORESAL) 10 MG tablet Take 10 mg by mouth 3 (three) times daily as needed for pain.     Buprenorphine HCl-Naloxone HCl 8-2 MG FILM Place 1 tablet under the tongue in the morning and at bedtime.      Calcium 250 MG CAPS Take 1 tablet by mouth daily. 30 capsule 0   cetirizine (ZYRTEC) 10 MG tablet Take 10 mg by mouth daily.     cloNIDine (CATAPRES) 0.1 MG tablet Take 0.1 mg by mouth at bedtime.     diclofenac Sodium (VOLTAREN) 1 % GEL Apply 2 g topically 4 (four) times daily.     Ergocalciferol 50 MCG (2000 UT) CAPS Take 1 each by mouth daily. 30 capsule 11   LATUDA 40 MG TABS tablet SMARTSIG:1 Tablet(s) By Mouth Every Evening     meclizine (ANTIVERT) 25 MG tablet Take 25 mg by mouth daily as needed.     mesalamine (LIALDA) 1.2 g EC tablet Take 2 tablets (2.4 g total) by mouth daily with breakfast. 60 tablet 5   metroNIDAZOLE (FLAGYL) 500 MG tablet Take 1 tablet (500 mg total) by mouth 2 (two) times daily. 14 tablet 0   Omega-3 1000 MG CAPS Take 1,000 mg by mouth daily.  PARoxetine (PAXIL) 10 MG tablet TAKE 1 TABLET BY MOUTH ONCE A DAY. 30 tablet 0   QUEtiapine (SEROQUEL) 100 MG tablet Take 100 mg by mouth at bedtime.     [DISCONTINUED] fluticasone (FLOVENT HFA) 110 MCG/ACT inhaler Inhale 2 puffs into the lungs daily. 1 Inhaler 12   [DISCONTINUED] mometasone-formoterol (DULERA) 100-5 MCG/ACT AERO Inhale 2 puffs into the lungs 2 (two) times daily. 3 Inhaler 2   [DISCONTINUED] sulfaSALAzine (AZULFIDINE) 500 MG tablet Take 500 mg by mouth 2 (two) times daily.      Social History   Socioeconomic History   Marital status: Single    Spouse name: Not on file   Number of children: Not on file   Years of education: Not on file   Highest education level: Not on file   Occupational History   Not on file  Tobacco Use   Smoking status: Never   Smokeless tobacco: Never  Vaping Use   Vaping Use: Never used  Substance and Sexual Activity   Alcohol use: No   Drug use: Yes    Types: Benzodiazepines, Oxycodone    Comment: on suboxone   Sexual activity: Yes    Birth control/protection: Post-menopausal  Other Topics Concern   Not on file  Social History Narrative   Not on file   Social Determinants of Health   Financial Resource Strain: Not on file  Food Insecurity: Not on file  Transportation Needs: Not on file  Physical Activity: Not on file  Stress: Not on file  Social Connections: Not on file  Intimate Partner Violence: Not on file   Family History  Problem Relation Age of Onset   Lung cancer Mother    Cancer Mother     OBJECTIVE:  Vitals:   05/18/21 1832  BP: (!) 156/81  Pulse: 96  Resp: 16  Temp: 97.7 F (36.5 C)  TempSrc: Oral  SpO2: 91%    General appearance: alert; mildly fatigued appearing, nontoxic; speaking in full sentences and tolerating own secretions HEENT: NCAT; Ears: EACs clear, TMs pearly gray; Eyes: PERRL.  EOM grossly intact.Nose: nares patent without rhinorrhea, Throat: oropharynx clear, tonsils non erythematous or enlarged, uvula midline  Neck: supple without LAD Lungs: unlabored respirations, symmetrical air entry; cough: absent; no respiratory distress; CTAB Heart: regular rate and rhythm.  Skin: warm and dry Psychological: alert and cooperative; normal mood and affect   ASSESSMENT & PLAN:  1. Cough     Meds ordered this encounter  Medications   benzonatate (TESSALON) 100 MG capsule    Sig: Take 1 capsule (100 mg total) by mouth every 8 (eight) hours.    Dispense:  21 capsule    Refill:  0    Order Specific Question:   Supervising Provider    Answer:   Eustace Moore [[id number]]   predniSONE (DELTASONE) 20 MG tablet    Sig: Take 1 tablet (20 mg total) by mouth 2 (two) times daily with a meal  for 5 days.    Dispense:  10 tablet    Refill:  0    Order Specific Question:   Supervising Provider    Answer:   Eustace Moore [[id number]]    COVID testing ordered.  It will take between 5-7 days for test results.  Someone will contact you regarding abnormal results.    In the meantime: You should remain isolated in your home for 5 days from symptom onset AND greater than 72 hours after symptoms resolution (absence of fever without  the use of fever-reducing medication and improvement in respiratory symptoms), whichever is longer Get plenty of rest and push fluids Tessalon Perles prescribed for cough Use OTC zyrtec for nasal congestion, runny nose, and/or sore throat Use OTC flonase for nasal congestion and runny nose Use medications daily for symptom relief Use OTC medications like ibuprofen or tylenol as needed fever or pain Call or go to the ED if you have any new or worsening symptoms such as fever, worsening cough, shortness of breath, chest tightness, chest pain, turning blue, changes in mental status, etc...   Prednisone prescribed for wheezing.  Patient instructed to go to ED if symptoms did not improve with prednisone over the next 12-24 hours.    Reviewed expectations re: course of current medical issues. Questions answered. Outlined signs and symptoms indicating need for more acute intervention. Patient verbalized understanding. After Visit Summary given.          Rennis Harding, PA-C 05/18/21 1907

## 2021-05-18 NOTE — Discharge Instructions (Addendum)

## 2021-05-21 ENCOUNTER — Ambulatory Visit: Payer: Medicaid Other | Admitting: Women's Health

## 2021-05-21 LAB — NOVEL CORONAVIRUS, NAA: SARS-CoV-2, NAA: NOT DETECTED

## 2021-06-20 DIAGNOSIS — Z6830 Body mass index (BMI) 30.0-30.9, adult: Secondary | ICD-10-CM | POA: Diagnosis not present

## 2021-06-20 DIAGNOSIS — M818 Other osteoporosis without current pathological fracture: Secondary | ICD-10-CM | POA: Diagnosis not present

## 2021-06-20 DIAGNOSIS — J4540 Moderate persistent asthma, uncomplicated: Secondary | ICD-10-CM | POA: Diagnosis not present

## 2021-06-20 DIAGNOSIS — I10 Essential (primary) hypertension: Secondary | ICD-10-CM | POA: Diagnosis not present

## 2021-06-25 ENCOUNTER — Other Ambulatory Visit: Payer: Self-pay

## 2021-06-25 MED ORDER — PAROXETINE HCL 10 MG PO TABS: 10.0000 mg | ORAL_TABLET | Freq: Every day | ORAL | 3 refills | Status: DC

## 2021-07-30 ENCOUNTER — Other Ambulatory Visit: Payer: Self-pay

## 2021-07-30 ENCOUNTER — Other Ambulatory Visit (HOSPITAL_COMMUNITY)
Admission: RE | Admit: 2021-07-30 | Discharge: 2021-07-30 | Disposition: A | Discharge status: Home / Self Care | Payer: Medicaid Other | Source: Ambulatory Visit | Attending: Adult Health | Admitting: Adult Health

## 2021-07-30 ENCOUNTER — Encounter: Payer: Self-pay | Admitting: Adult Health

## 2021-07-30 ENCOUNTER — Ambulatory Visit: Payer: Medicaid Other | Admitting: Adult Health

## 2021-07-30 VITALS — BP 124/89 | HR 95 | Ht 67.0 in | Wt 194.5 lb

## 2021-07-30 DIAGNOSIS — N941 Unspecified dyspareunia: Secondary | ICD-10-CM

## 2021-07-30 DIAGNOSIS — R232 Flushing: Secondary | ICD-10-CM

## 2021-07-30 DIAGNOSIS — N898 Other specified noninflammatory disorders of vagina: Secondary | ICD-10-CM | POA: Insufficient documentation

## 2021-07-30 DIAGNOSIS — N949 Unspecified condition associated with female genital organs and menstrual cycle: Secondary | ICD-10-CM | POA: Diagnosis not present

## 2021-07-30 MED ORDER — VENLAFAXINE HCL ER 75 MG PO CP24: 75.0000 mg | ORAL_CAPSULE | Freq: Every day | ORAL | 3 refills | Status: DC

## 2021-07-30 NOTE — Progress Notes (Signed)
  Subjective:     Patient ID: Melanie Mendoza, female   DOB: [birth year], 50 y.o.   MRN: [id number]  HPI Melanie Mendoza is a 50 year old white female, single with SO, in complaining of pain with sex and in vagina and hot flashes. PCP is Dr  Olena Leatherwood.  Lab Results  Component Value Date   DIAGPAP  05/19/2019    NEGATIVE FOR INTRAEPITHELIAL LESIONS OR MALIGNANCY.   HPV NOT Detected 05/19/2019    Review of Systems Pain with sex and in vagina +hot flashes,Paxil not helping  Reviewed past medical,surgical, social and family history. Reviewed medications and allergies.     Objective:   Physical Exam BP 124/89 (BP Location: Left Arm, Patient Position: Sitting, Cuff Size: Normal)   Pulse 95   Ht 5\' 7"  (1.702 m)   Wt 194 lb 8 oz (88.2 kg)   LMP 04/28/2019   BMI 30.46 kg/m     Skin warm and dry.Pelvic: external genitalia is normal in appearance no lesions, vagina: creamy discharge without odor,urethra has no lesions or masses noted, cervix:smooth, uterus: normal size, shape and contour,+ tender, no masses felt, adnexa: no masses or tenderness noted. Bladder is non tender and no masses felt. CV swab sent. Fall risk is low  Upstream - 07/30/21 1620       Pregnancy Intention Screening   Does the patient want to become pregnant in the next year? N/A    Does the patient's partner want to become pregnant in the next year? N/A    Would the patient like to discuss contraceptive options today? N/A      Contraception Wrap Up   Current Method No Method - Other Reason   postmenopausal   End Method No Method - Other Reason   postmenopausal   Contraception Counseling Provided No            Examination chaperoned by 08/01/21 LPN  Assessment:     1. Vaginal odor CV swab sent for GC/CHL,trich,BV and yeast  - Cervicovaginal ancillary only( [place])  2. Vaginal discharge CV swab sent - Cervicovaginal ancillary only( [place])  3. Dyspareunia in female Use astroglide, and increased  foreplay  4. Uterine tenderness Malachy Mood scheduled for 10/21 at 1:30 pm at [facility]  - [facility] PELVIC COMPLETE WITH TRANSVAGINAL; Future  5. Hot flashes Will stop Paxil and try Effexor Meds ordered this encounter  Medications   venlafaxine XR (EFFEXOR-XR) 75 MG 24 hr capsule    Sig: Take 1 capsule (75 mg total) by mouth daily.    Dispense:  30 capsule    Refill:  3    Order Specific Question:   Supervising Provider    Answer:   [place] [[id number]]       Plan:     Follow up in 8 weeks for ROS

## 2021-08-01 LAB — CERVICOVAGINAL ANCILLARY ONLY
Bacterial Vaginitis (gardnerella): NEGATIVE
Candida Glabrata: NEGATIVE
Candida Vaginitis: NEGATIVE
Chlamydia: NEGATIVE
Comment: NEGATIVE
Comment: NEGATIVE
Comment: NEGATIVE
Comment: NEGATIVE
Comment: NEGATIVE
Comment: NORMAL
Neisseria Gonorrhea: NEGATIVE
Trichomonas: NEGATIVE

## 2021-08-03 ENCOUNTER — Ambulatory Visit (HOSPITAL_COMMUNITY): Payer: Medicaid Other | Attending: Adult Health

## 2021-09-19 ENCOUNTER — Ambulatory Visit (HOSPITAL_COMMUNITY)
Admission: RE | Admit: 2021-09-19 | Discharge: 2021-09-19 | Disposition: A | Discharge status: Home / Self Care | Payer: Medicaid Other | Source: Ambulatory Visit | Attending: Adult Health | Admitting: Adult Health

## 2021-09-19 ENCOUNTER — Other Ambulatory Visit: Payer: Self-pay

## 2021-09-19 DIAGNOSIS — N941 Unspecified dyspareunia: Secondary | ICD-10-CM | POA: Diagnosis not present

## 2021-09-19 DIAGNOSIS — N949 Unspecified condition associated with female genital organs and menstrual cycle: Secondary | ICD-10-CM | POA: Insufficient documentation

## 2021-09-19 DIAGNOSIS — R102 Pelvic and perineal pain: Secondary | ICD-10-CM | POA: Diagnosis not present

## 2021-09-24 ENCOUNTER — Ambulatory Visit: Payer: Medicaid Other | Admitting: Adult Health

## 2021-10-18 DIAGNOSIS — M1710 Unilateral primary osteoarthritis, unspecified knee: Secondary | ICD-10-CM | POA: Diagnosis not present

## 2021-10-18 DIAGNOSIS — J4540 Moderate persistent asthma, uncomplicated: Secondary | ICD-10-CM | POA: Diagnosis not present

## 2021-10-18 DIAGNOSIS — I10 Essential (primary) hypertension: Secondary | ICD-10-CM | POA: Diagnosis not present

## 2021-10-18 DIAGNOSIS — M818 Other osteoporosis without current pathological fracture: Secondary | ICD-10-CM | POA: Diagnosis not present

## 2021-10-18 DIAGNOSIS — Z6831 Body mass index (BMI) 31.0-31.9, adult: Secondary | ICD-10-CM | POA: Diagnosis not present

## 2021-10-29 ENCOUNTER — Ambulatory Visit (INDEPENDENT_AMBULATORY_CARE_PROVIDER_SITE_OTHER): Payer: Medicaid Other | Admitting: Adult Health

## 2021-10-29 ENCOUNTER — Ambulatory Visit (HOSPITAL_COMMUNITY)
Admission: RE | Admit: 2021-10-29 | Discharge: 2021-10-29 | Disposition: A | Discharge status: Home / Self Care | Payer: Medicaid Other | Source: Ambulatory Visit | Attending: Internal Medicine | Admitting: Internal Medicine

## 2021-10-29 ENCOUNTER — Encounter: Payer: Self-pay | Admitting: Adult Health

## 2021-10-29 ENCOUNTER — Other Ambulatory Visit: Payer: Self-pay

## 2021-10-29 ENCOUNTER — Other Ambulatory Visit (HOSPITAL_COMMUNITY): Payer: Self-pay | Admitting: Internal Medicine

## 2021-10-29 VITALS — BP 139/84 | HR 90 | Ht 67.0 in | Wt 204.6 lb

## 2021-10-29 DIAGNOSIS — M25562 Pain in left knee: Secondary | ICD-10-CM | POA: Diagnosis not present

## 2021-10-29 DIAGNOSIS — R232 Flushing: Secondary | ICD-10-CM

## 2021-10-29 DIAGNOSIS — N941 Unspecified dyspareunia: Secondary | ICD-10-CM | POA: Diagnosis not present

## 2021-10-29 DIAGNOSIS — M7989 Other specified soft tissue disorders: Secondary | ICD-10-CM | POA: Diagnosis not present

## 2021-10-29 MED ORDER — PAROXETINE HCL 20 MG PO TABS: 20.0000 mg | ORAL_TABLET | Freq: Every day | ORAL | 3 refills | Status: DC

## 2021-10-29 NOTE — Progress Notes (Signed)
°  Subjective:     Patient ID: Melanie Mendoza, female   DOB: 11-Nov-1970, 51 y.o.   MRN: [id number]  HPI Melanie Mendoza is a 51 year old white female, with SO, PM in complaining of hot flashes and pain with sex. She stopped effexor and wants to go back on paxil. SO is not understanding. Lab Results  Component Value Date   DIAGPAP  05/19/2019    NEGATIVE FOR INTRAEPITHELIAL LESIONS OR MALIGNANCY.   HPV NOT Detected 05/19/2019   PCP is Dr Olena Leatherwood.   Review of Systems +hot flashes +pain with sex Reviewed past medical,surgical, social and family history. Reviewed medications and allergies.     Objective:   Physical Exam BP 139/84 (BP Location: Right Arm, Patient Position: Sitting, Cuff Size: Normal)    Pulse 90    Ht 5\' 7"  (1.702 m)    Wt 204 lb 9.6 oz (92.8 kg)    LMP 04/28/2019    BMI 32.04 kg/m     Skin warm and dry. Lungs: clear to ausculation bilaterally. Cardiovascular: regular rate and rhythm.  Reviewed 04/30/2019 again, was all normal.  Upstream - 10/29/21 1154       Pregnancy Intention Screening   Does the patient want to become pregnant in the next year? No    Does the patient's partner want to become pregnant in the next year? No    Would the patient like to discuss contraceptive options today? No      Contraception Wrap Up   Current Method --   PM   End Method --   PM   Contraception Counseling Provided No             Assessment:     1. Dyspareunia in female Talk with partner Continue lubrication   2. Hot flashes Will refill Paxil Meds ordered this encounter  Medications   PARoxetine (PAXIL) 20 MG tablet    Sig: Take 1 tablet (20 mg total) by mouth daily.    Dispense:  30 tablet    Refill:  3    Order Specific Question:   Supervising Provider    Answer:   10/31/21, LUTHER H [[id number]]    Try 8-16 ozs soy milk per day Get Women's Natural transition     Plan:     Follow up in 8 weeks

## 2022-01-10 DIAGNOSIS — I10 Essential (primary) hypertension: Secondary | ICD-10-CM | POA: Diagnosis not present

## 2022-01-10 DIAGNOSIS — Z6831 Body mass index (BMI) 31.0-31.9, adult: Secondary | ICD-10-CM | POA: Diagnosis not present

## 2022-01-10 DIAGNOSIS — F3132 Bipolar disorder, current episode depressed, moderate: Secondary | ICD-10-CM | POA: Diagnosis not present

## 2022-01-10 DIAGNOSIS — M818 Other osteoporosis without current pathological fracture: Secondary | ICD-10-CM | POA: Diagnosis not present

## 2022-01-10 DIAGNOSIS — M1710 Unilateral primary osteoarthritis, unspecified knee: Secondary | ICD-10-CM | POA: Diagnosis not present

## 2022-01-10 DIAGNOSIS — J4540 Moderate persistent asthma, uncomplicated: Secondary | ICD-10-CM | POA: Diagnosis not present

## 2022-03-19 ENCOUNTER — Other Ambulatory Visit: Payer: Self-pay | Admitting: *Deleted

## 2022-03-20 MED ORDER — PAROXETINE HCL 20 MG PO TABS: 20.0000 mg | ORAL_TABLET | Freq: Every day | ORAL | 3 refills | Status: DC

## 2022-04-30 DIAGNOSIS — Z0000 Encounter for general adult medical examination without abnormal findings: Secondary | ICD-10-CM | POA: Diagnosis not present

## 2022-04-30 DIAGNOSIS — Z6828 Body mass index (BMI) 28.0-28.9, adult: Secondary | ICD-10-CM | POA: Diagnosis not present

## 2022-07-19 ENCOUNTER — Encounter: Payer: Self-pay | Admitting: Emergency Medicine

## 2022-07-19 ENCOUNTER — Ambulatory Visit
Admission: EM | Admit: 2022-07-19 | Discharge: 2022-07-19 | Disposition: A | Discharge status: Home / Self Care | Payer: Medicaid Other | Attending: Family Medicine | Admitting: Family Medicine

## 2022-07-19 DIAGNOSIS — L237 Allergic contact dermatitis due to plants, except food: Secondary | ICD-10-CM | POA: Diagnosis not present

## 2022-07-19 MED ORDER — METHYLPREDNISOLONE SODIUM SUCC 125 MG IJ SOLR
80.0000 mg | Freq: Once | INTRAMUSCULAR | Status: AC
  Administered 2022-07-19: 80 mg via INTRAMUSCULAR

## 2022-07-19 MED ORDER — PREDNISONE 10 MG PO TABS: ORAL_TABLET | ORAL | 0 refills | Status: DC

## 2022-07-19 MED ORDER — TRIAMCINOLONE ACETONIDE 0.1 % EX CREA: 1.0000 | TOPICAL_CREAM | Freq: Two times a day (BID) | CUTANEOUS | 0 refills | Status: DC

## 2022-07-19 NOTE — ED Provider Notes (Signed)
[facility]    CSN: [id number] Arrival date & time: 07/19/22  1735      History   Chief Complaint No chief complaint on file.   HPI Melanie Mendoza is a 51 y.o. female.   Presenting today with 1 day history of poison oak rash on bilateral arms, legs, neck started yesterday after cleaning out an area beside a building that had poison oak.  Denies any new products at home, foods, medications that may have caused the rash.  Has been trying calamine and bleach with no relief.    Past Medical History:  Diagnosis Date   Acute pulmonary embolism (HCC) 09/13/2013   Asthma    Bilateral ovarian cysts    Hypertension    PE (pulmonary embolism) 09/2013   Polysubstance abuse (HCC)    Ulcerative colitis [facility])     Patient Active Problem List   Diagnosis Date Noted   Dyspareunia in female 07/30/2021   Uterine tenderness 07/30/2021   BV (bacterial vaginosis) 01/02/2021   Vaginal discharge 01/02/2021   Vaginal odor 01/02/2021   Ulcerative colitis (HCC) 07/27/2020   Pelvic pressure in female 05/25/2020   Hot flashes 05/25/2020   Mood swings 05/25/2020   Screening for colorectal cancer 05/19/2019   Screening examination for STD (sexually transmitted disease) 05/19/2019   Well woman exam with routine gynecological exam 05/19/2019   Wheezing 05/19/2019   Mass of upper outer quadrant of left breast 05/19/2019   Peri-menopause 05/19/2019   Acute appendicitis 10/22/2016   Asthma exacerbation 07/03/2016   Hyperlipidemia 07/01/2016   Prediabetes 07/01/2016   Long term (current) use of anticoagulants [Z79.01] 05/08/2016   On anticoagulant therapy 04/16/2016   Obesity, unspecified 04/16/2016   Manipulative behavior 09/13/2013   Acute pulmonary embolism (HCC) 09/13/2013   Asthma with bronchitis 09/08/2013   Polysubstance abuse (HCC) 09/08/2013   Narcotic abuse (HCC) 09/08/2013   Hypoxia 09/08/2013   DVT of leg (deep venous thrombosis) (HCC) 09/08/2013   Anemia 09/08/2013    Overdose of benzodiazepine 08/20/2013   Acute encephalopathy 08/20/2013   Acute respiratory failure (HCC) 08/20/2013   Asthma 08/20/2013   Leukocytosis 08/20/2013    Past Surgical History:  Procedure Laterality Date   BIOPSY  08/15/2020   Procedure: BIOPSY;  Surgeon: Dolores Frame, MD;  [facility]: [facility];  Service: Gastroenterology;;  colon   COLONOSCOPY WITH PROPOFOL N/A 08/15/2020   Procedure: COLONOSCOPY WITH PROPOFOL;  Surgeon: Dolores Frame, MD;  Location: [facility];  Service: Gastroenterology;  Laterality: N/A;  1030   LAPAROSCOPIC APPENDECTOMY N/A 10/23/2016   Procedure: APPENDECTOMY LAPAROSCOPIC;  Surgeon: Franky Macho, MD;  Location: [facility];  Service: General;  Laterality: N/A;    OB History     Gravida  2   Para  2   Term      Preterm      AB      Living  2      SAB      IAB      Ectopic      Multiple      Live Births               Home Medications    Prior to Admission medications   Medication Sig Start Date End Date Taking? Authorizing Provider  predniSONE (DELTASONE) 10 MG tablet Take 6 tabs day one, 5 tabs day two, 4 tabs day three, etc 07/19/22  Yes Particia Nearing, PA-C  triamcinolone cream (KENALOG) 0.1 % Apply 1  Application topically 2 (two) times daily. 07/19/22  Yes Particia Nearing, PA-C  albuterol (PROVENTIL) (2.5 MG/3ML) 0.083% nebulizer solution Take 3 mLs (2.5 mg total) by nebulization every 6 (six) hours as needed for wheezing or shortness of breath. Patient taking differently: Take 2.5 mg by nebulization daily. 01/19/20   Wurst, [place], PA-C  albuterol (VENTOLIN HFA) 108 (90 Base) MCG/ACT inhaler Inhale 1-2 puffs into the lungs every 6 (six) hours as needed for wheezing or shortness of breath. 01/19/20   Wurst, [place], PA-C  ALPRAZolam Prudy Feeler) 1 MG tablet Take by mouth. 07/25/21   [provider]  amLODipine (NORVASC) 2.5 MG tablet Take 2.5 mg by mouth daily. 09/08/20   [provider]  amphetamine-dextroamphetamine (ADDERALL) 20 MG tablet Take 20 mg by mouth 2 (two) times daily. 07/25/21   [provider]  buprenorphine (SUBUTEX) 8 MG SUBL SL tablet 8 mg 3 (three) times daily. 07/25/21   [provider]  cetirizine (ZYRTEC) 10 MG tablet Take 10 mg by mouth daily.    [provider]  cloNIDine (CATAPRES) 0.1 MG tablet Take 0.1 mg by mouth at bedtime.    [provider]  furosemide (LASIX) 20 MG tablet Take 20 mg by mouth daily. 06/20/21   [provider]  gabapentin (NEURONTIN) 300 MG capsule Take 100 mg by mouth daily. 06/11/21   [provider]  mesalamine (LIALDA) 1.2 g EC tablet Take 2 tablets (2.4 g total) by mouth daily with breakfast. 08/15/20   Marguerita Merles, Reuel Boom, MD  Omega-3 1000 MG CAPS Take 1,000 mg by mouth daily.    [provider]  PARoxetine (PAXIL) 20 MG tablet Take 1 tablet (20 mg total) by mouth daily. 03/20/22   Adline Potter, NP  QUEtiapine (SEROQUEL) 100 MG tablet Take 50 mg by mouth at bedtime.    [provider]  SYMBICORT 160-4.5 MCG/ACT inhaler Inhale 2 puffs into the lungs 2 (two) times daily. 06/20/21   [provider]  fluticasone (FLOVENT HFA) 110 MCG/ACT inhaler Inhale 2 puffs into the lungs daily. 07/04/16 01/19/20  Philip Aspen, Limmie Patricia, MD  mometasone-formoterol (DULERA) 100-5 MCG/ACT AERO Inhale 2 puffs into the lungs 2 (two) times daily. 09/10/16 01/19/20  Jacquelin Hawking, PA-C  sulfaSALAzine (AZULFIDINE) 500 MG tablet Take 500 mg by mouth 2 (two) times daily.   01/19/20  [provider]    Family History Family History  Problem Relation Age of Onset   Lung cancer Mother    Cancer Mother     Social History Social History   Tobacco Use   Smoking status: Never   Smokeless tobacco: Never  Vaping Use   Vaping Use: Never used  Substance Use Topics   Alcohol use: No   Drug use: Yes    Types: Benzodiazepines    Comment: on  suboxone     Allergies   Patient has no known allergies.   Review of Systems Review of Systems Per HPI  Physical Exam Triage Vital Signs ED Triage Vitals  Enc Vitals Group     BP 07/19/22 1741 124/76     Pulse Rate 07/19/22 1741 90     Resp 07/19/22 1741 16     Temp 07/19/22 1741 97.7 F (36.5 C)     Temp Source 07/19/22 1741 Oral     SpO2 07/19/22 1741 94 %     Weight --      Height --      Head Circumference --  Peak Flow --      Pain Score 07/19/22 1742 0     Pain Loc --      Pain Edu? --      Excl. in [place]? --    No data found.  Updated Vital Signs BP 124/76 (BP Location: Right Arm)   Pulse 90   Temp 97.7 F (36.5 C) (Oral)   Resp 16   LMP 04/28/2019   SpO2 94%   Visual Acuity Right Eye Distance:   Left Eye Distance:   Bilateral Distance:    Right Eye Near:   Left Eye Near:    Bilateral Near:     Physical Exam Vitals and nursing note reviewed.  Constitutional:      Appearance: Normal appearance. She is not ill-appearing.  HENT:     Head: Atraumatic.  Eyes:     Extraocular Movements: Extraocular movements intact.     Conjunctiva/sclera: Conjunctivae normal.  Cardiovascular:     Rate and Rhythm: Normal rate and regular rhythm.     Heart sounds: Normal heart sounds.  Pulmonary:     Effort: Pulmonary effort is normal.     Breath sounds: Normal breath sounds.  Musculoskeletal:        General: Normal range of motion.     Cervical back: Normal range of motion and neck supple.  Skin:    General: Skin is warm and dry.     Findings: Rash present.     Comments: Erythematous maculopapular rash diffusely across bilateral arms, legs, neck region  Neurological:     Mental Status: She is alert and oriented to person, place, and time.  Psychiatric:        Mood and Affect: Mood normal.        Thought Content: Thought content normal.        Judgment: Judgment normal.      UC Treatments / Results  Labs (all labs ordered are listed, but only  abnormal results are displayed) Labs Reviewed - No data to display  EKG   Radiology No results found.  Procedures Procedures (including critical care time)  Medications Ordered in UC Medications  methylPREDNISolone sodium succinate (SOLU-MEDROL) 125 mg/2 mL injection 80 mg (80 mg Intramuscular Given 07/19/22 1807)    Initial Impression / Assessment and Plan / UC Course  I have reviewed the triage vital signs and the nursing notes.  Pertinent labs & imaging results that were available during my care of the patient were reviewed by me and considered in my medical decision making (see chart for details).     Treat with IM Solu-Medrol per patient request, will also add a prednisone taper to ensure full resolution and Kenalog cream.  Return for worsening symptoms. Final Clinical Impressions(s) / UC Diagnoses   Final diagnoses:  Poison oak dermatitis   Discharge Instructions   None    ED Prescriptions     Medication Sig Dispense Auth. Provider   predniSONE (DELTASONE) 10 MG tablet Take 6 tabs day one, 5 tabs day two, 4 tabs day three, etc 21 tablet Volney American, PA-C   triamcinolone cream (KENALOG) 0.1 % Apply 1 Application topically 2 (two) times daily. 80 g Volney American, [place]      PDMP not reviewed this encounter.   Volney American, [place] 07/19/22 1827

## 2022-07-19 NOTE — ED Triage Notes (Signed)
Poison oak on neck, arms, and legs.  Was cleaning beside a building yesterday.

## 2022-07-30 DIAGNOSIS — I10 Essential (primary) hypertension: Secondary | ICD-10-CM | POA: Diagnosis not present

## 2022-07-30 DIAGNOSIS — J4540 Moderate persistent asthma, uncomplicated: Secondary | ICD-10-CM | POA: Diagnosis not present

## 2022-07-30 DIAGNOSIS — M1710 Unilateral primary osteoarthritis, unspecified knee: Secondary | ICD-10-CM | POA: Diagnosis not present

## 2022-07-30 DIAGNOSIS — L309 Dermatitis, unspecified: Secondary | ICD-10-CM | POA: Diagnosis not present

## 2022-07-30 DIAGNOSIS — Z6830 Body mass index (BMI) 30.0-30.9, adult: Secondary | ICD-10-CM | POA: Diagnosis not present

## 2022-07-30 DIAGNOSIS — M818 Other osteoporosis without current pathological fracture: Secondary | ICD-10-CM | POA: Diagnosis not present

## 2022-07-30 DIAGNOSIS — F3132 Bipolar disorder, current episode depressed, moderate: Secondary | ICD-10-CM | POA: Diagnosis not present

## 2022-08-19 ENCOUNTER — Ambulatory Visit: Payer: Medicaid Other | Admitting: Adult Health

## 2022-09-26 DIAGNOSIS — F3132 Bipolar disorder, current episode depressed, moderate: Secondary | ICD-10-CM | POA: Diagnosis not present

## 2022-09-26 DIAGNOSIS — I10 Essential (primary) hypertension: Secondary | ICD-10-CM | POA: Diagnosis not present

## 2022-09-26 DIAGNOSIS — M818 Other osteoporosis without current pathological fracture: Secondary | ICD-10-CM | POA: Diagnosis not present

## 2022-09-26 DIAGNOSIS — Z6828 Body mass index (BMI) 28.0-28.9, adult: Secondary | ICD-10-CM | POA: Diagnosis not present

## 2022-09-26 DIAGNOSIS — K5180 Other ulcerative colitis without complications: Secondary | ICD-10-CM | POA: Diagnosis not present

## 2022-09-27 ENCOUNTER — Encounter: Payer: Self-pay | Admitting: Gastroenterology

## 2022-09-30 ENCOUNTER — Ambulatory Visit: Payer: Medicaid Other | Admitting: Gastroenterology

## 2022-11-09 ENCOUNTER — Encounter: Payer: Self-pay | Admitting: Gastroenterology

## 2022-11-09 NOTE — Progress Notes (Deleted)
GI Office Note    Referring Provider: Neale Burly, MD Primary Care Physician:  Neale Burly, MD Primary Gastroenterologist: ***  Date:  11/09/2022  ID:  Melanie Mendoza, DOB [birth year], MRN [id number]   Chief Complaint   No chief complaint on file.   History of Present Illness  Melanie Mendoza is a 52 y.o. female with a history of PE off anticoagulation (possible thrombophilia), asthma, HTN, polysubstance abuse*** presenting today for follow up.  Last office visit 07/27/20. Patient reported she was diagnosed 22 years prior after having rectal bleeding and abdominal pain. Previously took prednisone intermittently previous (6-7 times). Reported last flare in 2017 while incarcerated. Stated she underwent colonoscopy then but records not available. Stated she was placed of sulfazalasine in 2017. Has recently had lower abdominal pressure worse on the left side. Reports daily BM or once every other day. Reported normal stools. Denied extraintestinal manifestations. Family hx of Chrons disease in cousin. No DEXA scan. Scheduled for colonoscopy, dexa scan, labs, advised to get pneumonia and flu vaccines. F/u in 6 months.   Last colonoscopy 08/15/20: - normal TI - normal colon - nonbleeding internal hemorrhoids - Advised to start Lialda 2.4g daily.   Per PCP referral paperwork 09/26/22 patient reported recent ulcerative colitis flare. Blood in stools and some nausea. Plan outlined as continue mesalamine and advised colonoscopy.   Labs 01/11/22: alk phos 123, LFTs normal otherwise, Cr 0.9, GFR 77.  Today:    Last flu shot:*** Last pneumonia shot:*** Last DEXA scan: *** COVID-19 shot: ***  EIM:    Current Outpatient Medications  Medication Sig Dispense Refill   albuterol (PROVENTIL) (2.5 MG/3ML) 0.083% nebulizer solution Take 3 mLs (2.5 mg total) by nebulization every 6 (six) hours as needed for wheezing or shortness of breath. (Patient taking differently: Take 2.5 mg by  nebulization daily.) 75 mL 1   albuterol (VENTOLIN HFA) 108 (90 Base) MCG/ACT inhaler Inhale 1-2 puffs into the lungs every 6 (six) hours as needed for wheezing or shortness of breath. 18 g 2   ALPRAZolam (XANAX) 1 MG tablet Take by mouth.     amLODipine (NORVASC) 2.5 MG tablet Take 2.5 mg by mouth daily.     amphetamine-dextroamphetamine (ADDERALL) 20 MG tablet Take 20 mg by mouth 2 (two) times daily.     buprenorphine (SUBUTEX) 8 MG SUBL SL tablet 8 mg 3 (three) times daily.     cetirizine (ZYRTEC) 10 MG tablet Take 10 mg by mouth daily.     cloNIDine (CATAPRES) 0.1 MG tablet Take 0.1 mg by mouth at bedtime.     furosemide (LASIX) 20 MG tablet Take 20 mg by mouth daily.     gabapentin (NEURONTIN) 300 MG capsule Take 100 mg by mouth daily.     mesalamine (LIALDA) 1.2 g EC tablet Take 2 tablets (2.4 g total) by mouth daily with breakfast. 60 tablet 5   Omega-3 1000 MG CAPS Take 1,000 mg by mouth daily.     PARoxetine (PAXIL) 20 MG tablet Take 1 tablet (20 mg total) by mouth daily. 90 tablet 3   predniSONE (DELTASONE) 10 MG tablet Take 6 tabs day one, 5 tabs day two, 4 tabs day three, etc 21 tablet 0   QUEtiapine (SEROQUEL) 100 MG tablet Take 50 mg by mouth at bedtime.     SYMBICORT 160-4.5 MCG/ACT inhaler Inhale 2 puffs into the lungs 2 (two) times daily.     triamcinolone cream (KENALOG) 0.1 % Apply 1 Application topically 2 (  two) times daily. 80 g 0   No current facility-administered medications for this visit.    Past Medical History:  Diagnosis Date   Acute pulmonary embolism ([place]) 09/13/2013   Asthma    Bilateral ovarian cysts    Hypertension    PE (pulmonary embolism) 09/2013   Polysubstance abuse ([place])    Prediabetes 07/01/2016   Ulcerative colitis ([name])     Past Surgical History:  Procedure Laterality Date   BIOPSY  08/15/2020   Procedure: BIOPSY;  Surgeon: Harvel Quale, MD;  [facility];  Service: Gastroenterology;;  colon   COLONOSCOPY WITH  PROPOFOL N/A 08/15/2020   Procedure: COLONOSCOPY WITH PROPOFOL;  Surgeon: Harvel Quale, MD;  [facility];  Service: Gastroenterology;  Laterality: N/A;  [place] N/A 10/23/2016   Procedure: APPENDECTOMY LAPAROSCOPIC;  Surgeon: Aviva Signs, MD;  Location: [facility];  Service: General;  Laterality: N/A;    Family History  Problem Relation Age of Onset   Lung cancer Mother    Cancer Mother     Allergies as of 11/11/2022   (No Known Allergies)    Social History   Socioeconomic History   Marital status: Significant Other    Spouse name: Not on file   Number of children: Not on file   Years of education: Not on file   Highest education level: Not on file  Occupational History   Not on file  Tobacco Use   Smoking status: Never   Smokeless tobacco: Never  Vaping Use   Vaping Use: Never used  Substance and Sexual Activity   Alcohol use: No   Drug use: Yes    Types: Benzodiazepines    Comment: on suboxone   Sexual activity: Yes    Birth control/protection: Post-menopausal  Other Topics Concern   Not on file  Social History Narrative   Not on file   Social Determinants of Health   Financial Resource Strain: Not on file  Food Insecurity: Not on file  Transportation Needs: Not on file  Physical Activity: Not on file  Stress: Not on file  Social Connections: Not on file     Review of Systems   Gen: Denies fever, chills, anorexia. Denies fatigue, weakness, weight loss.  CV: Denies chest pain, palpitations, syncope, peripheral edema, and claudication. Resp: Denies dyspnea at rest, cough, wheezing, coughing up blood, and pleurisy. GI: See HPI Derm: Denies rash, itching, dry skin Psych: Denies depression, anxiety, memory loss, confusion. No homicidal or suicidal ideation.  Heme: Denies bruising, bleeding, and enlarged lymph nodes.   Physical Exam   LMP 04/28/2019   General:   Alert and oriented. No distress noted.  Pleasant and cooperative.  Head:  Normocephalic and atraumatic. Eyes:  Conjuctiva clear without scleral icterus. Mouth:  Oral mucosa pink and moist. Good dentition. No lesions. Lungs:  Clear to auscultation bilaterally. No wheezes, rales, or rhonchi. No distress.  Heart:  S1, S2 present without murmurs appreciated.  Abdomen:  +BS, soft, non-tender and non-distended. No rebound or guarding. No HSM or masses noted. Rectal: *** Msk:  Symmetrical without gross deformities. Normal posture. Extremities:  Without edema. Neurologic:  Alert and  oriented x4 Psych:  Alert and cooperative. Normal mood and affect.   Assessment  Melanie Mendoza is a 52 y.o. female with a history of PE off anticoagulation (possible thrombophilia), asthma, HTN, polysubstance abuse*** presenting today for follow up.  Ulcerative colitis:   PLAN   ***  Venetia Night, MSN, FNP-BC, AGACNP-BC [facility]

## 2022-11-11 ENCOUNTER — Ambulatory Visit: Payer: Medicaid Other | Admitting: Gastroenterology

## 2022-11-11 ENCOUNTER — Encounter: Payer: Self-pay | Admitting: Gastroenterology

## 2022-11-22 NOTE — Progress Notes (Deleted)
Referring Provider: Neale Burly, MD Primary Care Physician:  Neale Burly, MD Primary GI Physician: Dr. Jenetta Downer  No chief complaint on file.   HPI:   Melanie Mendoza is a 52 y.o. female with history of ulcerative colitis presenting today for follow-up.    She was diagnosed with ulcerative colitis close to 24 years ago after presenting with rectal bleeding episodes and abdominal pain.   Last seen in the GI clinic in October 2021 reporting some lower abdominal pain for the last couple of months, primarily on the left side.  No diarrhea or rectal bleeding.  Planned to obtain baseline labs, DEXA, and proceed with colonoscopy.  She was found to have vitamin D deficiency.  Otherwise, labs look good including inflammatory markers. DEXA scan was consistent with osteoporosis.  Recommended starting vitamin D, calcium, fosamax, and follow-up with PCP.  Colonoscopy 08/15/2020: Normal examined ileum and colon s/p colon biopsies.  Also with nonbleeding internal hemorrhoids.  She is started on Lialda 2.4 g daily.  Her colon biopsies returned benign with minimal architectural changes.  Dr. Jenetta Downer stated he suspected she had distal colitis and recommended repeat colonoscopy in 5 years.  Today:    Last flu shot: Last pneumonia shot:    Colonoscopy in May 2014 at [facility] with severely erythematous, ulcerated, congested mucosa in the rectum biopsied.  Mild inflammation from 15-20 cm to 30 cm.  Otherwise normal exam.   Past Medical History:  Diagnosis Date   Acute pulmonary embolism ([place]) 09/13/2013   Asthma    Bilateral ovarian cysts    Hypertension    PE (pulmonary embolism) 09/2013   Polysubstance abuse ([place])    Prediabetes 07/01/2016   Ulcerative colitis ([place])     Past Surgical History:  Procedure Laterality Date   BIOPSY  08/15/2020   Procedure: BIOPSY;  Surgeon: Harvel Quale, MD;  [facility];  Service: Gastroenterology;;  colon    COLONOSCOPY WITH PROPOFOL N/A 08/15/2020   Procedure: COLONOSCOPY WITH PROPOFOL;  Surgeon: Harvel Quale, MD;  [facility];  Service: Gastroenterology;  Laterality: N/A;  [place] N/A 10/23/2016   Procedure: APPENDECTOMY LAPAROSCOPIC;  Surgeon: Aviva Signs, MD;  Location: [facility];  Service: General;  Laterality: N/A;    Current Outpatient Medications  Medication Sig Dispense Refill   albuterol (PROVENTIL) (2.5 MG/3ML) 0.083% nebulizer solution Take 3 mLs (2.5 mg total) by nebulization every 6 (six) hours as needed for wheezing or shortness of breath. (Patient taking differently: Take 2.5 mg by nebulization daily.) 75 mL 1   albuterol (VENTOLIN HFA) 108 (90 Base) MCG/ACT inhaler Inhale 1-2 puffs into the lungs every 6 (six) hours as needed for wheezing or shortness of breath. 18 g 2   ALPRAZolam (XANAX) 1 MG tablet Take by mouth.     amLODipine (NORVASC) 2.5 MG tablet Take 2.5 mg by mouth daily.     amphetamine-dextroamphetamine (ADDERALL) 20 MG tablet Take 20 mg by mouth 2 (two) times daily.     buprenorphine (SUBUTEX) 8 MG SUBL SL tablet 8 mg 3 (three) times daily.     cetirizine (ZYRTEC) 10 MG tablet Take 10 mg by mouth daily.     cloNIDine (CATAPRES) 0.1 MG tablet Take 0.1 mg by mouth at bedtime.     furosemide (LASIX) 20 MG tablet Take 20 mg by mouth daily.     gabapentin (NEURONTIN) 300 MG capsule Take 100 mg by mouth daily.  mesalamine (LIALDA) 1.2 g EC tablet Take 2 tablets (2.4 g total) by mouth daily with breakfast. 60 tablet 5   Omega-3 1000 MG CAPS Take 1,000 mg by mouth daily.     PARoxetine (PAXIL) 20 MG tablet Take 1 tablet (20 mg total) by mouth daily. 90 tablet 3   predniSONE (DELTASONE) 10 MG tablet Take 6 tabs day one, 5 tabs day two, 4 tabs day three, etc 21 tablet 0   QUEtiapine (SEROQUEL) 100 MG tablet Take 50 mg by mouth at bedtime.     SYMBICORT 160-4.5 MCG/ACT inhaler Inhale 2 puffs into the lungs 2 (two) times daily.      triamcinolone cream (KENALOG) 0.1 % Apply 1 Application topically 2 (two) times daily. 80 g 0   No current facility-administered medications for this visit.    Allergies as of 11/25/2022   (No Known Allergies)    Family History  Problem Relation Age of Onset   Lung cancer Mother    Cancer Mother     Social History   Socioeconomic History   Marital status: Significant Other    Spouse name: Not on file   Number of children: Not on file   Years of education: Not on file   Highest education level: Not on file  Occupational History   Not on file  Tobacco Use   Smoking status: Never   Smokeless tobacco: Never  Vaping Use   Vaping Use: Never used  Substance and Sexual Activity   Alcohol use: No   Drug use: Yes    Types: Benzodiazepines    Comment: on suboxone   Sexual activity: Yes    Birth control/protection: Post-menopausal  Other Topics Concern   Not on file  Social History Narrative   Not on file   Social Determinants of Health   Financial Resource Strain: Not on file  Food Insecurity: Not on file  Transportation Needs: Not on file  Physical Activity: Not on file  Stress: Not on file  Social Connections: Not on file    Review of Systems: Gen: Denies fever, chills, anorexia. Denies fatigue, weakness, weight loss.  CV: Denies chest pain, palpitations, syncope, peripheral edema, and claudication. Resp: Denies dyspnea at rest, cough, wheezing, coughing up blood, and pleurisy. GI: Denies vomiting blood, jaundice, and fecal incontinence.   Denies dysphagia or odynophagia. Derm: Denies rash, itching, dry skin Psych: Denies depression, anxiety, memory loss, confusion. No homicidal or suicidal ideation.  Heme: Denies bruising, bleeding, and enlarged lymph nodes.  Physical Exam: LMP 04/28/2019  General:   Alert and oriented. No distress noted. Pleasant and cooperative.  Head:  Normocephalic and atraumatic. Eyes:  Conjuctiva clear without scleral icterus. Heart:   S1, S2 present without murmurs appreciated. Lungs:  Clear to auscultation bilaterally. No wheezes, rales, or rhonchi. No distress.  Abdomen:  +BS, soft, non-tender and non-distended. No rebound or guarding. No HSM or masses noted. Msk:  Symmetrical without gross deformities. Normal posture. Extremities:  Without edema. Neurologic:  Alert and  oriented x4 Psych:  Normal mood and affect.    Assessment:     Plan:  ***   Aliene Altes, PA-C [facility] 11/25/2022

## 2022-11-25 ENCOUNTER — Ambulatory Visit: Payer: Medicaid Other | Admitting: Gastroenterology

## 2022-12-17 ENCOUNTER — Ambulatory Visit: Payer: Medicaid Other | Admitting: Adult Health

## 2023-01-10 ENCOUNTER — Encounter: Payer: Self-pay | Admitting: Emergency Medicine

## 2023-01-10 ENCOUNTER — Ambulatory Visit
Admission: EM | Admit: 2023-01-10 | Discharge: 2023-01-10 | Disposition: A | Discharge status: Home / Self Care | Payer: Medicaid Other

## 2023-01-10 ENCOUNTER — Other Ambulatory Visit: Payer: Self-pay

## 2023-01-10 DIAGNOSIS — R000 Tachycardia, unspecified: Secondary | ICD-10-CM | POA: Diagnosis not present

## 2023-01-10 DIAGNOSIS — K921 Melena: Secondary | ICD-10-CM

## 2023-01-10 DIAGNOSIS — R1030 Lower abdominal pain, unspecified: Secondary | ICD-10-CM | POA: Diagnosis not present

## 2023-01-10 DIAGNOSIS — K5190 Ulcerative colitis, unspecified, without complications: Secondary | ICD-10-CM | POA: Diagnosis not present

## 2023-01-10 MED ORDER — DEXAMETHASONE SODIUM PHOSPHATE 10 MG/ML IJ SOLN
10.0000 mg | Freq: Once | INTRAMUSCULAR | Status: AC
  Administered 2023-01-10: 10 mg via INTRAMUSCULAR

## 2023-01-10 NOTE — Discharge Instructions (Signed)
Will give you a steroid shot today as well as check labs with particular focus on your blood counts.  Will be in touch as soon as possible if anything is abnormal but you should also be able to view your results on MyChart as soon as they become available.  If your symptoms significantly worsen at any time you need to go to the emergency department immediately.

## 2023-01-10 NOTE — ED Triage Notes (Signed)
Pt reports "ulcerative colitis" is flaring up. Pt reports is waiting to have colonoscopy but reports abd pain and weakness are getting worse. Pt denies any known fevers but reports nausea,diarrhea with intermittent blood. Has taken pepto bismol with no improvement of symptoms.

## 2023-01-10 NOTE — ED Provider Notes (Addendum)
[facility]    CSN: [id number] Arrival date & time: 01/10/23  1535      History   Chief Complaint Chief Complaint  Patient presents with   Abdominal Pain    HPI Melanie Mendoza is a 52 y.o. female.   Presenting today with several week history of progressively worsening lower abdominal pain, bloody stools, nausea, diarrhea, weakness.  Denies fevers, vomiting, rashes, upper respiratory symptoms.  Was diagnosed with ulcerative colitis in her 51s and states this feels very consistent with an ulcerative colitis flareup.  She has been taking Pepto-Bismol with minimal relief thus far.  States she tried to call her GI doctor earlier in the week but did not get a call back.  She is awaiting a repeat colonoscopy, unsure when this will be scheduled.    Past Medical History:  Diagnosis Date   Acute pulmonary embolism ([place]) 09/13/2013   Asthma    Bilateral ovarian cysts    Hypertension    PE (pulmonary embolism) 09/2013   Polysubstance abuse ([place])    Prediabetes 07/01/2016   Ulcerative colitis [facility])     Patient Active Problem List   Diagnosis Date Noted   Dyspareunia in female 07/30/2021   Uterine tenderness 07/30/2021   BV (bacterial vaginosis) 01/02/2021   Vaginal discharge 01/02/2021   Vaginal odor 01/02/2021   Ulcerative colitis ([place]) 07/27/2020   Pelvic pressure in female 05/25/2020   Hot flashes 05/25/2020   Mood swings 05/25/2020   Screening for colorectal cancer 05/19/2019   Screening examination for STD (sexually transmitted disease) 05/19/2019   Well woman exam with routine gynecological exam 05/19/2019   Wheezing 05/19/2019   Mass of upper outer quadrant of left breast 05/19/2019   Peri-menopause 05/19/2019   Acute appendicitis 10/22/2016   Asthma exacerbation 07/03/2016   Hyperlipidemia 07/01/2016   Prediabetes 07/01/2016   Long term (current) use of anticoagulants [Z79.01] 05/08/2016   On anticoagulant therapy 04/16/2016   Obesity, unspecified  04/16/2016   Manipulative behavior 09/13/2013   Acute pulmonary embolism ([place]) 09/13/2013   Asthma with bronchitis 09/08/2013   Polysubstance abuse ([place]) 09/08/2013   Narcotic abuse ([place]) 09/08/2013   Hypoxia 09/08/2013   DVT of leg (deep venous thrombosis) ([place]) 09/08/2013   Anemia 09/08/2013   Overdose of benzodiazepine 08/20/2013   Acute encephalopathy 08/20/2013   Acute respiratory failure ([place]) 08/20/2013   Asthma 08/20/2013   Leukocytosis 08/20/2013    Past Surgical History:  Procedure Laterality Date   BIOPSY  08/15/2020   Procedure: BIOPSY;  Surgeon: Harvel Quale, MD;  [facility];  Service: Gastroenterology;;  colon   COLONOSCOPY WITH PROPOFOL N/A 08/15/2020   Procedure: COLONOSCOPY WITH PROPOFOL;  Surgeon: Harvel Quale, MD;  [facility];  Service: Gastroenterology;  Laterality: N/A;  [place] N/A 10/23/2016   Procedure: APPENDECTOMY LAPAROSCOPIC;  Surgeon: Aviva Signs, MD;  Location: [facility];  Service: General;  Laterality: N/A;    OB History     Gravida  2   Para  2   Term      Preterm      AB      Living  2      SAB      IAB      Ectopic      Multiple      Live Births               Home Medications    Prior to Admission medications  Medication Sig Start Date End Date Taking? Authorizing Provider  venlafaxine (EFFEXOR) 25 MG tablet Take 25 mg by mouth once.   Yes [provider]  albuterol (PROVENTIL) (2.5 MG/3ML) 0.083% nebulizer solution Take 3 mLs (2.5 mg total) by nebulization every 6 (six) hours as needed for wheezing or shortness of breath. Patient taking differently: Take 2.5 mg by nebulization daily. 01/19/20   Wurst, [place], PA-C  albuterol (VENTOLIN HFA) 108 (90 Base) MCG/ACT inhaler Inhale 1-2 puffs into the lungs every 6 (six) hours as needed for wheezing or shortness of breath. 01/19/20   Wurst, [place], PA-C  ALPRAZolam Duanne Moron) 1 MG tablet Take by  mouth. 07/25/21   [provider]  amLODipine (NORVASC) 2.5 MG tablet Take 2.5 mg by mouth daily. 09/08/20   [provider]  amphetamine-dextroamphetamine (ADDERALL) 20 MG tablet Take 20 mg by mouth 2 (two) times daily. 07/25/21   [provider]  buprenorphine (SUBUTEX) 8 MG SUBL SL tablet 8 mg 3 (three) times daily. 07/25/21   [provider]  cetirizine (ZYRTEC) 10 MG tablet Take 10 mg by mouth daily.    [provider]  cloNIDine (CATAPRES) 0.1 MG tablet Take 0.1 mg by mouth at bedtime.    [provider]  furosemide (LASIX) 20 MG tablet Take 20 mg by mouth as needed. 06/20/21   [provider]  gabapentin (NEURONTIN) 300 MG capsule Take 100 mg by mouth as needed. 06/11/21   [provider]  mesalamine (LIALDA) 1.2 g EC tablet Take 2 tablets (2.4 g total) by mouth daily with breakfast. 08/15/20   Montez Morita, Quillian Quince, MD  Omega-3 1000 MG CAPS Take 1,000 mg by mouth daily.    [provider]  PARoxetine (PAXIL) 20 MG tablet Take 1 tablet (20 mg total) by mouth daily. 03/20/22   Estill Dooms, NP  predniSONE (DELTASONE) 10 MG tablet Take 6 tabs day one, 5 tabs day two, 4 tabs day three, etc 07/19/22   Volney American, PA-C  QUEtiapine (SEROQUEL) 100 MG tablet Take 50 mg by mouth at bedtime.    [provider]  SYMBICORT 160-4.5 MCG/ACT inhaler Inhale 2 puffs into the lungs 2 (two) times daily. 06/20/21   [provider]  triamcinolone cream (KENALOG) 0.1 % Apply 1 Application topically 2 (two) times daily. 07/19/22   Volney American, PA-C  fluticasone (FLOVENT HFA) 110 MCG/ACT inhaler Inhale 2 puffs into the lungs daily. 07/04/16 01/19/20  Isaac Bliss, Rayford Halsted, MD  mometasone-formoterol (DULERA) 100-5 MCG/ACT AERO Inhale 2 puffs into the lungs 2 (two) times daily. 09/10/16 01/19/20  Soyla Dryer, PA-C  sulfaSALAzine (AZULFIDINE) 500 MG tablet Take 500 mg by mouth 2 (two) times  daily.   01/19/20  [provider]    Family History Family History  Problem Relation Age of Onset   Lung cancer Mother    Cancer Mother     Social History Social History   Tobacco Use   Smoking status: Never   Smokeless tobacco: Never  Vaping Use   Vaping Use: Never used  Substance Use Topics   Alcohol use: No   Drug use: Yes    Types: Benzodiazepines    Comment: on suboxone     Allergies   Patient has no known allergies.   Review of Systems Review of Systems Per HPI  Physical Exam Triage Vital Signs ED Triage Vitals  Enc Vitals Group     BP 01/10/23 1549 99/69     Pulse Rate  01/10/23 1549 (!) 115     Resp 01/10/23 1549 20     Temp 01/10/23 1549 (!) 97.5 F (36.4 C)     Temp Source 01/10/23 1549 Oral     SpO2 01/10/23 1549 95 %     Weight --      Height --      Head Circumference --      Peak Flow --      Pain Score 01/10/23 1546 5     Pain Loc --      Pain Edu? --      Excl. in [place]? --    No data found.  Updated Vital Signs BP 99/69 (BP Location: Right Arm)   Pulse (!) 115   Temp (!) 97.5 F (36.4 C) (Oral)   Resp 20   LMP 04/28/2019   SpO2 95%   Visual Acuity Right Eye Distance:   Left Eye Distance:   Bilateral Distance:    Right Eye Near:   Left Eye Near:    Bilateral Near:     Physical Exam Vitals and nursing note reviewed.  Constitutional:      Appearance: Normal appearance. She is not ill-appearing.  HENT:     Head: Atraumatic.     Mouth/Throat:     Mouth: Mucous membranes are moist.  Eyes:     Extraocular Movements: Extraocular movements intact.     Conjunctiva/sclera: Conjunctivae normal.  Cardiovascular:     Rate and Rhythm: Normal rate and regular rhythm.     Heart sounds: Normal heart sounds.  Pulmonary:     Effort: Pulmonary effort is normal.     Breath sounds: Normal breath sounds.  Abdominal:     General: Bowel sounds are normal. There is no distension.     Palpations: Abdomen is soft.     Tenderness:  There is abdominal tenderness. There is no right CVA tenderness, left CVA tenderness, guarding or rebound.  Musculoskeletal:        General: Normal range of motion.     Cervical back: Normal range of motion and neck supple.  Skin:    General: Skin is warm and dry.  Neurological:     Mental Status: She is alert and oriented to person, place, and time.  Psychiatric:        Mood and Affect: Mood normal.        Thought Content: Thought content normal.        Judgment: Judgment normal.      UC Treatments / Results  Labs (all labs ordered are listed, but only abnormal results are displayed) Labs Reviewed  CBC WITH DIFFERENTIAL/PLATELET  COMPREHENSIVE METABOLIC PANEL    EKG   Radiology No results found.  Procedures Procedures (including critical care time)  Medications Ordered in UC Medications  dexamethasone (DECADRON) injection 10 mg (10 mg Intramuscular Given 01/10/23 1642)    Initial Impression / Assessment and Plan / UC Course  I have reviewed the triage vital signs and the nursing notes.  Pertinent labs & imaging results that were available during my care of the patient were reviewed by me and considered in my medical decision making (see chart for details).     Mildly tachycardic in triage, otherwise vital signs at her baseline.  Discussed with patient that without CT scanner and stat labs available we are unable to fully assess the extent of her symptoms and to confirm that this is in fact an ulcerative colitis flare and not some other etiology but  she declines going to the emergency department for further resources at this time.  Will obtain labs to check safety on her blood counts, give IM Decadron for a possible UC flare and continue close home monitoring, bland foods, fluids.  Knows to go to the emergency department if symptoms worsen at any time.  Final Clinical Impressions(s) / UC Diagnoses   Final diagnoses:  Bloody stools  Lower abdominal pain  Ulcerative  colitis without complications, unspecified location [facility])  Tachycardia     Discharge Instructions      Will give you a steroid shot today as well as check labs with particular focus on your blood counts.  Will be in touch as soon as possible if anything is abnormal but you should also be able to view your results on MyChart as soon as they become available.  If your symptoms significantly worsen at any time you need to go to the emergency department immediately.    ED Prescriptions   None    PDMP not reviewed this encounter.   Volney American, PA-C 01/10/23 [place], [place], [place] 01/10/23 1724

## 2023-01-11 LAB — COMPREHENSIVE METABOLIC PANEL
ALT: 13 IU/L (ref 0–32)
AST: 16 IU/L (ref 0–40)
Albumin/Globulin Ratio: 1.5 (ref 1.2–2.2)
Albumin: 4.5 g/dL (ref 3.8–4.9)
Alkaline Phosphatase: 104 IU/L (ref 44–121)
BUN/Creatinine Ratio: 14 (ref 9–23)
BUN: 21 mg/dL (ref 6–24)
Bilirubin Total: 0.3 mg/dL (ref 0.0–1.2)
CO2: 25 mmol/L (ref 20–29)
Calcium: 10.5 mg/dL — ABNORMAL HIGH (ref 8.7–10.2)
Chloride: 89 mmol/L — ABNORMAL LOW (ref 96–106)
Creatinine, Ser: 1.45 mg/dL — ABNORMAL HIGH (ref 0.57–1.00)
Globulin, Total: 3 g/dL (ref 1.5–4.5)
Glucose: 332 mg/dL — ABNORMAL HIGH (ref 70–99)
Potassium: 3.9 mmol/L (ref 3.5–5.2)
Sodium: 134 mmol/L (ref 134–144)
Total Protein: 7.5 g/dL (ref 6.0–8.5)
eGFR: 43 mL/min/{1.73_m2} — ABNORMAL LOW (ref >59)

## 2023-01-11 LAB — CBC WITH DIFFERENTIAL/PLATELET
Basophils Absolute: 0.1 10*3/uL (ref 0.0–0.2)
Basos: 1 %
EOS (ABSOLUTE): 0.1 10*3/uL (ref 0.0–0.4)
Eos: 1 %
Hematocrit: 44.6 % (ref 34.0–46.6)
Hemoglobin: 15.3 g/dL (ref 11.1–15.9)
Immature Grans (Abs): 0 10*3/uL (ref 0.0–0.1)
Immature Granulocytes: 0 %
Lymphocytes Absolute: 2.1 10*3/uL (ref 0.7–3.1)
Lymphs: 14 %
MCH: 31.1 pg (ref 26.6–33.0)
MCHC: 34.3 g/dL (ref 31.5–35.7)
MCV: 91 fL (ref 79–97)
Monocytes Absolute: 0.9 10*3/uL (ref 0.1–0.9)
Monocytes: 6 %
Neutrophils Absolute: 11.1 10*3/uL — ABNORMAL HIGH (ref 1.4–7.0)
Neutrophils: 78 %
Platelets: 683 10*3/uL — ABNORMAL HIGH (ref 150–450)
RBC: 4.92 x10E6/uL (ref 3.77–5.28)
RDW: 12 % (ref 11.7–15.4)
WBC: 14.3 10*3/uL — ABNORMAL HIGH (ref 3.4–10.8)

## 2023-01-30 DIAGNOSIS — M818 Other osteoporosis without current pathological fracture: Secondary | ICD-10-CM | POA: Diagnosis not present

## 2023-01-30 DIAGNOSIS — Z6828 Body mass index (BMI) 28.0-28.9, adult: Secondary | ICD-10-CM | POA: Diagnosis not present

## 2023-01-30 DIAGNOSIS — I10 Essential (primary) hypertension: Secondary | ICD-10-CM | POA: Diagnosis not present

## 2023-01-30 DIAGNOSIS — F3132 Bipolar disorder, current episode depressed, moderate: Secondary | ICD-10-CM | POA: Diagnosis not present

## 2023-01-30 DIAGNOSIS — K5180 Other ulcerative colitis without complications: Secondary | ICD-10-CM | POA: Diagnosis not present

## 2023-02-12 ENCOUNTER — Ambulatory Visit: Payer: Medicaid Other | Admitting: Adult Health

## 2023-04-28 DIAGNOSIS — Z0000 Encounter for general adult medical examination without abnormal findings: Secondary | ICD-10-CM | POA: Diagnosis not present

## 2023-04-28 DIAGNOSIS — Z6827 Body mass index (BMI) 27.0-27.9, adult: Secondary | ICD-10-CM | POA: Diagnosis not present

## 2023-04-28 DIAGNOSIS — M818 Other osteoporosis without current pathological fracture: Secondary | ICD-10-CM | POA: Diagnosis not present

## 2023-04-28 DIAGNOSIS — K5180 Other ulcerative colitis without complications: Secondary | ICD-10-CM | POA: Diagnosis not present

## 2023-04-28 DIAGNOSIS — F3132 Bipolar disorder, current episode depressed, moderate: Secondary | ICD-10-CM | POA: Diagnosis not present

## 2023-04-28 DIAGNOSIS — I10 Essential (primary) hypertension: Secondary | ICD-10-CM | POA: Diagnosis not present

## 2023-05-21 DIAGNOSIS — M818 Other osteoporosis without current pathological fracture: Secondary | ICD-10-CM | POA: Diagnosis not present

## 2023-05-21 DIAGNOSIS — G5600 Carpal tunnel syndrome, unspecified upper limb: Secondary | ICD-10-CM | POA: Diagnosis not present

## 2023-05-21 DIAGNOSIS — G8929 Other chronic pain: Secondary | ICD-10-CM | POA: Diagnosis not present

## 2023-05-21 DIAGNOSIS — F3132 Bipolar disorder, current episode depressed, moderate: Secondary | ICD-10-CM | POA: Diagnosis not present

## 2023-05-21 DIAGNOSIS — H814 Vertigo of central origin: Secondary | ICD-10-CM | POA: Diagnosis not present

## 2023-05-21 DIAGNOSIS — Z6827 Body mass index (BMI) 27.0-27.9, adult: Secondary | ICD-10-CM | POA: Diagnosis not present

## 2023-05-21 DIAGNOSIS — I10 Essential (primary) hypertension: Secondary | ICD-10-CM | POA: Diagnosis not present

## 2023-07-17 ENCOUNTER — Other Ambulatory Visit (HOSPITAL_COMMUNITY): Payer: Self-pay | Admitting: Respiratory Therapy

## 2023-07-17 DIAGNOSIS — J4520 Mild intermittent asthma, uncomplicated: Secondary | ICD-10-CM

## 2023-07-21 ENCOUNTER — Ambulatory Visit
Admission: RE | Admit: 2023-07-21 | Discharge: 2023-07-21 | Disposition: A | Discharge status: Home / Self Care | Payer: Medicaid Other | Source: Ambulatory Visit | Attending: Nurse Practitioner | Admitting: Nurse Practitioner

## 2023-07-21 VITALS — BP 131/83 | HR 97 | Temp 98.4°F | Resp 18

## 2023-07-21 DIAGNOSIS — R21 Rash and other nonspecific skin eruption: Secondary | ICD-10-CM | POA: Diagnosis not present

## 2023-07-21 MED ORDER — TRIAMCINOLONE ACETONIDE 0.1 % EX CREA: 1.0000 | TOPICAL_CREAM | Freq: Two times a day (BID) | CUTANEOUS | 0 refills | Status: AC

## 2023-07-21 MED ORDER — PREDNISONE 20 MG PO TABS: 40.0000 mg | ORAL_TABLET | Freq: Every day | ORAL | 0 refills | Status: AC

## 2023-07-21 MED ORDER — DEXAMETHASONE SODIUM PHOSPHATE 10 MG/ML IJ SOLN
10.0000 mg | INTRAMUSCULAR | Status: AC
  Administered 2023-07-21: 10 mg via INTRAMUSCULAR

## 2023-07-21 NOTE — Discharge Instructions (Signed)
Take medication as prescribed. May also take over-the-counter Zyrtec during the daytime and Benadryl at bedtime to help with itching. Avoid hot baths or showers while symptoms persist.  Recommend taking lukewarm baths. May apply cool cloths to the area to help with itching or discomfort. Avoid scratching, rubbing, or manipulating the areas while symptoms persist. Recommend Aveeno Colloidal Oatmeal Bath to use to help with drying and itching. If symptoms fail to improve with this treatment, you may follow-up in this clinic or with your primary care physician for further evaluation. Follow-up as needed.

## 2023-07-21 NOTE — ED Provider Notes (Signed)
[facility]    CSN: [id number] Arrival date & time: 07/21/23  1756      History   Chief Complaint Chief Complaint  Patient presents with   Rash    HPI Melanie Mendoza is a 52 y.o. female.   The history is provided by the patient.   Patient presents for complaints of rash that is been present for the past several days.  Patient states she was out in the garden cleaning weeds, and subsequently thereafter, noticed the rash.  Patient states that the rash has begun to spread over her body.  She states the rash is itchy.  She denies fever, chills, pain to the rash, oozing, drainage, or exposure to new soaps, medications, lotions, or foods.  Patient reports she has not tried any medication for her symptoms.  Denies history of diabetes.  Past Medical History:  Diagnosis Date   Acute pulmonary embolism (HCC) 09/13/2013   Asthma    Bilateral ovarian cysts    Hypertension    PE (pulmonary embolism) 09/2013   Polysubstance abuse (HCC)    Prediabetes 07/01/2016   Ulcerative colitis [facility])     Patient Active Problem List   Diagnosis Date Noted   Dyspareunia in female 07/30/2021   Uterine tenderness 07/30/2021   BV (bacterial vaginosis) 01/02/2021   Vaginal discharge 01/02/2021   Vaginal odor 01/02/2021   Ulcerative colitis (HCC) 07/27/2020   Pelvic pressure in female 05/25/2020   Hot flashes 05/25/2020   Mood swings 05/25/2020   Screening for colorectal cancer 05/19/2019   Screening examination for STD (sexually transmitted disease) 05/19/2019   Well woman exam with routine gynecological exam 05/19/2019   Wheezing 05/19/2019   Mass of upper outer quadrant of left breast 05/19/2019   Peri-menopause 05/19/2019   Acute appendicitis 10/22/2016   Asthma exacerbation 07/03/2016   Hyperlipidemia 07/01/2016   Prediabetes 07/01/2016   Long term (current) use of anticoagulants [Z79.01] 05/08/2016   On anticoagulant therapy 04/16/2016   Obesity, unspecified 04/16/2016    Manipulative behavior 09/13/2013   Acute pulmonary embolism (HCC) 09/13/2013   Asthma with bronchitis 09/08/2013   Polysubstance abuse (HCC) 09/08/2013   Narcotic abuse (HCC) 09/08/2013   Hypoxia 09/08/2013   DVT of leg (deep venous thrombosis) (HCC) 09/08/2013   Anemia 09/08/2013   Overdose of benzodiazepine 08/20/2013   Acute encephalopathy 08/20/2013   Acute respiratory failure (HCC) 08/20/2013   Asthma 08/20/2013   Leukocytosis 08/20/2013    Past Surgical History:  Procedure Laterality Date   BIOPSY  08/15/2020   Procedure: BIOPSY;  Surgeon: Dolores Frame, MD;  Location: [facility];  Service: Gastroenterology;;  colon   COLONOSCOPY WITH PROPOFOL N/A 08/15/2020   Procedure: COLONOSCOPY WITH PROPOFOL;  Surgeon: Dolores Frame, MD;  Location: [facility];  Service: Gastroenterology;  Laterality: N/A;  1030   LAPAROSCOPIC APPENDECTOMY N/A 10/23/2016   Procedure: APPENDECTOMY LAPAROSCOPIC;  Surgeon: Franky Macho, MD;  Location: [facility];  Service: General;  Laterality: N/A;    OB History     Gravida  2   Para  2   Term      Preterm      AB      Living  2      SAB      IAB      Ectopic      Multiple      Live Births               Home Medications  Prior to Admission medications   Medication Sig Start Date End Date Taking? Authorizing Provider  amLODipine (NORVASC) 2.5 MG tablet Take 2.5 mg by mouth daily. 09/08/20  Yes [provider]  buprenorphine (SUBUTEX) 8 MG SUBL SL tablet 8 mg 3 (three) times daily. 07/25/21  Yes [provider]  cetirizine (ZYRTEC) 10 MG tablet Take 10 mg by mouth daily.   Yes [provider]  gabapentin (NEURONTIN) 300 MG capsule Take 100 mg by mouth as needed. 06/11/21  Yes [provider]  Omega-3 1000 MG CAPS Take 1,000 mg by mouth daily.   Yes [provider]  QUEtiapine (SEROQUEL) 100 MG tablet Take 50 mg by mouth at bedtime.   Yes [provider]  venlafaxine (EFFEXOR) 25 MG tablet Take 25 mg by mouth once.   Yes [provider]  albuterol (PROVENTIL) (2.5 MG/3ML) 0.083% nebulizer solution Take 3 mLs (2.5 mg total) by nebulization every 6 (six) hours as needed for wheezing or shortness of breath. Patient taking differently: Take 2.5 mg by nebulization daily. 01/19/20   Wurst, [place], PA-C  albuterol (VENTOLIN HFA) 108 (90 Base) MCG/ACT inhaler Inhale 1-2 puffs into the lungs every 6 (six) hours as needed for wheezing or shortness of breath. 01/19/20   Wurst, [place], PA-C  ALPRAZolam Prudy Feeler) 1 MG tablet Take by mouth. 07/25/21   [provider]  amphetamine-dextroamphetamine (ADDERALL) 20 MG tablet Take 20 mg by mouth 2 (two) times daily. 07/25/21   [provider]  cloNIDine (CATAPRES) 0.1 MG tablet Take 0.1 mg by mouth at bedtime.    [provider]  furosemide (LASIX) 20 MG tablet Take 20 mg by mouth as needed. 06/20/21   [provider]  mesalamine (LIALDA) 1.2 g EC tablet Take 2 tablets (2.4 g total) by mouth daily with breakfast. 08/15/20   Marguerita Merles, Reuel Boom, MD  PARoxetine (PAXIL) 20 MG tablet Take 1 tablet (20 mg total) by mouth daily. 03/20/22   Adline Potter, NP  predniSONE (DELTASONE) 10 MG tablet Take 6 tabs day one, 5 tabs day two, 4 tabs day three, etc 07/19/22   Particia Nearing, PA-C  SYMBICORT 160-4.5 MCG/ACT inhaler Inhale 2 puffs into the lungs 2 (two) times daily. 06/20/21   [provider]  triamcinolone cream (KENALOG) 0.1 % Apply 1 Application topically 2 (two) times daily. 07/19/22   Particia Nearing, PA-C  fluticasone (FLOVENT HFA) 110 MCG/ACT inhaler Inhale 2 puffs into the lungs daily. 07/04/16 01/19/20  Philip Aspen, Limmie Patricia, MD  mometasone-formoterol (DULERA) 100-5 MCG/ACT AERO Inhale 2 puffs into the lungs 2 (two) times daily. 09/10/16 01/19/20  Jacquelin Hawking, PA-C  sulfaSALAzine (AZULFIDINE) 500 MG tablet Take 500 mg by mouth 2 (two)  times daily.   01/19/20  [provider]    Family History Family History  Problem Relation Age of Onset   Lung cancer Mother    Cancer Mother     Social History Social History   Tobacco Use   Smoking status: Never   Smokeless tobacco: Never  Vaping Use   Vaping status: Never Used  Substance Use Topics   Alcohol use: No   Drug use: Yes    Types: Benzodiazepines    Comment: on suboxone     Allergies   Patient has no known allergies.   Review of Systems Review of Systems Per HPI  Physical Exam Triage Vital Signs ED Triage Vitals  Encounter Vitals Group     BP 07/21/23 1805 131/83  Systolic BP Percentile --      Diastolic BP Percentile --      Pulse Rate 07/21/23 1805 97     Resp 07/21/23 1805 18     Temp 07/21/23 1805 98.4 F (36.9 C)     Temp Source 07/21/23 1805 Oral     SpO2 07/21/23 1805 96 %     Weight --      Height --      Head Circumference --      Peak Flow --      Pain Score 07/21/23 1806 0     Pain Loc --      Pain Education --      Exclude from Growth Chart --    No data found.  Updated Vital Signs BP 131/83 (BP Location: Right Arm)   Pulse 97   Temp 98.4 F (36.9 C) (Oral)   Resp 18   LMP 04/28/2019   SpO2 96%   Visual Acuity Right Eye Distance:   Left Eye Distance:   Bilateral Distance:    Right Eye Near:   Left Eye Near:    Bilateral Near:     Physical Exam Vitals and nursing note reviewed.  Constitutional:      General: She is not in acute distress.    Appearance: Normal appearance.  HENT:     Head: Normocephalic.  Eyes:     Extraocular Movements: Extraocular movements intact.     Pupils: Pupils are equal, round, and reactive to light.  Cardiovascular:     Rate and Rhythm: Normal rate and regular rhythm.     Pulses: Normal pulses.     Heart sounds: Normal heart sounds.  Pulmonary:     Effort: Pulmonary effort is normal.     Breath sounds: Normal breath sounds.  Abdominal:     General: Bowel sounds  are normal.     Palpations: Abdomen is soft.     Tenderness: There is no abdominal tenderness.  Musculoskeletal:     Cervical back: Normal range of motion.  Lymphadenopathy:     Cervical: No cervical adenopathy.  Skin:    General: Skin is warm and dry.     Findings: Rash present. Rash is macular and papular.     Comments: Maculopapular rash noted to the generalized body surface area.  Areas appear in clusters, on some areas are linear in appearance.  The areas are raised.  There is no congruent pattern.  There is no fluctuance, oozing, or drainage present.  Neurological:     General: No focal deficit present.     Mental Status: She is alert and oriented to person, place, and time.  Psychiatric:        Mood and Affect: Mood normal.        Behavior: Behavior normal.      UC Treatments / Results  Labs (all labs ordered are listed, but only abnormal results are displayed) Labs Reviewed - No data to display  EKG   Radiology No results found.  Procedures Procedures (including critical care time)  Medications Ordered in UC Medications - No data to display  Initial Impression / Assessment and Plan / UC Course  I have reviewed the triage vital signs and the nursing notes.  Pertinent labs & imaging results that were available during my care of the patient were reviewed by me and considered in my medical decision making (see chart for details).  Patient is well-appearing, she is in no acute distress, vital signs  are stable.  Patient presents with rash consistent with poison ivy dermatitis.  Decadron 10 mg IM administered.  Will start patient on prednisone 40 mg for the next 5 days, and triamcinolone cream 0.1% to apply topically to the affected areas.  Supportive care recommendations were provided and discussed with the patient to include over-the-counter antihistamines, cool compresses for itching, and use of Aveeno colloidal oatmeal bath.  Patient advised to follow-up in this  clinic or with her PCP if symptoms fail to improve.  Patient is in agreement with this plan of care and verbalizes understanding.  All questions were answered.  Patient stable for discharge.   Final Clinical Impressions(s) / UC Diagnoses   Final diagnoses:  None   Discharge Instructions   None    ED Prescriptions   None    PDMP not reviewed this encounter.   Abran Cantor, NP 07/21/23 1818

## 2023-07-21 NOTE — ED Triage Notes (Signed)
Pt presents with itchy small bumps/rash all over body that started Friday. Pt states she thinks it is poison oak.

## 2023-08-06 DIAGNOSIS — G5600 Carpal tunnel syndrome, unspecified upper limb: Secondary | ICD-10-CM | POA: Diagnosis not present

## 2023-08-06 DIAGNOSIS — G8929 Other chronic pain: Secondary | ICD-10-CM | POA: Diagnosis not present

## 2023-08-06 DIAGNOSIS — F3132 Bipolar disorder, current episode depressed, moderate: Secondary | ICD-10-CM | POA: Diagnosis not present

## 2023-08-06 DIAGNOSIS — M818 Other osteoporosis without current pathological fracture: Secondary | ICD-10-CM | POA: Diagnosis not present

## 2023-08-06 DIAGNOSIS — H814 Vertigo of central origin: Secondary | ICD-10-CM | POA: Diagnosis not present

## 2023-08-06 DIAGNOSIS — I10 Essential (primary) hypertension: Secondary | ICD-10-CM | POA: Diagnosis not present

## 2023-08-06 DIAGNOSIS — Z6827 Body mass index (BMI) 27.0-27.9, adult: Secondary | ICD-10-CM | POA: Diagnosis not present

## 2023-09-25 DIAGNOSIS — M5418 Radiculopathy, sacral and sacrococcygeal region: Secondary | ICD-10-CM | POA: Diagnosis not present

## 2023-09-25 DIAGNOSIS — Z6827 Body mass index (BMI) 27.0-27.9, adult: Secondary | ICD-10-CM | POA: Diagnosis not present

## 2023-10-28 ENCOUNTER — Emergency Department (HOSPITAL_COMMUNITY): Payer: Medicaid Other

## 2023-10-28 ENCOUNTER — Other Ambulatory Visit: Payer: Self-pay

## 2023-10-28 ENCOUNTER — Encounter (HOSPITAL_COMMUNITY): Payer: Self-pay | Admitting: Emergency Medicine

## 2023-10-28 ENCOUNTER — Emergency Department (HOSPITAL_COMMUNITY)
Admission: EM | Admit: 2023-10-28 | Discharge: 2023-10-29 | Disposition: A | Discharge status: Home / Self Care | Payer: Medicaid Other | Attending: Emergency Medicine | Admitting: Emergency Medicine

## 2023-10-28 DIAGNOSIS — R569 Unspecified convulsions: Secondary | ICD-10-CM | POA: Diagnosis not present

## 2023-10-28 DIAGNOSIS — R0689 Other abnormalities of breathing: Secondary | ICD-10-CM | POA: Diagnosis not present

## 2023-10-28 DIAGNOSIS — R000 Tachycardia, unspecified: Secondary | ICD-10-CM | POA: Diagnosis not present

## 2023-10-28 DIAGNOSIS — S29002A Unspecified injury of muscle and tendon of back wall of thorax, initial encounter: Secondary | ICD-10-CM | POA: Diagnosis not present

## 2023-10-28 DIAGNOSIS — M129 Arthropathy, unspecified: Secondary | ICD-10-CM | POA: Diagnosis not present

## 2023-10-28 DIAGNOSIS — M545 Low back pain, unspecified: Secondary | ICD-10-CM | POA: Insufficient documentation

## 2023-10-28 DIAGNOSIS — Z7982 Long term (current) use of aspirin: Secondary | ICD-10-CM | POA: Insufficient documentation

## 2023-10-28 LAB — URINALYSIS, W/ REFLEX TO CULTURE (INFECTION SUSPECTED)
Bilirubin Urine: NEGATIVE
Glucose, UA: 50 mg/dL — AB
Hgb urine dipstick: NEGATIVE
Ketones, ur: NEGATIVE mg/dL
Nitrite: NEGATIVE
Protein, ur: 30 mg/dL — AB
Specific Gravity, Urine: 1.026 (ref 1.005–1.030)
pH: 6 (ref 5.0–8.0)

## 2023-10-28 LAB — RAPID URINE DRUG SCREEN, HOSP PERFORMED
Amphetamines: NOT DETECTED
Barbiturates: NOT DETECTED
Benzodiazepines: POSITIVE — AB
Cocaine: NOT DETECTED
Opiates: NOT DETECTED
Tetrahydrocannabinol: POSITIVE — AB

## 2023-10-28 LAB — BASIC METABOLIC PANEL
Anion gap: 10 (ref 5–15)
BUN: 17 mg/dL (ref 6–20)
CO2: 28 mmol/L (ref 22–32)
Calcium: 9.4 mg/dL (ref 8.9–10.3)
Chloride: 96 mmol/L — ABNORMAL LOW (ref 98–111)
Creatinine, Ser: 0.57 mg/dL (ref 0.44–1.00)
GFR, Estimated: >60 mL/min (ref >60)
Glucose, Bld: 182 mg/dL — ABNORMAL HIGH (ref 70–99)
Potassium: 3.6 mmol/L (ref 3.5–5.1)
Sodium: 134 mmol/L — ABNORMAL LOW (ref 135–145)

## 2023-10-28 LAB — CBC
HCT: 43.4 % (ref 36.0–46.0)
Hemoglobin: 14.2 g/dL (ref 12.0–15.0)
MCH: 28.2 pg (ref 26.0–34.0)
MCHC: 32.7 g/dL (ref 30.0–36.0)
MCV: 86.3 fL (ref 80.0–100.0)
Platelets: 524 10*3/uL — ABNORMAL HIGH (ref 150–400)
RBC: 5.03 MIL/uL (ref 3.87–5.11)
RDW: 12.8 % (ref 11.5–15.5)
WBC: 6.8 10*3/uL (ref 4.0–10.5)
nRBC: 0 % (ref 0.0–0.2)

## 2023-10-28 LAB — CBG MONITORING, ED: Glucose-Capillary: 187 mg/dL — ABNORMAL HIGH (ref 70–99)

## 2023-10-28 LAB — MAGNESIUM: Magnesium: 2 mg/dL (ref 1.7–2.4)

## 2023-10-28 LAB — ETHANOL: Alcohol, Ethyl (B): <10 mg/dL (ref <10)

## 2023-10-28 MED ORDER — DIVALPROEX SODIUM 500 MG PO DR TAB: 500.0000 mg | DELAYED_RELEASE_TABLET | Freq: Two times a day (BID) | ORAL | 1 refills | Status: DC

## 2023-10-28 MED ORDER — VALPROATE SODIUM 100 MG/ML IV SOLN
1000.0000 mg | Freq: Once | INTRAVENOUS | Status: AC
  Administered 2023-10-28: 1000 mg via INTRAVENOUS
  Filled 2023-10-28: qty 10

## 2023-10-28 MED ORDER — ASPIRIN 81 MG PO CHEW: 81.0000 mg | CHEWABLE_TABLET | Freq: Every day | ORAL | 1 refills | Status: AC

## 2023-10-28 NOTE — ED Triage Notes (Signed)
 Pt had ? Seizure activity witnessed by boyfriend. Ems states pt was very confused when they arrived. Pt c/o right buttocks pain.

## 2023-10-28 NOTE — Discharge Instructions (Signed)
 It is important to follow-up with neurology for both this seizure as well as the old stroke seen on your CT scan.  We are putting you on antiseizure medicine as well as a baby aspirin .  - According to [place] law, you can not drive unless you are seizure / syncope free for at least 6 months and under physician's care.    - Please maintain precautions. Do not participate in activities where a loss of awareness could harm you or someone else. No swimming alone, no tub bathing, no hot tubs, no driving, no operating motorized vehicles (cars, ATVs, motocycles, etc), lawnmowers, power tools or firearms. No standing at heights, such as rooftops, ladders or stairs. Avoid hot objects such as stoves, heaters, open fires. Wear a helmet when riding a bicycle, scooter, skateboard, etc. and avoid areas of traffic. Set your water heater to 120 degrees or less.

## 2023-10-28 NOTE — ED Provider Notes (Signed)
 [place] EMERGENCY DEPARTMENT AT [facility] Provider Note   CSN: [id number] Arrival date & time: 10/28/23  1953     History  Chief Complaint  Patient presents with   Seizures    Melanie Mendoza is a 53 y.o. female.  HPI 53 year old female presents with a seizure.  History is from husband, who is currently separating from the patient.  He heard what sounded like a fall and then found the patient seizing for 5-10 minutes.  She has never had this before.  Sounds generalized tonic-clonic from his description.  Patient remembers feeling like her jaw was locking up and then woke up in the ambulance.  She denies any preceding or current headache.  She denies feeling ill recently.  She does endorse getting Xanax  off the street over the last 6-7 weeks.  She is taking this to deal with the stress of her separation.  She was taking 2-3 mg of Xanax  per day though over the last week or so she tapered herself off to 0 though she cannot give me an exact regimen.  She thinks her last dose of xanax  was about 3-4 days ago. She does not feel tremulous or shaky.  She denies any significant EtOH use.  Home Medications Prior to Admission medications   Medication Sig Start Date End Date Taking? Authorizing Provider  aspirin  81 MG chewable tablet Chew 1 tablet (81 mg total) by mouth daily. 10/28/23  Yes Freddi Hamilton, MD  divalproex  (DEPAKOTE ) 500 MG DR tablet Take 1 tablet (500 mg total) by mouth 2 (two) times daily. 10/28/23  Yes Freddi Hamilton, MD  albuterol  (PROVENTIL ) (2.5 MG/3ML) 0.083% nebulizer solution Take 3 mLs (2.5 mg total) by nebulization every 6 (six) hours as needed for wheezing or shortness of breath. Patient taking differently: Take 2.5 mg by nebulization daily. 01/19/20   Wurst, Brittany, PA-C  albuterol  (VENTOLIN  HFA) 108 (90 Base) MCG/ACT inhaler Inhale 1-2 puffs into the lungs every 6 (six) hours as needed for wheezing or shortness of breath. 01/19/20   Wurst, Brittany, PA-C   ALPRAZolam  (XANAX ) 1 MG tablet Take by mouth. 07/25/21   [provider]  amLODipine (NORVASC) 2.5 MG tablet Take 2.5 mg by mouth daily. 09/08/20   [provider]  amphetamine-dextroamphetamine (ADDERALL) 20 MG tablet Take 20 mg by mouth 2 (two) times daily. 07/25/21   [provider]  buprenorphine (SUBUTEX) 8 MG SUBL SL tablet 8 mg 3 (three) times daily. 07/25/21   [provider]  cetirizine (ZYRTEC) 10 MG tablet Take 10 mg by mouth daily.    [provider]  cloNIDine (CATAPRES) 0.1 MG tablet Take 0.1 mg by mouth at bedtime.    [provider]  furosemide (LASIX) 20 MG tablet Take 20 mg by mouth as needed. 06/20/21   [provider]  gabapentin  (NEURONTIN ) 300 MG capsule Take 100 mg by mouth as needed. 06/11/21   [provider]  mesalamine  (LIALDA ) 1.2 g EC tablet Take 2 tablets (2.4 g total) by mouth daily with breakfast. 08/15/20   Eartha Flavors, [place], MD  Omega-3 1000 MG CAPS Take 1,000 mg by mouth daily.    [provider]  PARoxetine  (PAXIL ) 20 MG tablet Take 1 tablet (20 mg total) by mouth daily. 03/20/22   Signa Delon LABOR, NP  QUEtiapine  (SEROQUEL ) 100 MG tablet Take 50 mg by mouth at bedtime.    [provider]  SYMBICORT 160-4.5 MCG/ACT inhaler Inhale 2 puffs into the lungs 2 (two)  times daily. 06/20/21   [provider]  triamcinolone  cream (KENALOG ) 0.1 % Apply 1 Application topically 2 (two) times daily. 07/21/23   Leath-Warren, Etta PARAS, NP  venlafaxine  (EFFEXOR ) 25 MG tablet Take 25 mg by mouth once.    [provider]  fluticasone  (FLOVENT  HFA) 110 MCG/ACT inhaler Inhale 2 puffs into the lungs daily. 07/04/16 01/19/20  Theophilus Andrews, Tully GRADE, MD  mometasone -formoterol  (DULERA) 100-5 MCG/ACT AERO Inhale 2 puffs into the lungs 2 (two) times daily. 09/10/16 01/19/20  Comer Kirsch, PA-C  sulfaSALAzine  (AZULFIDINE ) 500 MG tablet Take 500 mg by mouth 2 (two) times daily.    01/19/20  [provider]      Allergies    Patient has no known allergies.    Review of Systems   Review of Systems  Neurological:  Positive for seizures. Negative for weakness, numbness and headaches.    Physical Exam Updated Vital Signs BP 124/77   Pulse 83   Temp 98.4 F (36.9 C)   Resp 10   Ht 5' 7 (1.702 m)   Wt 93 kg   LMP 04/28/2019   SpO2 95%   BMI 32.11 kg/m  Physical Exam Vitals and nursing note reviewed.  Constitutional:      General: She is not in acute distress.    Appearance: She is well-developed. She is not ill-appearing or diaphoretic.  HENT:     Head: Normocephalic and atraumatic.     Mouth/Throat:     Comments: Small right lateral tongue abrasion Eyes:     Extraocular Movements: Extraocular movements intact.  Cardiovascular:     Rate and Rhythm: Normal rate and regular rhythm.     Heart sounds: Normal heart sounds.  Pulmonary:     Effort: Pulmonary effort is normal.     Breath sounds: Normal breath sounds.  Abdominal:     Palpations: Abdomen is soft.     Tenderness: There is no abdominal tenderness.  Musculoskeletal:     Cervical back: Normal range of motion. No rigidity.     Thoracic back: No bony tenderness.     Lumbar back: Tenderness present. No bony tenderness.       Back:  Skin:    General: Skin is warm and dry.  Neurological:     Mental Status: She is alert.     Comments: CN 3-12 grossly intact. 5/5 strength in all 4 extremities. Grossly normal sensation. Normal finger to nose.      ED Results / Procedures / Treatments   Labs (all labs ordered are listed, but only abnormal results are displayed) Labs Reviewed  BASIC METABOLIC PANEL - Abnormal; Notable for the following components:      Result Value   Sodium 134 (*)    Chloride 96 (*)    Glucose, Bld 182 (*)    All other components within normal limits  CBC - Abnormal; Notable for the following components:   Platelets 524 (*)    All other components within  normal limits  RAPID URINE DRUG SCREEN, HOSP PERFORMED - Abnormal; Notable for the following components:   Benzodiazepines POSITIVE (*)    Tetrahydrocannabinol POSITIVE (*)    All other components within normal limits  URINALYSIS, W/ REFLEX TO CULTURE (INFECTION SUSPECTED) - Abnormal; Notable for the following components:   Glucose, UA 50 (*)    Protein, ur 30 (*)    Leukocytes,Ua SMALL (*)    Bacteria, UA RARE (*)    All other components within normal limits  CBG MONITORING, ED - Abnormal; Notable for the following components:   Glucose-Capillary 187 (*)    All other components within normal limits  MAGNESIUM   ETHANOL  CBG MONITORING, ED    EKG EKG Interpretation Date/Time:  Tuesday October 28 2023 20:02:29 EST Ventricular Rate:  101 PR Interval:  156 QRS Duration:  87 QT Interval:  350 QTC Calculation: 454 R Axis:   81  Text Interpretation: Sinus tachycardia Biatrial enlargement no acute St/T changes Confirmed by Freddi Hamilton [id number] on 10/28/2023 9:23:37 PM  Radiology CT Head Wo Contrast Result Date: 10/28/2023 CLINICAL DATA:  Status post seizure. EXAM: CT HEAD WITHOUT CONTRAST TECHNIQUE: Contiguous axial images were obtained from the base of the skull through the vertex without intravenous contrast. RADIATION DOSE REDUCTION: This exam was performed according to the departmental dose-optimization program which includes automated exposure control, adjustment of the mA and/or kV according to patient size and/or use of iterative reconstruction technique. COMPARISON:  None Available. FINDINGS: Brain: No evidence of acute infarction, hemorrhage, hydrocephalus, extra-axial collection or mass lesion/mass effect. Chronic right parietal lobe infarcts are noted. Vascular: No hyperdense vessel or unexpected calcification. Skull: Normal. Negative for fracture or focal lesion. Sinuses/Orbits: No acute finding. Other: None. IMPRESSION: 1. No acute intracranial abnormality. 2. Chronic right  parietal lobe infarcts. Electronically Signed   By: Suzen Dials M.D.   On: 10/28/2023 22:28   DG Chest Portable 1 View Result Date: 10/28/2023 CLINICAL DATA:  seizure, right thoracic back injury EXAM: PORTABLE CHEST 1 VIEW COMPARISON:  Chest radiograph 09/10/2019 FINDINGS: The cardiomediastinal contours are normal. The lungs are clear. Pulmonary vasculature is normal. No consolidation, pleural effusion, or pneumothorax. No acute osseous abnormalities are seen. Chronic bilateral shoulder arthropathy. IMPRESSION: No active disease. Electronically Signed   By: Andrea Gasman M.D.   On: 10/28/2023 22:22    Procedures Procedures    Medications Ordered in ED Medications  valproate (DEPACON ) 1,000 mg in dextrose  5 % 50 mL IVPB (1,000 mg Intravenous New Bag/Given 10/28/23 2339)    ED Course/ Medical Decision Making/ A&P                                 Medical Decision Making Amount and/or Complexity of Data Reviewed Labs: ordered.    Details: Unremarkable electrolytes including normal magnesium .  UA appears contaminated. Radiology: ordered and independent interpretation performed.    Details: No head bleed ECG/medicine tests: ordered and independent interpretation performed.    Details: No ischemia  Risk OTC drugs. Prescription drug management.   Patient presents with what sounds like a seizure.  She otherwise does not appear to be having withdrawal from benzodiazepines as she has no tremor or other signs of withdrawal.  CT head shows what appears to be an old stroke though this seems to be asymptomatic.  Electrolytes unremarkable.  Discussed with neurology, Dr.K haliqdina.  He recommends 1000 mg load of IV Depakote , Depakote  500 mg twice daily as well as starting a baby aspirin  because of the stroke.  Will need outpatient EEG, stroke workup and neuro follow-up.  We also discussed seizure precautions.  However I think at this point she is stable for discharge and she will need to  follow-up with PCP and neurology.        Final Clinical Impression(s) / ED Diagnoses Final diagnoses:  Seizure (HCC)    Rx / DC Orders ED Discharge Orders  Ordered    divalproex  (DEPAKOTE ) 500 MG DR tablet  2 times daily        10/28/23 2305    aspirin  81 MG chewable tablet  Daily        10/28/23 2305    Ambulatory referral to Neurology       Comments: An appointment is requested in approximately: 2 weeks   10/28/23 2305              Freddi Hamilton, MD 10/28/23 2357

## 2023-10-29 NOTE — ED Notes (Signed)
 Reviewed D/C information with the patient, pt verbalized understanding. No additional concerns at this time.

## 2023-11-18 DIAGNOSIS — M5418 Radiculopathy, sacral and sacrococcygeal region: Secondary | ICD-10-CM | POA: Diagnosis not present

## 2023-11-18 DIAGNOSIS — G8929 Other chronic pain: Secondary | ICD-10-CM | POA: Diagnosis not present

## 2023-11-18 DIAGNOSIS — Z6826 Body mass index (BMI) 26.0-26.9, adult: Secondary | ICD-10-CM | POA: Diagnosis not present

## 2023-11-18 DIAGNOSIS — M818 Other osteoporosis without current pathological fracture: Secondary | ICD-10-CM | POA: Diagnosis not present

## 2023-11-18 DIAGNOSIS — F900 Attention-deficit hyperactivity disorder, predominantly inattentive type: Secondary | ICD-10-CM | POA: Diagnosis not present

## 2023-11-18 DIAGNOSIS — K5180 Other ulcerative colitis without complications: Secondary | ICD-10-CM | POA: Diagnosis not present

## 2023-11-18 DIAGNOSIS — F3132 Bipolar disorder, current episode depressed, moderate: Secondary | ICD-10-CM | POA: Diagnosis not present

## 2023-11-18 DIAGNOSIS — J4540 Moderate persistent asthma, uncomplicated: Secondary | ICD-10-CM | POA: Diagnosis not present

## 2023-11-24 ENCOUNTER — Encounter (INDEPENDENT_AMBULATORY_CARE_PROVIDER_SITE_OTHER): Payer: Self-pay | Admitting: *Deleted

## 2023-12-16 ENCOUNTER — Telehealth (INDEPENDENT_AMBULATORY_CARE_PROVIDER_SITE_OTHER): Payer: Self-pay | Admitting: *Deleted

## 2023-12-16 ENCOUNTER — Ambulatory Visit (INDEPENDENT_AMBULATORY_CARE_PROVIDER_SITE_OTHER): Admitting: Gastroenterology

## 2023-12-16 ENCOUNTER — Encounter (INDEPENDENT_AMBULATORY_CARE_PROVIDER_SITE_OTHER): Payer: Self-pay | Admitting: Gastroenterology

## 2023-12-16 ENCOUNTER — Other Ambulatory Visit (INDEPENDENT_AMBULATORY_CARE_PROVIDER_SITE_OTHER): Payer: Self-pay | Admitting: *Deleted

## 2023-12-16 VITALS — BP 117/78 | HR 99 | Temp 97.5°F | Ht 67.0 in | Wt 171.0 lb

## 2023-12-16 DIAGNOSIS — Z8673 Personal history of transient ischemic attack (TIA), and cerebral infarction without residual deficits: Secondary | ICD-10-CM

## 2023-12-16 DIAGNOSIS — K51911 Ulcerative colitis, unspecified with rectal bleeding: Secondary | ICD-10-CM | POA: Diagnosis not present

## 2023-12-16 DIAGNOSIS — K51011 Ulcerative (chronic) pancolitis with rectal bleeding: Secondary | ICD-10-CM | POA: Diagnosis not present

## 2023-12-16 DIAGNOSIS — M05711 Rheumatoid arthritis with rheumatoid factor of right shoulder without organ or systems involvement: Secondary | ICD-10-CM | POA: Insufficient documentation

## 2023-12-16 DIAGNOSIS — K921 Melena: Secondary | ICD-10-CM | POA: Insufficient documentation

## 2023-12-16 DIAGNOSIS — R569 Unspecified convulsions: Secondary | ICD-10-CM | POA: Insufficient documentation

## 2023-12-16 MED ORDER — BUDESONIDE 3 MG PO CPEP: 6.0000 mg | ORAL_CAPSULE | Freq: Every day | ORAL | 0 refills | Status: AC

## 2023-12-16 MED ORDER — MESALAMINE 4 G RE ENEM: ENEMA | RECTAL | 2 refills | Status: AC

## 2023-12-16 MED ORDER — MESALAMINE 1.2 G PO TBEC: 4.8000 g | DELAYED_RELEASE_TABLET | Freq: Every day | ORAL | 2 refills | Status: AC

## 2023-12-16 MED ORDER — MESALAMINE 1000 MG RE SUPP: 4000.0000 mg | Freq: Every day | RECTAL | 2 refills | Status: DC

## 2023-12-16 NOTE — Telephone Encounter (Signed)
 Called  [facility] and canceled suppositories and sent order for enemas. [place] [facility] was going to order the enemas.

## 2023-12-16 NOTE — Progress Notes (Signed)
 Vista Lawman , M.D. Gastroenterology & Hepatology [facility] Gastroenterology [address] [place], [place] [address] Primary Care Physician: Toma Deiters, MD [address] [place] [address]  Chief Complaint: Ulcerative colitis with diarrhea and rectal bleeding  History of Present Illness: Melanie Mendoza is a 53 y.o. female  with UC, history of PE of unknown etiology off AC (possible thrombophilia), who presents for evaluation of ulcerative colitis   Patient was last seen by Dr. Levon Hedger in 2021 and now reestablishing care here  Regarding her UC history she was diagnosed over 20 years ago where she took prednisone on around 2 or 3 years.  Reported 2017 had colonoscopy at [facility] where inflammation was found (cannot see those records) where after she was on sulfasalazine in 2017 intermittently.  Patient was last given Lialda 2.4 g in 2021 and was taking intermittently and stopped taking completely 2 months ago  Patient currently having 4-6 bloody bowel movement with liquid stool, associated with abdominal cramps and pain.  Patient reports having a stroke 2 months ago and a seizure as well and currently is not taking any medications.  Patient is very stressed and tearful in the clinic because of her medical issues  She is not following with any rheumatologist given the history of rheumatoid arthritis and have not seen any neurologist with CT finding of chronic ischemic infarct and recent stroke   Last labs from 10/2023 creatinine 0.57 hemoglobin 14.2 platelet 524  Last QMV:HQION Last Colonoscopy:  2021 The colon ( entire examined portion) appeared normal. These findings correspond to a Mayo 0 throughout the colon. Untargeted biopsies were taken with a cold forceps for histology every 10 cm - surveillance protocol for UC.  Non- bleeding internal hemorrhoids were found during retroflexion. The hemorrhoids were medium- sized. Impression: - The  examined portion of the ileum was normal. - The entire examined colon is normal. Biopsied. - Non- bleeding internal hemorrhoids.  Patient was started on Lialda 2. 4 g qday.  A. COLON, CECUM, BIOPSY: -  Benign colonic mucosa -  No active inflammation, dysplasia or malignancy identified  B. COLON, 70 CM, BIOPSY: -  Benign colonic mucosa -  No active inflammation, dysplasia or malignancy identified  C. COLON, 60 CM, BIOPSY: -  Benign colonic mucosa -  No active inflammation, dysplasia or malignancy identified  D. COLON, 50 CM, BIOPSY: -  Benign colonic mucosa -  No active inflammation, dysplasia or malignancy identified  E. COLON, 40 CM, BIOPSY: -  Benign colonic mucosa -  No active inflammation, dysplasia or malignancy identified  F. COLON, 30 CM, BIOPSY: -  Benign colonic mucosa -  No active inflammation, dysplasia or malignancy identified  G. COLON, 20 CM, BIOPSY: -  Benign colonic mucosa with hyperplastic changes -  No active inflammation, dysplasia or malignancy identified  H. 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 This patient is considered osteoporotic according to World Health Organization [facility]) criteria. The scan quality  is good. L3 and L4 were excluded due to advanced degenerative changes.  Past Medical History: Past Medical History:  Diagnosis Date   Acute pulmonary embolism (HCC) 09/13/2013   Asthma    Bilateral ovarian cysts    Hypertension     PE (pulmonary embolism) 09/2013   Polysubstance abuse (HCC)    Prediabetes 07/01/2016   Ulcerative colitis (HCC)     Past Surgical History: Past Surgical History:  Procedure Laterality Date   BIOPSY  08/15/2020   Procedure: BIOPSY;  Surgeon: Dolores Frame, MD;  Location: [facility];  Service: Gastroenterology;;  colon   COLONOSCOPY WITH PROPOFOL N/A 08/15/2020   Procedure: COLONOSCOPY WITH PROPOFOL;  Surgeon: Dolores Frame, MD;  Location: [facility];  Service: Gastroenterology;  Laterality: N/A;  1030   LAPAROSCOPIC APPENDECTOMY N/A 10/23/2016   Procedure: APPENDECTOMY LAPAROSCOPIC;  Surgeon: Franky Macho, MD;  Location: [facility] ORS;  Service: General;  Laterality: N/A;    Family History: Family History  Problem Relation Age of Onset   Lung cancer Mother    Cancer Mother     Social History: Social History   Tobacco Use  Smoking Status Never  Smokeless Tobacco Never   Social History   Substance and Sexual Activity  Alcohol Use No   Social History   Substance and Sexual Activity  Drug Use Yes   Types: Benzodiazepines   Comment: on suboxone    Allergies: No Known Allergies  Medications: Current Outpatient Medications  Medication Sig Dispense Refill   albuterol (PROVENTIL) (2.5 MG/3ML) 0.083% nebulizer solution Take 3 mLs (2.5 mg total) by nebulization every 6 (six) hours as needed for wheezing or shortness of breath. (Patient taking differently: Take 2.5 mg by nebulization daily.) 75 mL 1   albuterol (VENTOLIN HFA) 108 (90 Base) MCG/ACT inhaler Inhale 1-2 puffs into the lungs every 6 (six) hours as needed for wheezing or shortness of breath. 18 g 2   aspirin 81 MG chewable tablet Chew 1 tablet (81 mg total) by mouth daily. 30 tablet 1   buprenorphine (SUBUTEX) 8 MG SUBL SL tablet 8 mg 3 (three) times daily.     cetirizine (ZYRTEC) 10 MG tablet Take 10 mg by mouth daily.     furosemide (LASIX) 20 MG tablet Take 20 mg by mouth as needed.      gabapentin (NEURONTIN) 300 MG capsule Take 300 mg by mouth as needed.     amLODipine (NORVASC) 2.5 MG tablet Take 2.5 mg by mouth daily. (Patient not taking: Reported on 12/16/2023)     amphetamine-dextroamphetamine (ADDERALL) 20 MG tablet Take 20 mg by mouth 2 (two) times daily. (Patient not taking: Reported on 12/16/2023)     cloNIDine (CATAPRES) 0.1 MG tablet Take 0.1 mg by mouth at bedtime. (Patient not taking: Reported on 12/16/2023)     divalproex (DEPAKOTE) 500 MG DR tablet Take 1 tablet (500 mg total) by mouth 2 (two) times daily. (Patient not taking: Reported on 12/16/2023) 60 tablet 1   mesalamine (LIALDA) 1.2 g EC tablet Take 2 tablets (2.4 g total) by mouth daily with breakfast. (Patient not taking: Reported on 12/16/2023) 60 tablet 5   Omega-3 1000 MG CAPS Take 1,000 mg by mouth daily.     PARoxetine (PAXIL) 20 MG tablet Take 1 tablet (20 mg total) by mouth daily. 90 tablet 3   QUEtiapine (SEROQUEL) 100 MG tablet Take 50 mg by mouth at bedtime.     SYMBICORT 160-4.5 MCG/ACT inhaler Inhale 2 puffs into the lungs  2 (two) times daily.     triamcinolone cream (KENALOG) 0.1 % Apply 1 Application topically 2 (two) times daily. 80 g 0   venlafaxine (EFFEXOR) 25 MG tablet Take 25 mg by mouth once.     No current facility-administered medications for this visit.    Review of Systems: GENERAL: negative for malaise, night sweats HEENT: No changes in hearing or vision, no nose bleeds or other nasal problems. NECK: Negative for lumps, goiter, pain and significant neck swelling RESPIRATORY: Negative for cough, wheezing CARDIOVASCULAR: Negative for chest pain, leg swelling, palpitations, orthopnea GI: SEE HPI MUSCULOSKELETAL: Negative for joint pain or swelling, back pain, and muscle pain. SKIN: Negative for lesions, rash HEMATOLOGY Negative for prolonged bleeding, bruising easily, and swollen nodes. ENDOCRINE: Negative for cold or heat intolerance, polyuria, polydipsia and goiter. NEURO: negative  for tremor, gait imbalance, syncope and seizures. The remainder of the review of systems is noncontributory.   Physical Exam: LMP 04/28/2019  GENERAL: The patient is AO x3, in no acute distress. HEENT: Head is normocephalic and atraumatic. EOMI are intact. Mouth is well hydrated and without lesions. NECK: Supple. No masses LUNGS: Clear to auscultation. No presence of rhonchi/wheezing/rales. Adequate chest expansion HEART: RRR, normal s1 and s2. ABDOMEN: Soft, nontender, no guarding, no peritoneal signs, and nondistended. BS +. No masses.   Imaging/Labs: as above     Latest Ref Rng & Units 10/28/2023    8:00 PM 01/10/2023    4:46 PM 07/27/2020   12:00 AM  CBC  WBC 4.0 - 10.5 K/uL 6.8  14.3  5.0   Hemoglobin 12.0 - 15.0 g/dL 44.0  34.7  42.5   Hematocrit 36.0 - 46.0 % 43.4  44.6  39.9   Platelets 150 - 400 K/uL 524  683  373    No results found for: "IRON", "TIBC", "FERRITIN"  I personally reviewed and interpreted the available labs, imaging and endoscopic files.  Impression and Plan:  Melanie Mendoza is a 53 y.o. female  with UC, history of PE of unknown etiology off AC (possible thrombophilia), who presents for evaluation of ulcerative colitis    #Rectal Bleeding Secondary to UC : Moderate to Severe #Seizures/Chronic infarcts #Rheumatoid arthritis   Patient having 4-6 bloody bowel movement a day which is considered moderate to severe ulcerative colitis just with clinical history  Last colonoscopy 2021 and at this time unable to assess disease severity without a colonoscopy  Patient was last on Lialda 2.4 g and stopped taking few months ago because of seizure and stroke.  Mesalamine is not associated with seizures as per literature  Recommendaions:  -Will give full mesalamine challenge with Lialda 4.8 g and Rowasa 4 g daily at night  -Will check C.Diff PCR and GI PCR and than  Given severity of symptoms we will give 30-day supply of steroids, budesonide  daily  -Colonoscopy to assess disease activity after neurology clearance  -Check CBC, CMP, Mg, P, B12, Vitamin D, CRP, fecal Calprotectin   -Follow-up with PCP for complete vaccination including shingles, pneumonia, flu  -No absolute GI contraindication to start antiplatelet or anticoagulation if clinically  indicated given history of recurrent strokes and possible DVTs, while patient is closely monitored  Goal of management for this patient is to Induce "deep" remission, Improve endoscopic outcomes,Avoid short- and long-term toxicity of treatment, Maintain steroid-free remission Prevent or reduce complications  and Enhance patient-reported outcomes  -Patient has osteoporosis with last DEXA scan and recommend following up with an endocrinologist for possible bisphosphonate  treatment  -Will refer patient to neurologist on urgent basis given recent history of seizures and CT finding of chronic infarcts  -Will also refer the patient to rheumatologist for management of rheumatoid arthritis.  If diagnosed with RA may start DMARD which could comfortably will treat both UC and RA such as infliximab  All questions were answered.      Vista Lawman, MD Gastroenterology and Hepatology [facility] Gastroenterology   This chart has been completed using [facility] Dictation software, and while attempts have been made to ensure accuracy , certain words and phrases may not be transcribed as intended

## 2023-12-16 NOTE — Addendum Note (Signed)
 Addended by: Metro Kung on: 12/16/2023 10:50 AM   Modules accepted: Orders

## 2023-12-16 NOTE — Patient Instructions (Signed)
 It was very nice to meet you today, as dicussed with will plan for the following :  1) See a neurologist and Rheumatologist  2) Labwork and stool studies  3) Colonoscopy after seeing a neurologist

## 2023-12-16 NOTE — Telephone Encounter (Signed)
 Pharmacist Sharyl Nimrod from [facility] called to clarify mesalamine supp order. Order sent  for 4 suppositories at bedtime and her concern is the typical  dose is 1000mg  one supp at bedtime [phone].

## 2023-12-16 NOTE — Telephone Encounter (Signed)
 It is 4gm at night , so 4000mg  , Mesalaine rectal enemas ( Rowasa)

## 2023-12-17 ENCOUNTER — Encounter: Payer: Self-pay | Admitting: Neurology

## 2023-12-17 DIAGNOSIS — K921 Melena: Secondary | ICD-10-CM | POA: Diagnosis not present

## 2023-12-17 DIAGNOSIS — K51011 Ulcerative (chronic) pancolitis with rectal bleeding: Secondary | ICD-10-CM | POA: Diagnosis not present

## 2023-12-17 LAB — COMPREHENSIVE METABOLIC PANEL
ALT: 14 IU/L (ref 0–32)
AST: 20 IU/L (ref 0–40)
Albumin: 3.9 g/dL (ref 3.8–4.9)
Alkaline Phosphatase: 103 IU/L (ref 44–121)
BUN/Creatinine Ratio: 17 (ref 9–23)
BUN: 10 mg/dL (ref 6–24)
Bilirubin Total: 0.2 mg/dL (ref 0.0–1.2)
CO2: 29 mmol/L (ref 20–29)
Calcium: 9.8 mg/dL (ref 8.7–10.2)
Chloride: 96 mmol/L (ref 96–106)
Creatinine, Ser: 0.6 mg/dL (ref 0.57–1.00)
Globulin, Total: 3 g/dL (ref 1.5–4.5)
Glucose: 95 mg/dL (ref 70–99)
Potassium: 5 mmol/L (ref 3.5–5.2)
Sodium: 138 mmol/L (ref 134–144)
Total Protein: 6.9 g/dL (ref 6.0–8.5)
eGFR: 107 mL/min/{1.73_m2} (ref >59)

## 2023-12-17 LAB — CBC
Hematocrit: 39.6 % (ref 34.0–46.6)
Hemoglobin: 12.8 g/dL (ref 11.1–15.9)
MCH: 28.4 pg (ref 26.6–33.0)
MCHC: 32.3 g/dL (ref 31.5–35.7)
MCV: 88 fL (ref 79–97)
Platelets: 528 10*3/uL — ABNORMAL HIGH (ref 150–450)
RBC: 4.5 x10E6/uL (ref 3.77–5.28)
RDW: 13.2 % (ref 11.7–15.4)
WBC: 7.9 10*3/uL (ref 3.4–10.8)

## 2023-12-17 LAB — FERRITIN: Ferritin: 69 ng/mL (ref 15–150)

## 2023-12-17 LAB — C-REACTIVE PROTEIN: CRP: 13 mg/L — ABNORMAL HIGH (ref 0–10)

## 2023-12-17 LAB — VITAMIN B12: Vitamin B-12: 1821 pg/mL — ABNORMAL HIGH (ref 232–1245)

## 2023-12-17 LAB — VITAMIN D 25 HYDROXY (VIT D DEFICIENCY, FRACTURES): Vit D, 25-Hydroxy: 48.4 ng/mL (ref 30.0–100.0)

## 2023-12-18 ENCOUNTER — Telehealth (INDEPENDENT_AMBULATORY_CARE_PROVIDER_SITE_OTHER): Payer: Self-pay | Admitting: *Deleted

## 2023-12-18 NOTE — Telephone Encounter (Signed)
 I sent referral to [facility] Rheumatology and I rec'd a message back to day they don't take patient's insurance - who do you want me to refer her to now?

## 2023-12-19 LAB — GI PROFILE, STOOL, PCR

## 2023-12-19 LAB — CALPROTECTIN, FECAL: Calprotectin, Fecal: 240 ug/g — ABNORMAL HIGH (ref 0–120)

## 2023-12-21 LAB — C DIFFICILE, CYTOTOXIN B

## 2023-12-21 LAB — C DIFFICILE TOXINS A+B W/RFLX: C difficile Toxins A+B, EIA: NEGATIVE

## 2023-12-22 NOTE — Telephone Encounter (Signed)
 Ok, thanks.

## 2023-12-23 ENCOUNTER — Encounter (INDEPENDENT_AMBULATORY_CARE_PROVIDER_SITE_OTHER): Payer: Self-pay | Admitting: *Deleted

## 2023-12-23 NOTE — Progress Notes (Signed)
 Hi Wendy ,  Can you please call the patient and tell the patient the lab work shows inflammation in stool sample as this is likely because of ulcerative colitis . I recommend taking mesalamine oral and rectal enemas as prescribed and we look forward to seeing you in next few weeks. You may be recommend colonoscopy in future after seeing neurologist . If you have significant amount of blood movements or significant blood in stool at that time I would recommend coming to the ED  Thanks,  Vista Lawman, MD Gastroenterology and Hepatology [facility] Gastroenterology  =================  Fecal calprotectin 240 Hemoglobin 12.8 down trended from 14.2 previously platelet 528 CRP 13 Vitamin B12 1821 Negative C. Difficile Negative GI PCR Normal liver enzymes Vitamin D 48.4

## 2023-12-24 NOTE — Telephone Encounter (Signed)
 Left another message to return call since she has not called back from yesterday or read mychart message.

## 2023-12-26 NOTE — Telephone Encounter (Signed)
 Discussed with patient per Dr. Tasia Catchings lab work shows inflammation in stool sample. This is likely because of ulcerative colitis . He recommends taking mesalamine oral and rectal enemas as prescribed and he looks forward to seeing you in next few weeks. You may be recommend colonoscopy in future after seeing neurologist . If you have significant amount of blood movements or significant blood in stool at that time I would recommend going to the Emergency room.  Patient verbalized understanding.

## 2024-01-07 ENCOUNTER — Encounter: Payer: Self-pay | Admitting: Neurology

## 2024-01-07 ENCOUNTER — Ambulatory Visit: Admitting: Neurology

## 2024-01-07 ENCOUNTER — Other Ambulatory Visit

## 2024-01-07 VITALS — BP 125/83 | HR 97 | Ht 67.0 in | Wt 167.0 lb

## 2024-01-07 DIAGNOSIS — Z8673 Personal history of transient ischemic attack (TIA), and cerebral infarction without residual deficits: Secondary | ICD-10-CM

## 2024-01-07 DIAGNOSIS — R569 Unspecified convulsions: Secondary | ICD-10-CM | POA: Diagnosis not present
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
## 2024-01-07 NOTE — Patient Instructions (Addendum)
 Good to meet you.  Schedule MRI brain with and without contrast  2. Schedule 1-hour EEG  3. Schedule carotid ultrasound  4. Schedule echocardiogram  5. Bloodwork will be done for lipid panel, HbA1c  6. Continue daily aspirin 81mg  tablet  7. Follow-up in 3 months, call for any changes   Seizure precautions: 1. If medication has been prescribed for you to prevent seizures, take it exactly as directed.  Do not stop taking the medicine without talking to your doctor first, even if you have not had a seizure in a long time.   2. Avoid activities in which a seizure would cause danger to yourself or to others.  Don't operate dangerous machinery, swim alone, or climb in high or dangerous places, such as on ladders, roofs, or girders.  Do not drive unless your doctor says you may.  3. If you have any warning that you may have a seizure, lay down in a safe place where you can't hurt yourself.    4.  No driving for 6 months from last seizure, as per [facility].   Please refer to the following link on the Epilepsy Foundation of America's website for more information: http://www.epilepsyfoundation.org/answerplace/Social/driving/drivingu.cfm   5.  Maintain good sleep hygiene. Avoid alcohol.  6.  Contact your doctor if you have any problems that may be related to the medicine you are taking.  7.  Call 911 and bring the patient back to the ED if:        A.  The seizure lasts longer than 5 minutes.       B.  The patient doesn't awaken shortly after the seizure  C.  The patient has new problems such as difficulty seeing, speaking or moving  D.  The patient was injured during the seizure  E.  The patient has a temperature over 102 F (39C)  F.  The patient vomited and now is having trouble breathing

## 2024-01-08 ENCOUNTER — Other Ambulatory Visit

## 2024-01-08 LAB — HEMOGLOBIN A1C
Hgb A1c MFr Bld: 6.6 %{Hb} — ABNORMAL HIGH (ref <5.7)
Mean Plasma Glucose: 143 mg/dL
eAG (mmol/L): 7.9 mmol/L

## 2024-01-08 LAB — LIPID PANEL
Cholesterol: 294 mg/dL — ABNORMAL HIGH (ref <200)
HDL: 58 mg/dL (ref >=50)
LDL Cholesterol (Calc): 196 mg/dL — ABNORMAL HIGH
Non-HDL Cholesterol (Calc): 236 mg/dL — ABNORMAL HIGH (ref <130)
Total CHOL/HDL Ratio: 5.1 (calc) — ABNORMAL HIGH (ref <5.0)
Triglycerides: 213 mg/dL — ABNORMAL HIGH (ref <150)

## 2024-01-09 ENCOUNTER — Ambulatory Visit (INDEPENDENT_AMBULATORY_CARE_PROVIDER_SITE_OTHER): Payer: Medicaid Other | Admitting: Gastroenterology

## 2024-01-12 ENCOUNTER — Encounter: Payer: Self-pay | Admitting: Neurology

## 2024-01-12 ENCOUNTER — Other Ambulatory Visit

## 2024-01-15 ENCOUNTER — Telehealth: Payer: Self-pay

## 2024-01-15 DIAGNOSIS — F3132 Bipolar disorder, current episode depressed, moderate: Secondary | ICD-10-CM | POA: Diagnosis not present

## 2024-01-15 DIAGNOSIS — M818 Other osteoporosis without current pathological fracture: Secondary | ICD-10-CM | POA: Diagnosis not present

## 2024-01-15 DIAGNOSIS — J4540 Moderate persistent asthma, uncomplicated: Secondary | ICD-10-CM | POA: Diagnosis not present

## 2024-01-15 DIAGNOSIS — Z8673 Personal history of transient ischemic attack (TIA), and cerebral infarction without residual deficits: Secondary | ICD-10-CM | POA: Diagnosis not present

## 2024-01-15 DIAGNOSIS — Z6826 Body mass index (BMI) 26.0-26.9, adult: Secondary | ICD-10-CM | POA: Diagnosis not present

## 2024-01-15 DIAGNOSIS — F900 Attention-deficit hyperactivity disorder, predominantly inattentive type: Secondary | ICD-10-CM | POA: Diagnosis not present

## 2024-01-15 DIAGNOSIS — M5418 Radiculopathy, sacral and sacrococcygeal region: Secondary | ICD-10-CM | POA: Diagnosis not present

## 2024-01-15 DIAGNOSIS — K5180 Other ulcerative colitis without complications: Secondary | ICD-10-CM | POA: Diagnosis not present

## 2024-01-15 DIAGNOSIS — G8929 Other chronic pain: Secondary | ICD-10-CM | POA: Diagnosis not present

## 2024-01-15 DIAGNOSIS — G40911 Epilepsy, unspecified, intractable, with status epilepticus: Secondary | ICD-10-CM | POA: Diagnosis not present

## 2024-01-15 NOTE — Telephone Encounter (Signed)
 ECHO complete NO PA needed   US carotid [address] case number [id number] health plan to be determine  MRI with and with out contrast case number [id number] health plan to be determine

## 2024-01-20 ENCOUNTER — Telehealth (INDEPENDENT_AMBULATORY_CARE_PROVIDER_SITE_OTHER): Payer: Self-pay | Admitting: Gastroenterology

## 2024-01-20 NOTE — Telephone Encounter (Signed)
    01/20/24  Lai Hendriks Kras 11-04-1970  What type of surgery is being performed? Colonoscopy   When is surgery scheduled? TBD  What type of clearance is required (medical or pharmacy to hold medication or both? Neurology clearance  Name of physician performing surgery?  Dr. Starleen Arms [facility] Phone: [phone] Fax: [phone]  Anethesia type (none, local, MAC, general)? Choice

## 2024-01-28 ENCOUNTER — Encounter: Payer: Self-pay | Admitting: Neurology

## 2024-01-28 NOTE — Telephone Encounter (Signed)
 From the standpoint of seizures, there is no contraindication to medically necessary surgery. She is not on seizure medication at this time. From the standpoint of stroke, continue daily aspirin unless GI recommends otherwise.

## 2024-01-29 ENCOUNTER — Telehealth (INDEPENDENT_AMBULATORY_CARE_PROVIDER_SITE_OTHER): Payer: Self-pay | Admitting: Gastroenterology

## 2024-01-29 NOTE — Telephone Encounter (Signed)
 Attempted to reach pt to schedule TCS. No answer

## 2024-02-12 ENCOUNTER — Ambulatory Visit: Admitting: Neurology

## 2024-02-12 DIAGNOSIS — Z8673 Personal history of transient ischemic attack (TIA), and cerebral infarction without residual deficits: Secondary | ICD-10-CM

## 2024-02-12 DIAGNOSIS — R569 Unspecified convulsions: Secondary | ICD-10-CM | POA: Diagnosis not present

## 2024-02-12 NOTE — Progress Notes (Unsigned)
 EEG complete and ready for review.

## 2024-02-13 NOTE — Telephone Encounter (Signed)
Left message to return call. Sent my chart message

## 2024-02-18 NOTE — Procedures (Signed)
 ELECTROENCEPHALOGRAM REPORT  Date of Study: 02/12/2024  Patient's Name: Melanie Mendoza MRN: [id number] Date of Birth: January 04, 1971  Referring Provider: Dr. Rayfield Cairo  Clinical History: This is a 53 year old woman with new onset seizure.  Medications: Gabapentin , Subutex  Technical Summary: A multichannel digital 1-hour EEG recording measured by the international 10-20 system with electrodes applied with paste and impedances below 5000 ohms performed in our laboratory with EKG monitoring in an awake and asleep patient.  Hyperventilation was not performed. Photic stimulation was performed.  The digital EEG was referentially recorded, reformatted, and digitally filtered in a variety of bipolar and referential montages for optimal display.    Description: The patient is awake and asleep during the recording.  During maximal wakefulness, there is a symmetric, medium voltage 8-9 Hz posterior dominant rhythm that attenuates with eye opening.  The record is symmetric.  There is an excess amount of diffuse low voltage beta activity seen throughout the recording. During drowsiness and stage I sleep, there is an increase in theta slowing of the background with occasional vertex waves seen.  Photic stimulation did not elicit any abnormalities.  There were no epileptiform discharges or electrographic seizures seen.    EKG lead was unremarkable.  Impression: This 1-hour awake and asleep EEG is normal except for excess amount of diffuse low voltage beta activity.  Clinical Correlation: Diffuse low voltage beta activity is commonly seen with sedating medications such as benzodiazepines.  In the absence of sedating medications, anxiety and hyperthyroidism may produce generalized beta activity.  The absence of epileptiform discharges does not exclude a clinical diagnosis of epilepsy.  If further clinical questions remain, prolonged EEG may be helpful.  Clinical correlation is advised.   Rayfield Cairo,  M.D.

## 2024-02-18 NOTE — Telephone Encounter (Signed)
 Pt left voicemail to schedule procedure. Returned call to pt but had to leave message to return call.  ASA 3 Melanie Mendoza

## 2024-02-24 ENCOUNTER — Ambulatory Visit: Payer: Self-pay

## 2024-02-24 NOTE — Telephone Encounter (Signed)
-----   Message from Jhonny Moss sent at 02/19/2024  8:42 PM EDT ----- Pls let her know EEG is normal, proceed with brain MRI as discussed, thanks

## 2024-02-24 NOTE — Telephone Encounter (Signed)
 Pt called an informed that EEG is normal, proceed with brain MRI as discussed pt scheduled for MRI at [facility] May 19th at [facility]

## 2024-02-25 NOTE — Telephone Encounter (Signed)
 Left message to return call on home number. Called pt on cell number-sounded as if someone picked up but I could not hear anyone.

## 2024-02-26 NOTE — Telephone Encounter (Signed)
 Attempted to reach pt, again it sounded like someone picked up but I was unable to hear anything.

## 2024-03-01 ENCOUNTER — Ambulatory Visit (HOSPITAL_COMMUNITY): Admission: RE | Admit: 2024-03-01 | Source: Ambulatory Visit

## 2024-03-15 ENCOUNTER — Ambulatory Visit (INDEPENDENT_AMBULATORY_CARE_PROVIDER_SITE_OTHER): Admitting: Gastroenterology

## 2024-03-26 DIAGNOSIS — M818 Other osteoporosis without current pathological fracture: Secondary | ICD-10-CM | POA: Diagnosis not present

## 2024-03-26 DIAGNOSIS — M5418 Radiculopathy, sacral and sacrococcygeal region: Secondary | ICD-10-CM | POA: Diagnosis not present

## 2024-03-26 DIAGNOSIS — F900 Attention-deficit hyperactivity disorder, predominantly inattentive type: Secondary | ICD-10-CM | POA: Diagnosis not present

## 2024-03-26 DIAGNOSIS — G8929 Other chronic pain: Secondary | ICD-10-CM | POA: Diagnosis not present

## 2024-03-26 DIAGNOSIS — F3132 Bipolar disorder, current episode depressed, moderate: Secondary | ICD-10-CM | POA: Diagnosis not present

## 2024-03-26 DIAGNOSIS — Z8673 Personal history of transient ischemic attack (TIA), and cerebral infarction without residual deficits: Secondary | ICD-10-CM | POA: Diagnosis not present

## 2024-03-26 DIAGNOSIS — G40911 Epilepsy, unspecified, intractable, with status epilepticus: Secondary | ICD-10-CM | POA: Diagnosis not present

## 2024-03-26 DIAGNOSIS — J4540 Moderate persistent asthma, uncomplicated: Secondary | ICD-10-CM | POA: Diagnosis not present

## 2024-03-26 DIAGNOSIS — K5180 Other ulcerative colitis without complications: Secondary | ICD-10-CM | POA: Diagnosis not present

## 2024-04-07 ENCOUNTER — Ambulatory Visit: Admitting: Neurology

## 2024-04-08 ENCOUNTER — Encounter: Payer: Self-pay | Admitting: Neurology

## 2024-04-08 ENCOUNTER — Ambulatory Visit (HOSPITAL_COMMUNITY): Admission: RE | Admit: 2024-04-08 | Source: Ambulatory Visit

## 2024-04-14 ENCOUNTER — Telehealth: Payer: Self-pay | Admitting: Neurology

## 2024-04-14 NOTE — Telephone Encounter (Signed)
 Left a message with the after hour service on 04-14-24  Caller states that patient was a no show for her appt on 04-08-24 for echo-caridogram complete bubble study. Callers has made several attempts to reach out to the patient with out any success. Caller is calling from [facility]  ultrasound to see if Dr Georjean wants patient to try to reach patient about appt

## 2024-04-14 NOTE — Telephone Encounter (Signed)
 Pls call patient and see how she is doing, she no-showed her appt with me last month also, in addition to n/s MRI, echo. If unable to reach, please mail letter, thanks

## 2024-04-14 NOTE — Telephone Encounter (Signed)
 Agree, thanks

## 2024-04-14 NOTE — Telephone Encounter (Signed)
 Called pts cousin who is on the DPR she stated that Melanie Mendoza has been very tired and sleeping 12 to 14 hours a day. She stated that she has been stuttering. She stated thats she didn't know she had been missing appointments she said that she is going to help get her rescheduled for her missed appointments and she is going to bring her. Pt cousin advised that if pt is having increase in symptoms to take her to the ER to be evaluated ,

## 2024-04-19 DIAGNOSIS — Z6827 Body mass index (BMI) 27.0-27.9, adult: Secondary | ICD-10-CM | POA: Diagnosis not present

## 2024-04-19 DIAGNOSIS — Z0000 Encounter for general adult medical examination without abnormal findings: Secondary | ICD-10-CM | POA: Diagnosis not present

## 2024-04-22 ENCOUNTER — Ambulatory Visit (HOSPITAL_COMMUNITY): Admission: RE | Admit: 2024-04-22 | Source: Ambulatory Visit

## 2024-04-29 ENCOUNTER — Ambulatory Visit (HOSPITAL_COMMUNITY)
Admission: RE | Admit: 2024-04-29 | Discharge: 2024-04-29 | Disposition: A | Discharge status: Home / Self Care | Source: Ambulatory Visit | Attending: Neurology | Admitting: Neurology

## 2024-04-29 DIAGNOSIS — I639 Cerebral infarction, unspecified: Secondary | ICD-10-CM | POA: Diagnosis not present

## 2024-04-29 DIAGNOSIS — R569 Unspecified convulsions: Secondary | ICD-10-CM | POA: Insufficient documentation

## 2024-04-29 DIAGNOSIS — Z8673 Personal history of transient ischemic attack (TIA), and cerebral infarction without residual deficits: Secondary | ICD-10-CM | POA: Diagnosis not present

## 2024-04-29 MED ORDER — GADOBUTROL 1 MMOL/ML IV SOLN
7.5000 mL | Freq: Once | INTRAVENOUS | Status: AC | PRN
  Administered 2024-04-29: 7.5 mL via INTRAVENOUS

## 2024-05-04 ENCOUNTER — Telehealth: Payer: Self-pay | Admitting: Neurology

## 2024-05-04 NOTE — Telephone Encounter (Signed)
 Pt said she is returning a call to [employer]

## 2024-05-11 ENCOUNTER — Ambulatory Visit: Admitting: Adult Health

## 2024-07-07 ENCOUNTER — Telehealth: Payer: Self-pay | Admitting: Neurology

## 2024-07-07 NOTE — Telephone Encounter (Signed)
 How frequent or the headaches (on average, how many days a week/month are they occurring)?  daily How long do the headaches last?  1 week Verify what preventative medication and dose you are taking (e.g. topiramate, propranolol, amitriptyline, Emgality, etc)  subutex? Verify which rescue medication you are taking (triptan, Advil , Excedrin, Aleve, Ubrelvy, etc)  tylenol  How often are you taking pain relievers/analgesics/rescue mediction?  Every 6 hrs   Pt stated--dizziness, blurry vision-started yesterday. Last seizure this year.pt requesting  CT scan result.

## 2024-07-07 NOTE — Telephone Encounter (Signed)
 Patient states that having headache daily and is now stuttering which both is not like her. She would like to speak to someone

## 2024-07-08 NOTE — Telephone Encounter (Signed)
 Patient scheduled for 07/14/24 to discuss

## 2024-07-14 ENCOUNTER — Encounter: Payer: Self-pay | Admitting: Neurology

## 2024-07-14 ENCOUNTER — Ambulatory Visit: Admitting: Neurology

## 2024-07-14 VITALS — BP 145/88 | HR 87 | Ht 67.0 in | Wt 178.0 lb

## 2024-07-14 DIAGNOSIS — Z8673 Personal history of transient ischemic attack (TIA), and cerebral infarction without residual deficits: Secondary | ICD-10-CM

## 2024-07-14 DIAGNOSIS — R519 Headache, unspecified: Secondary | ICD-10-CM | POA: Diagnosis not present

## 2024-07-14 DIAGNOSIS — R569 Unspecified convulsions: Secondary | ICD-10-CM | POA: Diagnosis not present

## 2024-07-14 MED ORDER — LAMOTRIGINE 25 MG PO TABS: ORAL_TABLET | ORAL | 6 refills | Status: AC
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
## 2024-07-14 NOTE — Patient Instructions (Addendum)
 Good to see you.  Schedule head CT without contrast at [facility]  2. Please call Cardiology to schedule the echocardiogram and carotid ultrasound at  [phone]  3. Increase Lamotrigine 25mg : take 2 tablets twice a day  4. Start daily aspirin  81mg  tablet  5. Discuss cholesterol medication with Dr. Orpha  6. Consider sleep study in the future  7. Follow-up in 3 months, call for any changes

## 2024-07-28 ENCOUNTER — Encounter (INDEPENDENT_AMBULATORY_CARE_PROVIDER_SITE_OTHER): Payer: Self-pay | Admitting: Gastroenterology

## 2024-07-29 ENCOUNTER — Ambulatory Visit (HOSPITAL_COMMUNITY)
Admission: RE | Admit: 2024-07-29 | Discharge: 2024-07-29 | Disposition: A | Discharge status: Home / Self Care | Source: Ambulatory Visit | Attending: Neurology | Admitting: Neurology

## 2024-07-29 DIAGNOSIS — R519 Headache, unspecified: Secondary | ICD-10-CM

## 2024-08-05 DIAGNOSIS — Z8673 Personal history of transient ischemic attack (TIA), and cerebral infarction without residual deficits: Secondary | ICD-10-CM | POA: Diagnosis not present

## 2024-08-05 DIAGNOSIS — G40911 Epilepsy, unspecified, intractable, with status epilepticus: Secondary | ICD-10-CM | POA: Diagnosis not present

## 2024-08-05 DIAGNOSIS — E782 Mixed hyperlipidemia: Secondary | ICD-10-CM | POA: Diagnosis not present

## 2024-08-05 DIAGNOSIS — M5418 Radiculopathy, sacral and sacrococcygeal region: Secondary | ICD-10-CM | POA: Diagnosis not present

## 2024-08-05 DIAGNOSIS — J4540 Moderate persistent asthma, uncomplicated: Secondary | ICD-10-CM | POA: Diagnosis not present

## 2024-08-05 DIAGNOSIS — F900 Attention-deficit hyperactivity disorder, predominantly inattentive type: Secondary | ICD-10-CM | POA: Diagnosis not present

## 2024-08-05 DIAGNOSIS — Z6826 Body mass index (BMI) 26.0-26.9, adult: Secondary | ICD-10-CM | POA: Diagnosis not present

## 2024-08-05 DIAGNOSIS — F3132 Bipolar disorder, current episode depressed, moderate: Secondary | ICD-10-CM | POA: Diagnosis not present

## 2024-08-05 DIAGNOSIS — K5180 Other ulcerative colitis without complications: Secondary | ICD-10-CM | POA: Diagnosis not present

## 2024-08-05 DIAGNOSIS — G8929 Other chronic pain: Secondary | ICD-10-CM | POA: Diagnosis not present

## 2024-08-05 DIAGNOSIS — M818 Other osteoporosis without current pathological fracture: Secondary | ICD-10-CM | POA: Diagnosis not present

## 2024-08-24 ENCOUNTER — Encounter: Payer: Self-pay | Admitting: Neurology

## 2024-08-27 ENCOUNTER — Encounter: Payer: Self-pay | Admitting: Neurology

## 2024-11-10 ENCOUNTER — Encounter: Payer: Self-pay | Admitting: Neurology

## 2024-11-10 ENCOUNTER — Ambulatory Visit: Admitting: Neurology

## 2025-01-11 ENCOUNTER — Ambulatory Visit: Admitting: Neurology

## 2025-01-25 ENCOUNTER — Encounter: Admitting: Adult Health
